# Patient Record
Sex: Male | Born: 1955
Health system: Southern US, Community
[De-identification: ages and names within clinical notes are randomized; demographics above are authoritative.]

## PROBLEM LIST (undated history)

## (undated) DIAGNOSIS — I428 Other cardiomyopathies: Secondary | ICD-10-CM

## (undated) DIAGNOSIS — I10 Essential (primary) hypertension: Secondary | ICD-10-CM

## (undated) DIAGNOSIS — J449 Chronic obstructive pulmonary disease, unspecified: Secondary | ICD-10-CM

## (undated) DIAGNOSIS — N189 Chronic kidney disease, unspecified: Secondary | ICD-10-CM

## (undated) DIAGNOSIS — I429 Cardiomyopathy, unspecified: Secondary | ICD-10-CM

## (undated) DIAGNOSIS — E119 Type 2 diabetes mellitus without complications: Secondary | ICD-10-CM

## (undated) DIAGNOSIS — D649 Anemia, unspecified: Secondary | ICD-10-CM

## (undated) DIAGNOSIS — N400 Enlarged prostate without lower urinary tract symptoms: Secondary | ICD-10-CM

## (undated) DIAGNOSIS — I509 Heart failure, unspecified: Secondary | ICD-10-CM

## (undated) HISTORY — DX: Benign prostatic hyperplasia without lower urinary tract symptoms: N40.0

## (undated) HISTORY — DX: Heart failure, unspecified: I50.9

## (undated) HISTORY — DX: Chronic kidney disease, unspecified: N18.9

## (undated) HISTORY — DX: Essential (primary) hypertension: I10

## (undated) HISTORY — DX: Anemia, unspecified: D64.9

## (undated) HISTORY — PX: TONSILLECTOMY: SUR1361

## (undated) HISTORY — PX: APPENDECTOMY: SHX54

## (undated) HISTORY — DX: Type 2 diabetes mellitus without complications: E11.9

## (undated) HISTORY — DX: Chronic obstructive pulmonary disease, unspecified: J44.9

---

## 2018-11-27 ENCOUNTER — Other Ambulatory Visit: Payer: Self-pay

## 2018-11-27 ENCOUNTER — Encounter: Payer: Self-pay | Admitting: Internal Medicine

## 2018-11-28 ENCOUNTER — Inpatient Hospital Stay: Payer: Self-pay

## 2018-11-28 ENCOUNTER — Inpatient Hospital Stay: Payer: Self-pay | Attending: Internal Medicine | Admitting: Internal Medicine

## 2018-11-28 ENCOUNTER — Encounter: Payer: Self-pay | Admitting: Internal Medicine

## 2018-11-28 ENCOUNTER — Encounter (INDEPENDENT_AMBULATORY_CARE_PROVIDER_SITE_OTHER): Payer: Self-pay

## 2018-11-28 ENCOUNTER — Other Ambulatory Visit: Payer: Self-pay

## 2018-11-28 VITALS — BP 167/90 | HR 84 | Temp 98.7°F | Resp 18 | Wt 220.0 lb

## 2018-11-28 DIAGNOSIS — R0602 Shortness of breath: Secondary | ICD-10-CM | POA: Insufficient documentation

## 2018-11-28 DIAGNOSIS — N183 Chronic kidney disease, stage 3 (moderate): Secondary | ICD-10-CM | POA: Insufficient documentation

## 2018-11-28 DIAGNOSIS — D649 Anemia, unspecified: Secondary | ICD-10-CM

## 2018-11-28 DIAGNOSIS — I129 Hypertensive chronic kidney disease with stage 1 through stage 4 chronic kidney disease, or unspecified chronic kidney disease: Secondary | ICD-10-CM | POA: Insufficient documentation

## 2018-11-28 DIAGNOSIS — R1012 Left upper quadrant pain: Secondary | ICD-10-CM | POA: Insufficient documentation

## 2018-11-28 DIAGNOSIS — R634 Abnormal weight loss: Secondary | ICD-10-CM | POA: Insufficient documentation

## 2018-11-28 DIAGNOSIS — Z79899 Other long term (current) drug therapy: Secondary | ICD-10-CM | POA: Insufficient documentation

## 2018-11-28 DIAGNOSIS — R194 Change in bowel habit: Secondary | ICD-10-CM

## 2018-11-28 DIAGNOSIS — M7989 Other specified soft tissue disorders: Secondary | ICD-10-CM | POA: Insufficient documentation

## 2018-11-28 DIAGNOSIS — E119 Type 2 diabetes mellitus without complications: Secondary | ICD-10-CM | POA: Insufficient documentation

## 2018-11-28 DIAGNOSIS — F17210 Nicotine dependence, cigarettes, uncomplicated: Secondary | ICD-10-CM | POA: Insufficient documentation

## 2018-11-28 DIAGNOSIS — R1011 Right upper quadrant pain: Secondary | ICD-10-CM | POA: Insufficient documentation

## 2018-11-28 DIAGNOSIS — R1010 Upper abdominal pain, unspecified: Secondary | ICD-10-CM

## 2018-11-28 LAB — CBC WITH DIFFERENTIAL/PLATELET
Abs Immature Granulocytes: 0.02 10*3/uL (ref 0.00–0.07)
Basophils Absolute: 0.1 10*3/uL (ref 0.0–0.1)
Basophils Relative: 1 %
Eosinophils Absolute: 0.4 10*3/uL (ref 0.0–0.5)
Eosinophils Relative: 7 %
HCT: 28.2 % — ABNORMAL LOW (ref 39.0–52.0)
Hemoglobin: 8.9 g/dL — ABNORMAL LOW (ref 13.0–17.0)
Immature Granulocytes: 0 %
Lymphocytes Relative: 26 %
Lymphs Abs: 1.7 10*3/uL (ref 0.7–4.0)
MCH: 28 pg (ref 26.0–34.0)
MCHC: 31.6 g/dL (ref 30.0–36.0)
MCV: 88.7 fL (ref 80.0–100.0)
Monocytes Absolute: 0.4 10*3/uL (ref 0.1–1.0)
Monocytes Relative: 7 %
Neutro Abs: 3.8 10*3/uL (ref 1.7–7.7)
Neutrophils Relative %: 59 %
Platelets: 202 10*3/uL (ref 150–400)
RBC: 3.18 MIL/uL — ABNORMAL LOW (ref 4.22–5.81)
RDW: 14 % (ref 11.5–15.5)
WBC: 6.3 10*3/uL (ref 4.0–10.5)
nRBC: 0 % (ref 0.0–0.2)

## 2018-11-28 LAB — LACTATE DEHYDROGENASE: LDH: 232 U/L — ABNORMAL HIGH (ref 98–192)

## 2018-11-28 LAB — C-REACTIVE PROTEIN: CRP: <0.8 mg/dL (ref <1.0)

## 2018-11-28 LAB — FOLATE: Folate: 12.7 ng/mL (ref >5.9)

## 2018-11-28 NOTE — Progress Notes (Signed)
[facility] NOTE  Patient Care Team: [name], MD as PCP - General (Internal Medicine)  CHIEF COMPLAINTS/PURPOSE OF CONSULTATION: Anemia  HEMATOLOGY HISTORY  # SEP 2020 ANEMIA Hb-9;N-wbc/platelets MCV- normal; M- spike NEg/b12-N; retic-N [pcp]  #September 2020-PCP; 2D echo ejection fraction 25%/left ventricular hypokinesis; awaiting cardiac evaluation  # CKD-III [GFR-49]/ DM-2 [sep 2020-A1c-6.7];  EGD-/ colonoscopy/capsule/ Bone marrow Biopsy-NONE  HISTORY OF PRESENTING ILLNESS:  Shawn Hayes 63 y.o.  male has been referred to [place] for further evaluation/work-up for anemia.  Patient notes to have worsening shortness of breath for the last 2 months.  Positive for orthopnea.  Also complains of bilateral extremity swelling.  Complains of bilateral upper abdominal pain progressive getting worse.  Possible constipation alternating diarrhea-again going on for the last many months.  Patient last bowel movement was 3 days ago.  About a month ago he noticed some blood in stools.  Patient never had EGD or colonoscopy.  Patient initially lost weight he is currently gaining weight.  Is not on iron supplementation.  No prior blood transfusions.  Denies any blood in urine.  Denies difficulty swallowing.   Review of Systems  Constitutional: Positive for malaise/fatigue. Negative for chills, diaphoresis, fever and weight loss.  HENT: Negative for nosebleeds and sore throat.   Eyes: Negative for double vision.  Respiratory: Positive for cough and shortness of breath. Negative for hemoptysis, sputum production and wheezing.   Cardiovascular: Positive for orthopnea, leg swelling and PND. Negative for chest pain and palpitations.  Gastrointestinal: Positive for abdominal pain. Negative for blood in stool, constipation, diarrhea, heartburn, melena, nausea and vomiting.  Genitourinary: Positive for frequency and urgency.  Musculoskeletal: Negative for back pain and  joint pain.  Skin: Negative.  Negative for itching and rash.  Neurological: Positive for weakness. Negative for dizziness, tingling, focal weakness and headaches.  Endo/Heme/Allergies: Does not bruise/bleed easily.  Psychiatric/Behavioral: Negative for depression. The patient is not nervous/anxious and does not have insomnia.     MEDICAL HISTORY:  Past Medical History:  Diagnosis Date  . Anemia   . Controlled type 2 diabetes mellitus without complication ([place])     SURGICAL HISTORY: Past Surgical History:  Procedure Laterality Date  . APPENDECTOMY    . TONSILLECTOMY      SOCIAL HISTORY: Social History   Socioeconomic History  . Marital status: Single    Spouse name: Not on file  . Number of children: Not on file  . Years of education: Not on file  . Highest education level: Not on file  Occupational History  . Not on file  Social Needs  . Financial resource strain: Not on file  . Food insecurity    Worry: Not on file    Inability: Not on file  . Transportation needs    Medical: Not on file    Non-medical: Not on file  Tobacco Use  . Smoking status: Current Every Day Smoker    Types: Cigarettes  . Tobacco comment: Pt states he quit 1 week ago  Substance and Sexual Activity  . Alcohol use: Not Currently  . Drug use: Not Currently  . Sexual activity: Not on file  Lifestyle  . Physical activity    Days per week: Not on file    Minutes per session: Not on file  . Stress: Not on file  Relationships  . Social Herbalist on phone: Not on file    Gets together: Not on file    Attends  religious service: Not on file    Active member of club or organization: Not on file    Attends meetings of clubs or organizations: Not on file    Relationship status: Not on file  . Intimate partner violence    Fear of current or ex partner: Not on file    Emotionally abused: Not on file    Physically abused: Not on file    Forced sexual activity: Not on file  Other  Topics Concern  . Not on file  Social History Narrative   Lives in [place]; self; one son lives close by; in Haematologist currently in retail. Quit smoking- sep 28th, 2020/80ppd. No alcohol.     FAMILY HISTORY: Family History  Problem Relation Age of Onset  . Cancer Brother     ALLERGIES:  has No Known Allergies.  MEDICATIONS:  Current Outpatient Medications  Medication Sig Dispense Refill  . furosemide (LASIX) 40 MG tablet Take 1 tablet by mouth daily.    Marland Kitchen glipiZIDE-metformin (METAGLIP) 5-500 MG tablet Take 1 tablet by mouth 2 (two) times daily.    . potassium chloride (KLOR-CON) 10 MEQ tablet Take 1 tablet by mouth daily.     No current facility-administered medications for this visit.     PHYSICAL EXAMINATION:   Vitals:   11/28/18 1136  BP: (!) 167/90  Pulse: 84  Resp: 18  Temp: 98.7 F (37.1 C)  SpO2: 99%   Filed Weights   11/28/18 1136  Weight: 220 lb (99.8 kg)    Physical Exam  Constitutional: He is oriented to person, place, and time and well-developed, well-nourished, and in no distress.  In a wheelchair because of shortness of breath.  HENT:  Head: Normocephalic and atraumatic.  Mouth/Throat: Oropharynx is clear and moist. No oropharyngeal exudate.  Eyes: Pupils are equal, round, and reactive to light.  Neck: Normal range of motion. Neck supple.  Cardiovascular: Normal rate and regular rhythm.  Pulmonary/Chest: No respiratory distress. He has no wheezes.  Decreased air entry bilaterally.  No wheeze or crackles.  Abdominal: Soft. Bowel sounds are normal. He exhibits no distension and no mass. There is no abdominal tenderness. There is no rebound and no guarding.  Musculoskeletal: Normal range of motion.        General: Edema present. No tenderness.     Comments: Grade 3 bilateral lower extremity edema.  Neurological: He is alert and oriented to person, place, and time.  Skin: Skin is warm.  Folliculitis-like rash noted/scabbing-torso upper  extremities.  Psychiatric: Affect normal.    LABORATORY DATA:  I have reviewed the data as listed No results found for: WBC, HGB, HCT, MCV, PLT No results for input(s): NA, K, CL, CO2, GLUCOSE, BUN, CREATININE, CALCIUM, GFRNONAA, GFRAA, PROT, ALBUMIN, AST, ALT, ALKPHOS, BILITOT, BILIDIR, IBILI in the last 8760 hours.   No results found.  Normocytic anemia # Anemia-hemoglobin 9.6 normocytic.  Unclear etiology suspect chronic kidney disease/anemia of chronic disease rather than iron deficiency.  Recommend further work-up including kappa lambda light chain ratio/folic acid LDH haptoglobin.   #Bilateral upper quadrant abdominal pain/constipation alternating with diarrhea-question malignancy.  Recommend CT scan of the abdomen pelvis noncontrast given renal failure.  #Significant dyspnea on exertion/bilateral lower extremity swelling suggestive of CHF.-2D echo suggestive of significant decrease contractility-25% ejection fraction.  On Lasix/potassium as per PCP.  Awaiting cardiology evaluation.  As per patient chest x-ray done with PCP-results not available.  #Difficulty urination/urgency frequency-prostatism symptoms.  Will need further work-up with urology after acute resolve.  #  Will call patient with results /follow-up [phone]/cell  Thank you Dr.Anderson for allowing me to participate in the care of your pleasant patient. Please do not hesitate to contact me with questions or concerns in the interim.  DISPOSITION: # labs today/ordered.  # CT ASAP # follow up TBD- Dr.B    All questions were answered. The patient knows to call the clinic with any problems, questions or concerns.      Cammie Sickle, MD 11/28/2018 12:37 PM

## 2018-11-28 NOTE — Assessment & Plan Note (Addendum)
#   Anemia-hemoglobin 9.6 normocytic.  Unclear etiology suspect chronic kidney disease/anemia of chronic disease rather than iron deficiency.  Recommend further work-up including kappa lambda light chain ratio/folic acid LDH haptoglobin.   #Bilateral upper quadrant abdominal pain/constipation alternating with diarrhea-question malignancy.  Recommend CT scan of the abdomen pelvis noncontrast given renal failure.  #Significant dyspnea on exertion/bilateral lower extremity swelling suggestive of CHF.-2D echo suggestive of significant decrease contractility-25% ejection fraction.  On Lasix/potassium as per PCP.  Awaiting cardiology evaluation.  As per patient chest x-ray done with PCP-results not available.  #Difficulty urination/urgency frequency-prostatism symptoms.  Will need further work-up with urology after acute resolve.  #Will call patient with results [id number] [phone]/cell  Thank you Dr.Anderson for allowing me to participate in the care of your pleasant patient. Please do not hesitate to contact me with questions or concerns in the interim.  DISPOSITION: # labs today/ordered.  # CT ASAP # follow up TBD- Dr.B

## 2018-11-29 ENCOUNTER — Ambulatory Visit
Admission: RE | Admit: 2018-11-29 | Discharge: 2018-11-29 | Disposition: A | Discharge status: Home / Self Care | Payer: Self-pay | Source: Ambulatory Visit | Attending: Internal Medicine | Admitting: Internal Medicine

## 2018-11-29 ENCOUNTER — Other Ambulatory Visit: Payer: Self-pay

## 2018-11-29 DIAGNOSIS — R1010 Upper abdominal pain, unspecified: Secondary | ICD-10-CM | POA: Insufficient documentation

## 2018-11-29 DIAGNOSIS — R194 Change in bowel habit: Secondary | ICD-10-CM | POA: Insufficient documentation

## 2018-11-29 DIAGNOSIS — D649 Anemia, unspecified: Secondary | ICD-10-CM | POA: Insufficient documentation

## 2018-11-29 LAB — KAPPA/LAMBDA LIGHT CHAINS
Kappa free light chain: 108.2 mg/L — ABNORMAL HIGH (ref 3.3–19.4)
Kappa, lambda light chain ratio: 1.56 (ref 0.26–1.65)
Lambda free light chains: 69.4 mg/L — ABNORMAL HIGH (ref 5.7–26.3)

## 2018-11-29 LAB — HAPTOGLOBIN: Haptoglobin: 128 mg/dL (ref 32–363)

## 2018-12-07 ENCOUNTER — Telehealth: Payer: Self-pay | Admitting: Internal Medicine

## 2018-12-07 DIAGNOSIS — D649 Anemia, unspecified: Secondary | ICD-10-CM

## 2018-12-07 NOTE — Telephone Encounter (Signed)
Spoke to patient regarding results of the CT scan-unremarkable/no clear explanation for his anemia.  Abdomen pelvis nonspecific adenopathy noted. Patient recommended to have follow-up in approximately month;  # Please schedule -MD; lab-CBC CMP; LDH; hold tube.

## 2018-12-10 NOTE — Telephone Encounter (Signed)
Labs added for 1 month

## 2018-12-10 NOTE — Addendum Note (Signed)
Addended by: Sabino Gasser on: 12/10/2018 09:03 AM   Modules accepted: Orders

## 2019-01-07 ENCOUNTER — Inpatient Hospital Stay: Payer: Self-pay | Admitting: Internal Medicine

## 2019-01-07 ENCOUNTER — Inpatient Hospital Stay: Payer: Self-pay | Attending: Internal Medicine

## 2019-01-07 NOTE — Progress Notes (Deleted)
[facility] NOTE  Patient Care Team: [name], MD as PCP - General (Internal Medicine)  CHIEF COMPLAINTS/PURPOSE OF CONSULTATION: Anemia  HEMATOLOGY HISTORY  # SEP 2020 ANEMIA Hb-9;N-wbc/platelets MCV- normal; M- spike NEg/b12-N; retic-N [pcp]  #September 2020-PCP; 2D echo ejection fraction 25%/left ventricular hypokinesis; awaiting cardiac evaluation  # CKD-III [GFR-49]/ DM-2 [sep 2020-A1c-6.7];  EGD-/ colonoscopy/capsule/ Bone marrow Biopsy-NONE  HISTORY OF PRESENTING ILLNESS:  Shawn Hayes 63 y.o.  male has been referred to [place] for further evaluation/work-up for anemia.  Patient notes to have worsening shortness of breath for the last 2 months.  Positive for orthopnea.  Also complains of bilateral extremity swelling.  Complains of bilateral upper abdominal pain progressive getting worse.  Possible constipation alternating diarrhea-again going on for the last many months.  Patient last bowel movement was 3 days ago.  About a month ago he noticed some blood in stools.  Patient never had EGD or colonoscopy.  Patient initially lost weight he is currently gaining weight.  Is not on iron supplementation.  No prior blood transfusions.  Denies any blood in urine.  Denies difficulty swallowing.   Review of Systems  Constitutional: Positive for malaise/fatigue. Negative for chills, diaphoresis, fever and weight loss.  HENT: Negative for nosebleeds and sore throat.   Eyes: Negative for double vision.  Respiratory: Positive for cough and shortness of breath. Negative for hemoptysis, sputum production and wheezing.   Cardiovascular: Positive for orthopnea, leg swelling and PND. Negative for chest pain and palpitations.  Gastrointestinal: Positive for abdominal pain. Negative for blood in stool, constipation, diarrhea, heartburn, melena, nausea and vomiting.  Genitourinary: Positive for frequency and urgency.  Musculoskeletal: Negative for back pain and  joint pain.  Skin: Negative.  Negative for itching and rash.  Neurological: Positive for weakness. Negative for dizziness, tingling, focal weakness and headaches.  Endo/Heme/Allergies: Does not bruise/bleed easily.  Psychiatric/Behavioral: Negative for depression. The patient is not nervous/anxious and does not have insomnia.     MEDICAL HISTORY:  Past Medical History:  Diagnosis Date  . Anemia   . Controlled type 2 diabetes mellitus without complication ([place])     SURGICAL HISTORY: Past Surgical History:  Procedure Laterality Date  . APPENDECTOMY    . TONSILLECTOMY      SOCIAL HISTORY: Social History   Socioeconomic History  . Marital status: Single    Spouse name: Not on file  . Number of children: Not on file  . Years of education: Not on file  . Highest education level: Not on file  Occupational History  . Not on file  Social Needs  . Financial resource strain: Not on file  . Food insecurity    Worry: Not on file    Inability: Not on file  . Transportation needs    Medical: Not on file    Non-medical: Not on file  Tobacco Use  . Smoking status: Current Every Day Smoker    Types: Cigarettes  . Tobacco comment: Pt states he quit 1 week ago  Substance and Sexual Activity  . Alcohol use: Not Currently  . Drug use: Not Currently  . Sexual activity: Not on file  Lifestyle  . Physical activity    Days per week: Not on file    Minutes per session: Not on file  . Stress: Not on file  Relationships  . Social Herbalist on phone: Not on file    Gets together: Not on file    Attends  religious service: Not on file    Active member of club or organization: Not on file    Attends meetings of clubs or organizations: Not on file    Relationship status: Not on file  . Intimate partner violence    Fear of current or ex partner: Not on file    Emotionally abused: Not on file    Physically abused: Not on file    Forced sexual activity: Not on file  Other  Topics Concern  . Not on file  Social History Narrative   Lives in [place]; self; one son lives close by; in Haematologist currently in retail. Quit smoking- sep 28th, 2020/80ppd. No alcohol.     FAMILY HISTORY: Family History  Problem Relation Age of Onset  . Cancer Brother     ALLERGIES:  has No Known Allergies.  MEDICATIONS:  Current Outpatient Medications  Medication Sig Dispense Refill  . furosemide (LASIX) 40 MG tablet Take 1 tablet by mouth daily.    Marland Kitchen glipiZIDE-metformin (METAGLIP) 5-500 MG tablet Take 1 tablet by mouth 2 (two) times daily.    . potassium chloride (KLOR-CON) 10 MEQ tablet Take 1 tablet by mouth daily.     No current facility-administered medications for this visit.     PHYSICAL EXAMINATION:   There were no vitals filed for this visit. There were no vitals filed for this visit.  Physical Exam  Constitutional: He is oriented to person, place, and time and well-developed, well-nourished, and in no distress.  In a wheelchair because of shortness of breath.  HENT:  Head: Normocephalic and atraumatic.  Mouth/Throat: Oropharynx is clear and moist. No oropharyngeal exudate.  Eyes: Pupils are equal, round, and reactive to light.  Neck: Normal range of motion. Neck supple.  Cardiovascular: Normal rate and regular rhythm.  Pulmonary/Chest: No respiratory distress. He has no wheezes.  Decreased air entry bilaterally.  No wheeze or crackles.  Abdominal: Soft. Bowel sounds are normal. He exhibits no distension and no mass. There is no abdominal tenderness. There is no rebound and no guarding.  Musculoskeletal: Normal range of motion.        General: Edema present. No tenderness.     Comments: Grade 3 bilateral lower extremity edema.  Neurological: He is alert and oriented to person, place, and time.  Skin: Skin is warm.  Folliculitis-like rash noted/scabbing-torso upper extremities.  Psychiatric: Affect normal.    LABORATORY DATA:  I have reviewed  the data as listed Lab Results  Component Value Date   WBC 6.3 11/28/2018   HGB 8.9 (L) 11/28/2018   HCT 28.2 (L) 11/28/2018   MCV 88.7 11/28/2018   PLT 202 11/28/2018   No results for input(s): NA, K, CL, CO2, GLUCOSE, BUN, CREATININE, CALCIUM, GFRNONAA, GFRAA, PROT, ALBUMIN, AST, ALT, ALKPHOS, BILITOT, BILIDIR, IBILI in the last 8760 hours.   No results found.  No problem-specific Assessment & Plan notes found for this encounter.    All questions were answered. The patient knows to call the clinic with any problems, questions or concerns.      Cammie Sickle, MD 01/07/2019 7:54 AM

## 2019-01-07 NOTE — Assessment & Plan Note (Deleted)
#   Anemia-hemoglobin 9.6 normocytic.  Unclear etiology suspect chronic kidney disease/anemia of chronic disease rather than iron deficiency.  Recommend further work-up including kappa lambda light chain ratio/folic acid LDH haptoglobin.   #Bilateral upper quadrant abdominal pain/constipation alternating with diarrhea-question malignancy.  Recommend CT scan of the abdomen pelvis noncontrast given renal failure.  #Significant dyspnea on exertion/bilateral lower extremity swelling suggestive of CHF.-2D echo suggestive of significant decrease contractility-25% ejection fraction.  On Lasix/potassium as per PCP.  Awaiting cardiology evaluation.  As per patient chest x-ray done with PCP-results not available.  #Difficulty urination/urgency frequency-prostatism symptoms.  Will need further work-up with urology after acute resolve.  #Will call patient with results [id number] [phone]/cell  Thank you Dr.Anderson for allowing me to participate in the care of your pleasant patient. Please do not hesitate to contact me with questions or concerns in the interim.  DISPOSITION: # labs today/ordered.  # CT ASAP # follow up TBD- Dr.B

## 2019-04-17 ENCOUNTER — Emergency Department: Payer: Self-pay

## 2019-04-17 ENCOUNTER — Other Ambulatory Visit: Payer: Self-pay

## 2019-04-17 ENCOUNTER — Inpatient Hospital Stay
Admission: EM | Admit: 2019-04-17 | Discharge: 2019-04-23 | DRG: 291 | Disposition: A | Discharge status: Home / Self Care | Payer: Self-pay | Attending: Internal Medicine | Admitting: Internal Medicine

## 2019-04-17 DIAGNOSIS — N179 Acute kidney failure, unspecified: Secondary | ICD-10-CM

## 2019-04-17 DIAGNOSIS — N401 Enlarged prostate with lower urinary tract symptoms: Secondary | ICD-10-CM

## 2019-04-17 DIAGNOSIS — Z79899 Other long term (current) drug therapy: Secondary | ICD-10-CM

## 2019-04-17 DIAGNOSIS — D649 Anemia, unspecified: Secondary | ICD-10-CM | POA: Diagnosis present

## 2019-04-17 DIAGNOSIS — F17210 Nicotine dependence, cigarettes, uncomplicated: Secondary | ICD-10-CM | POA: Diagnosis present

## 2019-04-17 DIAGNOSIS — Z7984 Long term (current) use of oral hypoglycemic drugs: Secondary | ICD-10-CM

## 2019-04-17 DIAGNOSIS — N183 Chronic kidney disease, stage 3 unspecified: Secondary | ICD-10-CM

## 2019-04-17 DIAGNOSIS — R52 Pain, unspecified: Secondary | ICD-10-CM

## 2019-04-17 DIAGNOSIS — Z20822 Contact with and (suspected) exposure to covid-19: Secondary | ICD-10-CM | POA: Diagnosis present

## 2019-04-17 DIAGNOSIS — G2581 Restless legs syndrome: Secondary | ICD-10-CM | POA: Diagnosis present

## 2019-04-17 DIAGNOSIS — E119 Type 2 diabetes mellitus without complications: Secondary | ICD-10-CM

## 2019-04-17 DIAGNOSIS — Z9181 History of falling: Secondary | ICD-10-CM

## 2019-04-17 DIAGNOSIS — I130 Hypertensive heart and chronic kidney disease with heart failure and stage 1 through stage 4 chronic kidney disease, or unspecified chronic kidney disease: Principal | ICD-10-CM | POA: Diagnosis present

## 2019-04-17 DIAGNOSIS — N4889 Other specified disorders of penis: Secondary | ICD-10-CM

## 2019-04-17 DIAGNOSIS — I429 Cardiomyopathy, unspecified: Secondary | ICD-10-CM

## 2019-04-17 DIAGNOSIS — R3911 Hesitancy of micturition: Secondary | ICD-10-CM | POA: Diagnosis present

## 2019-04-17 DIAGNOSIS — D631 Anemia in chronic kidney disease: Secondary | ICD-10-CM | POA: Diagnosis present

## 2019-04-17 DIAGNOSIS — Z23 Encounter for immunization: Secondary | ICD-10-CM

## 2019-04-17 DIAGNOSIS — I248 Other forms of acute ischemic heart disease: Secondary | ICD-10-CM | POA: Diagnosis present

## 2019-04-17 DIAGNOSIS — Z6831 Body mass index (BMI) 31.0-31.9, adult: Secondary | ICD-10-CM

## 2019-04-17 DIAGNOSIS — N189 Chronic kidney disease, unspecified: Secondary | ICD-10-CM

## 2019-04-17 DIAGNOSIS — E875 Hyperkalemia: Secondary | ICD-10-CM

## 2019-04-17 DIAGNOSIS — R778 Other specified abnormalities of plasma proteins: Secondary | ICD-10-CM

## 2019-04-17 DIAGNOSIS — N1832 Chronic kidney disease, stage 3b: Secondary | ICD-10-CM | POA: Diagnosis present

## 2019-04-17 DIAGNOSIS — E1122 Type 2 diabetes mellitus with diabetic chronic kidney disease: Secondary | ICD-10-CM

## 2019-04-17 DIAGNOSIS — I10 Essential (primary) hypertension: Secondary | ICD-10-CM

## 2019-04-17 DIAGNOSIS — R609 Edema, unspecified: Secondary | ICD-10-CM

## 2019-04-17 DIAGNOSIS — I5023 Acute on chronic systolic (congestive) heart failure: Secondary | ICD-10-CM | POA: Diagnosis present

## 2019-04-17 DIAGNOSIS — N5089 Other specified disorders of the male genital organs: Secondary | ICD-10-CM

## 2019-04-17 DIAGNOSIS — J441 Chronic obstructive pulmonary disease with (acute) exacerbation: Secondary | ICD-10-CM

## 2019-04-17 DIAGNOSIS — E669 Obesity, unspecified: Secondary | ICD-10-CM | POA: Diagnosis present

## 2019-04-17 DIAGNOSIS — I428 Other cardiomyopathies: Secondary | ICD-10-CM

## 2019-04-17 DIAGNOSIS — J810 Acute pulmonary edema: Secondary | ICD-10-CM

## 2019-04-17 DIAGNOSIS — I509 Heart failure, unspecified: Secondary | ICD-10-CM

## 2019-04-17 HISTORY — DX: Other cardiomyopathies: I42.8

## 2019-04-17 HISTORY — DX: Cardiomyopathy, unspecified: I42.9

## 2019-04-17 LAB — COMPREHENSIVE METABOLIC PANEL
ALT: 12 U/L (ref 0–44)
AST: 17 U/L (ref 15–41)
Albumin: 2.7 g/dL — ABNORMAL LOW (ref 3.5–5.0)
Alkaline Phosphatase: 106 U/L (ref 38–126)
Anion gap: 10 (ref 5–15)
BUN: 26 mg/dL — ABNORMAL HIGH (ref 8–23)
CO2: 19 mmol/L — ABNORMAL LOW (ref 22–32)
Calcium: 8 mg/dL — ABNORMAL LOW (ref 8.9–10.3)
Chloride: 106 mmol/L (ref 98–111)
Creatinine, Ser: 1.81 mg/dL — ABNORMAL HIGH (ref 0.61–1.24)
GFR calc Af Amer: 45 mL/min — ABNORMAL LOW (ref >60)
GFR calc non Af Amer: 39 mL/min — ABNORMAL LOW (ref >60)
Glucose, Bld: 311 mg/dL — ABNORMAL HIGH (ref 70–99)
Potassium: 4.1 mmol/L (ref 3.5–5.1)
Sodium: 135 mmol/L (ref 135–145)
Total Bilirubin: 0.5 mg/dL (ref 0.3–1.2)
Total Protein: 6.7 g/dL (ref 6.5–8.1)

## 2019-04-17 LAB — TROPONIN I (HIGH SENSITIVITY): Troponin I (High Sensitivity): 27 ng/L — ABNORMAL HIGH (ref <18)

## 2019-04-17 LAB — CBC
HCT: 30.7 % — ABNORMAL LOW (ref 39.0–52.0)
Hemoglobin: 9.5 g/dL — ABNORMAL LOW (ref 13.0–17.0)
MCH: 26.8 pg (ref 26.0–34.0)
MCHC: 30.9 g/dL (ref 30.0–36.0)
MCV: 86.7 fL (ref 80.0–100.0)
Platelets: 185 10*3/uL (ref 150–400)
RBC: 3.54 MIL/uL — ABNORMAL LOW (ref 4.22–5.81)
RDW: 15.9 % — ABNORMAL HIGH (ref 11.5–15.5)
WBC: 5 10*3/uL (ref 4.0–10.5)
nRBC: 0 % (ref 0.0–0.2)

## 2019-04-17 LAB — BRAIN NATRIURETIC PEPTIDE: B Natriuretic Peptide: 1618 pg/mL — ABNORMAL HIGH (ref 0.0–100.0)

## 2019-04-17 MED ORDER — FUROSEMIDE 10 MG/ML IJ SOLN
40.0000 mg | Freq: Once | INTRAMUSCULAR | Status: AC
  Administered 2019-04-18: 01:00:00 40 mg via INTRAVENOUS
  Filled 2019-04-17: qty 4

## 2019-04-17 NOTE — ED Triage Notes (Addendum)
PT in with co bilat leg swelling for a month, states was told by PMD it is due to fluid retention. States x 2 days has had swelling to scrotum, pt also co shob. Pt is on lasix for the same.

## 2019-04-18 ENCOUNTER — Inpatient Hospital Stay
Admit: 2019-04-18 | Discharge: 2019-04-18 | Disposition: A | Discharge status: Home / Self Care | Payer: Self-pay | Attending: Internal Medicine | Admitting: Internal Medicine

## 2019-04-18 ENCOUNTER — Encounter: Payer: Self-pay | Admitting: Emergency Medicine

## 2019-04-18 DIAGNOSIS — I5023 Acute on chronic systolic (congestive) heart failure: Secondary | ICD-10-CM

## 2019-04-18 DIAGNOSIS — N1832 Chronic kidney disease, stage 3b: Secondary | ICD-10-CM

## 2019-04-18 DIAGNOSIS — R531 Weakness: Secondary | ICD-10-CM

## 2019-04-18 DIAGNOSIS — E1122 Type 2 diabetes mellitus with diabetic chronic kidney disease: Secondary | ICD-10-CM

## 2019-04-18 DIAGNOSIS — G2581 Restless legs syndrome: Secondary | ICD-10-CM | POA: Insufficient documentation

## 2019-04-18 DIAGNOSIS — I429 Cardiomyopathy, unspecified: Secondary | ICD-10-CM

## 2019-04-18 DIAGNOSIS — I428 Other cardiomyopathies: Secondary | ICD-10-CM

## 2019-04-18 DIAGNOSIS — N183 Chronic kidney disease, stage 3 unspecified: Secondary | ICD-10-CM

## 2019-04-18 DIAGNOSIS — E119 Type 2 diabetes mellitus without complications: Secondary | ICD-10-CM

## 2019-04-18 LAB — BASIC METABOLIC PANEL
Anion gap: 6 (ref 5–15)
BUN: 26 mg/dL — ABNORMAL HIGH (ref 8–23)
CO2: 23 mmol/L (ref 22–32)
Calcium: 8.2 mg/dL — ABNORMAL LOW (ref 8.9–10.3)
Chloride: 109 mmol/L (ref 98–111)
Creatinine, Ser: 1.68 mg/dL — ABNORMAL HIGH (ref 0.61–1.24)
GFR calc Af Amer: 49 mL/min — ABNORMAL LOW (ref >60)
GFR calc non Af Amer: 42 mL/min — ABNORMAL LOW (ref >60)
Glucose, Bld: 154 mg/dL — ABNORMAL HIGH (ref 70–99)
Potassium: 3.8 mmol/L (ref 3.5–5.1)
Sodium: 138 mmol/L (ref 135–145)

## 2019-04-18 LAB — GLUCOSE, CAPILLARY
Glucose-Capillary: 147 mg/dL — ABNORMAL HIGH (ref 70–99)
Glucose-Capillary: 195 mg/dL — ABNORMAL HIGH (ref 70–99)
Glucose-Capillary: 149 mg/dL — ABNORMAL HIGH (ref 70–99)
Glucose-Capillary: 134 mg/dL — ABNORMAL HIGH (ref 70–99)

## 2019-04-18 LAB — RESPIRATORY PANEL BY RT PCR (FLU A&B, COVID)
Influenza A by PCR: NEGATIVE
Influenza B by PCR: NEGATIVE
SARS Coronavirus 2 by RT PCR: NEGATIVE

## 2019-04-18 LAB — HEMOGLOBIN A1C
Hgb A1c MFr Bld: 8 % — ABNORMAL HIGH (ref 4.8–5.6)
Mean Plasma Glucose: 182.9 mg/dL

## 2019-04-18 LAB — TROPONIN I (HIGH SENSITIVITY): Troponin I (High Sensitivity): 29 ng/L — ABNORMAL HIGH (ref <18)

## 2019-04-18 LAB — MAGNESIUM: Magnesium: 1.6 mg/dL — ABNORMAL LOW (ref 1.7–2.4)

## 2019-04-18 MED ORDER — CARVEDILOL 6.25 MG PO TABS
6.2500 mg | ORAL_TABLET | Freq: Two times a day (BID) | ORAL | Status: DC
  Administered 2019-04-18 – 2019-04-20 (×5): 6.25 mg via ORAL
  Filled 2019-04-18 (×5): qty 1

## 2019-04-18 MED ORDER — POTASSIUM CHLORIDE 20 MEQ/15ML (10%) PO SOLN
10.0000 meq | Freq: Every day | ORAL | Status: DC
  Administered 2019-04-18 – 2019-04-21 (×4): 10 meq via ORAL
  Filled 2019-04-18 (×5): qty 15

## 2019-04-18 MED ORDER — LISINOPRIL 5 MG PO TABS
5.0000 mg | ORAL_TABLET | Freq: Every day | ORAL | Status: DC
  Administered 2019-04-18 – 2019-04-21 (×4): 5 mg via ORAL
  Filled 2019-04-18 (×4): qty 1

## 2019-04-18 MED ORDER — FUROSEMIDE 10 MG/ML IJ SOLN
40.0000 mg | Freq: Two times a day (BID) | INTRAMUSCULAR | Status: DC
  Administered 2019-04-18 – 2019-04-19 (×3): 40 mg via INTRAVENOUS
  Filled 2019-04-18 (×3): qty 4

## 2019-04-18 MED ORDER — SODIUM CHLORIDE 0.9% FLUSH
3.0000 mL | Freq: Two times a day (BID) | INTRAVENOUS | Status: DC
  Administered 2019-04-18 – 2019-04-23 (×11): 3 mL via INTRAVENOUS

## 2019-04-18 MED ORDER — ENOXAPARIN SODIUM 40 MG/0.4ML ~~LOC~~ SOLN
40.0000 mg | SUBCUTANEOUS | Status: DC
  Administered 2019-04-18 – 2019-04-23 (×6): 40 mg via SUBCUTANEOUS
  Filled 2019-04-18 (×6): qty 0.4

## 2019-04-18 MED ORDER — ACETAMINOPHEN 325 MG PO TABS
650.0000 mg | ORAL_TABLET | ORAL | Status: DC | PRN
  Administered 2019-04-18 – 2019-04-22 (×3): 650 mg via ORAL
  Filled 2019-04-18 (×3): qty 2

## 2019-04-18 MED ORDER — INFLUENZA VAC SPLIT QUAD 0.5 ML IM SUSY
0.5000 mL | PREFILLED_SYRINGE | INTRAMUSCULAR | Status: AC
  Administered 2019-04-23: 13:00:00 0.5 mL via INTRAMUSCULAR
  Filled 2019-04-18: qty 0.5

## 2019-04-18 MED ORDER — LINAGLIPTIN 5 MG PO TABS
5.0000 mg | ORAL_TABLET | Freq: Every day | ORAL | Status: DC
  Administered 2019-04-19 – 2019-04-23 (×5): 5 mg via ORAL
  Filled 2019-04-18 (×5): qty 1

## 2019-04-18 MED ORDER — ROPINIROLE HCL 0.25 MG PO TABS
0.5000 mg | ORAL_TABLET | Freq: Every day | ORAL | Status: DC
  Administered 2019-04-18 – 2019-04-22 (×5): 0.5 mg via ORAL
  Filled 2019-04-18 (×7): qty 2

## 2019-04-18 MED ORDER — MAGNESIUM SULFATE 2 GM/50ML IV SOLN
2.0000 g | Freq: Once | INTRAVENOUS | Status: AC
  Administered 2019-04-18: 2 g via INTRAVENOUS
  Filled 2019-04-18: qty 50

## 2019-04-18 MED ORDER — SODIUM CHLORIDE 0.9 % IV SOLN: 250.0000 mL | INTRAVENOUS | Status: DC | PRN

## 2019-04-18 MED ORDER — NITROGLYCERIN 2 % TD OINT
1.0000 [in_us] | TOPICAL_OINTMENT | Freq: Once | TRANSDERMAL | Status: AC
  Administered 2019-04-18: 1 [in_us] via TOPICAL
  Filled 2019-04-18: qty 1

## 2019-04-18 MED ORDER — SODIUM CHLORIDE 0.9% FLUSH: 3.0000 mL | INTRAVENOUS | Status: DC | PRN

## 2019-04-18 MED ORDER — ONDANSETRON HCL 4 MG/2ML IJ SOLN: 4.0000 mg | Freq: Four times a day (QID) | INTRAMUSCULAR | Status: DC | PRN

## 2019-04-18 MED ORDER — SPIRONOLACTONE 25 MG PO TABS
25.0000 mg | ORAL_TABLET | Freq: Every day | ORAL | Status: DC
  Administered 2019-04-18 – 2019-04-21 (×4): 25 mg via ORAL
  Filled 2019-04-18 (×4): qty 1

## 2019-04-18 MED ORDER — INSULIN ASPART 100 UNIT/ML ~~LOC~~ SOLN
0.0000 [IU] | Freq: Three times a day (TID) | SUBCUTANEOUS | Status: DC
  Administered 2019-04-18: 11:00:00 3 [IU] via SUBCUTANEOUS
  Administered 2019-04-18 – 2019-04-19 (×3): 2 [IU] via SUBCUTANEOUS
  Administered 2019-04-19: 1 [IU] via SUBCUTANEOUS
  Administered 2019-04-20: 17:00:00 8 [IU] via SUBCUTANEOUS
  Administered 2019-04-20: 2 [IU] via SUBCUTANEOUS
  Administered 2019-04-20: 3 [IU] via SUBCUTANEOUS
  Administered 2019-04-21: 5 [IU] via SUBCUTANEOUS
  Administered 2019-04-21 (×2): 3 [IU] via SUBCUTANEOUS
  Administered 2019-04-22: 5 [IU] via SUBCUTANEOUS
  Administered 2019-04-22: 3 [IU] via SUBCUTANEOUS
  Administered 2019-04-22 – 2019-04-23 (×2): 8 [IU] via SUBCUTANEOUS
  Administered 2019-04-23: 13:00:00 5 [IU] via SUBCUTANEOUS
  Filled 2019-04-18 (×16): qty 1

## 2019-04-18 MED ORDER — LABETALOL HCL 5 MG/ML IV SOLN
10.0000 mg | INTRAVENOUS | Status: DC | PRN
  Administered 2019-04-18: 10 mg via INTRAVENOUS
  Filled 2019-04-18: qty 4

## 2019-04-18 NOTE — Evaluation (Addendum)
Occupational Therapy Evaluation Patient Details Name: Shawn Hayes MRN: [id number] DOB: 1955/07/29 Today's Date: 04/18/2019    History of Present Illness Pt is 64 y/o M with PMH idiopathic CMY (EF 25% on Echo 11/18/2018), moderate mitral valve regurgitation, T2DM, CKD3, and anemia. Pt presented to ED d/t SOB and LE swelling. Pt with acute on chronic HFrEF exacerbation.   Clinical Impression   Pt was seen for OT evaluation this date. Prior to hospital admission, pt was Indep with ADLs/IADLs including still driving and working. Pt does endorse decline in tolerance and some falls starting in Feb 2020 per his report. 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 Pt has brother and sister-in-law who live nearby and do check on pt intermittently. Currently pt demonstrates impairments as described below (See OT problem list) which functionally limit his ability to perform ADL/self-care tasks. Pt currently requires MIN A with some UB/LB ADLs that require more dynamic reaching such as threading socks in EOB sitting and requires CGA for ADL transfers/static standing with RW.  Pt with general decreased fxl activity tolerance noted and requires extended time with all aspects of assessment (self care and mobility). Pt would benefit from skilled OT to address noted impairments and functional limitations (see below for any additional details) in order to maximize safety and independence while minimizing falls risk and caregiver burden. Upon hospital discharge, recommend HHOT to maximize pt safety and return to functional independence during meaningful occupations of daily life.     Follow Up Recommendations  Home health OT;Supervision - Intermittent    Equipment Recommendations  3 in 1 bedside commode;Tub/shower seat(grab bar in shower, removable shower head)    Recommendations for Other Services       Precautions / Restrictions Precautions Precautions:  Fall Restrictions Weight Bearing Restrictions: No      Mobility Bed Mobility Overal bed mobility: Modified Independent             General bed mobility comments: extended time required for sup<>sit in ED stretcher  Transfers Overall transfer level: Needs assistance Equipment used: Rolling walker (2 wheeled) Transfers: Sit to/from Stand Sit to Stand: Min guard              Balance Overall balance assessment: Needs assistance   Sitting balance-Leahy Scale: Good       Standing balance-Leahy Scale: Fair Standing balance comment: requires CGA and at least support of one UE for static standing balance. Some instability detected, no gross LOB                           ADL either performed or assessed with clinical judgement   ADL                                         General ADL Comments: Pt requires MIN A for UB bathing in EOB sitting, MOD I for self feeding (requires extended time d/t low tolerance for any fxl task), Pt requires extended time and MIN A for LB dressing in EOB sitting to thread socks. And requires CGA with RW for ADL transfers, demos some instability in static standing, but no gross LOB.     Vision Patient Visual Report: No change from baseline       Perception     Praxis      Pertinent Vitals/Pain  Pain Assessment: 0-10 Pain Score: 5  Pain Location: LEs-swollen Pain Descriptors / Indicators: Aching;Cramping Pain Intervention(s): Limited activity within patient's tolerance;Monitored during session     Hand Dominance     Extremity/Trunk Assessment Upper Extremity Assessment Upper Extremity Assessment: Overall WFL for tasks assessed   Lower Extremity Assessment Lower Extremity Assessment: Defer to PT evaluation;Generalized weakness       Communication Communication Communication: No difficulties   Cognition Arousal/Alertness: Awake/alert Behavior During Therapy: WFL for tasks assessed/performed Overall  Cognitive Status: Within Functional Limits for tasks assessed                                     General Comments       Exercises Other Exercises Other Exercises: OT facilitates education re: role of OT in acute setting and importance of OOB activity including reducing risk for skin breakdown and reducing risk of opportunistic infection. Pt verbalized understanding.   Shoulder Instructions      Home Living Family/patient expects to be discharged to:: Private residence Living Arrangements: Alone Available Help at Discharge: Other (Comment)(states that he has brother and sister-in-law that live close and do check on him, states sister-in-law drove him to hospital. 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 Pt does endorse struggling more in recent weeks-struggling to get out of bathtub, struggling to get into/out of trucks at work. Does endorse some falls (2-3 since Feb last year when he reports decline started).        OT Problem List: Decreased strength;Decreased activity tolerance;Impaired balance (sitting and/or standing);Decreased knowledge of use of DME or AE;Pain      OT Treatment/Interventions: Self-care/ADL training;Therapeutic exercise;Energy conservation;DME and/or AE instruction;Therapeutic activities;Patient/family education;Balance training    OT Goals(Current goals can be found in the care plan section) Acute Rehab OT Goals Patient Stated Goal: to feel more confident with  mobility and feel better overall OT Goal Formulation: With patient Time For Goal Achievement: 05/02/19 Potential to Achieve Goals: Good  OT Frequency: Min 2X/week   Barriers to D/C: Decreased caregiver support          Co-evaluation              AM-PAC OT "6 Clicks" Daily Activity     Outcome Measure Help from another person eating meals?: None Help from another person taking care of personal grooming?: A Little Help from another person toileting, which includes using toliet, bedpan, or urinal?: A Little Help from another person bathing (including washing, rinsing, drying)?: A Little Help from another person to put on and taking off regular upper body clothing?: None Help from another person to put on and taking off regular lower body clothing?: A Little 6 Click Score: 20   End of Session Equipment Utilized During Treatment: Gait belt;Rolling walker Nurse Communication: Mobility status  Activity Tolerance: Patient tolerated treatment well;Patient limited by fatigue  Patient left: in bed;with call bell/phone within reach;Other (comment)(with staff presenting to complete Echo)  OT Visit Diagnosis: Unsteadiness on feet (R26.81);Muscle weakness (generalized) (M62.81)                Time: [id number] OT Time Calculation (min): 39 min Charges:  OT General Charges $OT Visit: 1 Visit OT Evaluation $OT Eval Moderate Complexity: 1 Mod OT Treatments $Self Care/Home Management : 8-22 mins $Therapeutic Activity: 8-22 mins  Gerrianne Scale, MS, OTR/L ascom [phone] 04/18/19, 12:04 PM

## 2019-04-18 NOTE — H&P (Signed)
History and Physical    Shawn Hayes [id number] DOB: September 06, 1955 DOA: 04/17/2019  PCP: [name], MD  Patient coming from: Home  I have personally briefly reviewed patient's old medical records in [place]  Chief Complaint: Shortness of breath, swelling  HPI: Shawn Hayes is a 64 y.o. male with medical history significant for idiopathic cardiomyopathy (EF 25% by echocardiogram 11/18/2018), moderate mitral regurgitation, type 2 diabetes, CKD stage III, and anemia who presents to the ED for evaluation of shortness of breath.  Patient states he has been having progressive shortness of breath and swelling in his feet and legs ongoing for several months now.  Symptoms significantly worsened over the last 2 days.  He has been having associated orthopnea, paroxysmal nocturnal dyspnea, and scrotal edema.  He feels he is having decreased urine output than what he expects over the last week.  He normally takes Lasix 40 mg daily but states he does occasionally have days where he does not take any of his medications.  He reports occasional palpitations.  He denies any chest pain.  He reports having generalized weakness and does report a fall yesterday without significant injury.  He says he has recently been feeling dehydrated at night and drinks 4-5 16 ounce bottles of water at night.  ED Course:  Initial vitals showed BP 172/93, pulse 89, RR 20, temp 97.5 Fahrenheit, SPO2 100% on room air.  Labs notable for WBC 5.0, hemoglobin 9.5, platelets 185,000, BUN 26, creatinine 1.81 (1.7 on 02/05/2019), sodium 135, potassium 4.1, bicarb 19, serum glucose 311, BNP 1618, high-sensitivity troponin I 27 >> 29.  2 view chest x-ray shows cardiomegaly with diffuse interstitial pulmonary edema and small bilateral pleural effusions.  SARS-CoV-2 PCR test is ordered and pending.  Patient was given IV Lasix 40 mg once and topical nitroglycerin for elevated blood pressure.  The hospitalist  service was consulted to admit for further evaluation and management.  Review of Systems: All systems reviewed and are negative except as documented in history of present illness above.   Past Medical History:  Diagnosis Date  . Anemia   . Controlled type 2 diabetes mellitus without complication ([place])   . Idiopathic cardiomyopathy ([place])     Past Surgical History:  Procedure Laterality Date  . APPENDECTOMY    . TONSILLECTOMY      Social History:  reports that he has been smoking cigarettes. He has never used smokeless tobacco. He reports previous alcohol use. He reports previous drug use.  No Known Allergies  Family History  Problem Relation Age of Onset  . Cancer Brother      Prior to Admission medications   Medication Sig Start Date End Date Taking? Authorizing Provider  furosemide (LASIX) 40 MG tablet Take 1 tablet by mouth daily. 11/15/18 11/15/19  [provider]  glipiZIDE-metformin (METAGLIP) 5-500 MG tablet Take 1 tablet by mouth 2 (two) times daily. 11/15/18 11/15/19  [provider]  potassium chloride (KLOR-CON) 10 MEQ tablet Take 1 tablet by mouth daily. 11/15/18 11/15/19  [provider]    Physical Exam: Vitals:   04/17/19 2114  BP: (!) 172/93  Pulse: 89  Resp: 20  Temp: (!) 97.5 F (36.4 C)  TempSrc: Oral  SpO2: 100%  Weight: 100.2 kg  Height: 6\' 1"  (1.854 m)   Constitutional: Obese man resting supine in bed, head slightly elevated, NAD, calm, somewhat anxious Eyes: PERRL, lids and conjunctivae normal ENMT: Mucous membranes are moist. Posterior pharynx clear of any  exudate or lesions. Neck: normal, supple, no masses. Respiratory: Bibasilar inspiratory crackles..  Slight increased respiratory effort. No accessory muscle use.  Cardiovascular: Regular rate and rhythm, no murmurs / rubs / gallops. +3 bilateral lower extremity edema. Abdomen: no tenderness, no masses palpated. No hepatosplenomegaly. Bowel sounds positive.    Musculoskeletal: Tender to palpation bilateral chest wall.  No clubbing / cyanosis. No joint deformity upper and lower extremities. Good ROM, no contractures. Normal muscle tone.  Skin: no rashes, lesions, ulcers. No induration Neurologic: CN 2-12 grossly intact. Sensation intact, Strength 5/5 in all 4.  Psychiatric: Alert and oriented x 3. Normal mood.     Labs on Admission: I have personally reviewed following labs and imaging studies  CBC: Recent Labs  Lab 04/17/19 2121  WBC 5.0  HGB 9.5*  HCT 30.7*  MCV 86.7  PLT 123XX123   Basic Metabolic Panel: Recent Labs  Lab 04/17/19 2121  NA 135  K 4.1  CL 106  CO2 19*  GLUCOSE 311*  BUN 26*  CREATININE 1.81*  CALCIUM 8.0*   GFR: Estimated Creatinine Clearance: 51.3 mL/min (A) (by C-G formula based on SCr of 1.81 mg/dL (H)). Liver Function Tests: Recent Labs  Lab 04/17/19 2121  AST 17  ALT 12  ALKPHOS 106  BILITOT 0.5  PROT 6.7  ALBUMIN 2.7*   No results for input(s): LIPASE, AMYLASE in the last 168 hours. No results for input(s): AMMONIA in the last 168 hours. Coagulation Profile: No results for input(s): INR, PROTIME in the last 168 hours. Cardiac Enzymes: No results for input(s): CKTOTAL, CKMB, CKMBINDEX, TROPONINI in the last 168 hours. BNP (last 3 results) No results for input(s): PROBNP in the last 8760 hours. HbA1C: No results for input(s): HGBA1C in the last 72 hours. CBG: No results for input(s): GLUCAP in the last 168 hours. Lipid Profile: No results for input(s): CHOL, HDL, LDLCALC, TRIG, CHOLHDL, LDLDIRECT in the last 72 hours. Thyroid Function Tests: No results for input(s): TSH, T4TOTAL, FREET4, T3FREE, THYROIDAB in the last 72 hours. Anemia Panel: No results for input(s): VITAMINB12, FOLATE, FERRITIN, TIBC, IRON, RETICCTPCT in the last 72 hours. Urine analysis: No results found for: COLORURINE, APPEARANCEUR, LABSPEC, PHURINE, GLUCOSEU, HGBUR, BILIRUBINUR, KETONESUR, PROTEINUR, UROBILINOGEN,  NITRITE, LEUKOCYTESUR  Radiological Exams on Admission: DG Chest 2 View  Result Date: 04/17/2019 CLINICAL DATA:  64 year old current smoker with an approximate 1 month history of BILATERAL lower extremity edema and a 2 day history of scrotal edema and shortness of breath. EXAM: CHEST - 2 VIEW COMPARISON:  None. FINDINGS: Cardiac silhouette markedly enlarged. Thoracic aorta mildly tortuous. Hilar and mediastinal contours otherwise unremarkable. Pulmonary venous hypertension and mild diffuse interstitial pulmonary edema. No confluent airspace consolidation. Small pleural effusions visible on the lateral image posteriorly. Visualized bony thorax intact. IMPRESSION: Mild CHF, with marked cardiomegaly and mild diffuse interstitial pulmonary edema. Small bilateral pleural effusions. Electronically Signed   By: Evangeline Dakin M.D.   On: 04/17/2019 21:42    EKG: Independently reviewed. Normal sinus rhythm, motion artifact present.  No prior for comparison.  Assessment/Plan Principal Problem:   Acute on chronic HFrEF (heart failure with reduced ejection fraction) (HCC) Active Problems:   Normocytic anemia   Idiopathic cardiomyopathy (HCC)   Controlled type 2 diabetes mellitus without complication (HCC)   CKD (chronic kidney disease) stage 3, GFR 30-59 ml/min  Shawn Hayes is a 64 y.o. male with medical history significant for idiopathic cardiomyopathy (EF 25% by echocardiogram 11/18/2018), moderate mitral regurgitation, type 2 diabetes, CKD stage  III, and anemia who is admitted with acute on chronic HFrEF exacerbation.  Acute on chronic HFrEF exacerbation: Patient with idiopathic cardiomyopathy, EF 25% by echocardiogram 11/18/2018.  No reversible ischemia on stress test 01/10/2019.  Follows with cardiology, Dr. Ubaldo Glassing, as an outpatient who recommended further evaluation with cardiac MRI versus cardiac cath, patient deferred per documentation. -Continue IV Lasix 40 mg twice daily -Continue Coreg  6.125 mg twice daily, lisinopril 5 mg daily, spironolactone 25 mg daily -Obtain echocardiogram -Monitor strict I/O's, daily weights  CKD stage III: Appears stable relative to recent labs.  Continue to monitor closely with diuresis.  Normocytic anemia: Chronic and stable without obvious bleeding.  Suspect anemia of chronic disease.  Has been seen by oncology as an outpatient.  Type 2 diabetes: Holding home glipizide-Metformin while in hospital.  Continue moderate SSI and adjust if needed.  Check A1c.  Generalized weakness/deconditioning: With reported fall at home without significant injury.  Will request PT/OT eval.  DVT prophylaxis: Lovenox Code Status: Full code, confirmed with patient Family Communication: Discussed with patient, he has discussed with family Disposition Plan: Pending adequate diuresis, cardiac work-up, and PT/OT evaluation. Consults called: None Admission status: Inpatient, patient likely requires greater than 2 midnight length stay for management of acute on chronic HFrEF exacerbation for continued IV diuresis and reassessment of cardiac function as he is high risk for further decompensation and respiratory failure.   Zada Finders MD [facility]  If 7PM-7AM, please contact night-coverage [email or web address]  04/18/2019, 12:29 AM

## 2019-04-18 NOTE — Progress Notes (Signed)
PT Cancellation Note  Patient Details Name: JAYCO PRAJAPATI MRN: [id number] DOB: 07-Mar-1955   Cancelled Treatment:    Reason Eval/Treat Not Completed: (Consult received and chart reviewed. Patient currently with physician for rounding/assessment.  Will re-attempt at later time/date as medically appropriate and available.)   Stavros Cail H. Owens Shark, PT, DPT, NCS 04/18/19, 12:11 PM [phone]

## 2019-04-18 NOTE — Progress Notes (Signed)
Patients home medications sealed per protocol and delivered by nurse to pharmacy. Patient aware, documentation in shadow chart.

## 2019-04-18 NOTE — Progress Notes (Signed)
*  PRELIMINARY RESULTS* Echocardiogram 2D Echocardiogram has been performed.  Shawn Hayes 04/18/2019, 10:37 AM

## 2019-04-18 NOTE — Progress Notes (Signed)
Pt admitted to 2A, oriented to room, pt expressed no needs at this time.

## 2019-04-18 NOTE — ED Provider Notes (Signed)
[facility] Emergency Department Provider Note  ____________________________________________   First MD Initiated Contact with Patient 04/17/19 2326     (approximate)  I have reviewed the triage vital signs and the nursing notes.   HISTORY  Chief Complaint Leg Swelling    HPI Shawn Hayes is a 64 y.o. male with medical history as listed below who reports a history of heart failure even though it does not specifically show up in the record although he does have a history of idiopathic cardiomyopathy and has been seen by Dr. Ubaldo Glassing.  He also states he has been to the heart failure clinic in the past.  He presents tonight for worsening shortness of breath, bilateral leg swelling, and now with severe scrotal edema over the last 2 days.  He said he is supposed to be taking Lasix 40 mg every day but some days he forgets and then other days he takes it more than 1 tablet.  His shortness of breath is been going on for more than a month but has gotten quite severe.  It is worse when he lies flat and sometimes he has to sit up straight in order to breathe.  His legs are chronically swollen but they are getting worse.  He denies fever/chills, sore throat, chest pain, cough, nausea, vomiting, and abdominal pain.  He has no dysuria.  He reports the symptoms are severe and he sees Dr. Ouida Sills for his primary care who has been working with him to control the heart failure symptoms.         Past Medical History:  Diagnosis Date  . Anemia   . Controlled type 2 diabetes mellitus without complication ([place])   . Idiopathic cardiomyopathy [facility])     Patient Active Problem List   Diagnosis Date Noted  . Normocytic anemia 11/28/2018    Past Surgical History:  Procedure Laterality Date  . APPENDECTOMY    . TONSILLECTOMY      Prior to Admission medications   Medication Sig Start Date End Date Taking? Authorizing Provider  furosemide (LASIX) 40 MG tablet Take 1 tablet  by mouth daily. 11/15/18 11/15/19  [provider]  glipiZIDE-metformin (METAGLIP) 5-500 MG tablet Take 1 tablet by mouth 2 (two) times daily. 11/15/18 11/15/19  [provider]  potassium chloride (KLOR-CON) 10 MEQ tablet Take 1 tablet by mouth daily. 11/15/18 11/15/19  [provider]    Allergies Patient has no known allergies.  Family History  Problem Relation Age of Onset  . Cancer Brother     Social History Social History   Tobacco Use  . Smoking status: Current Every Day Smoker    Types: Cigarettes  . Smokeless tobacco: Never Used  . Tobacco comment: Pt states he quit 1 week ago  Substance Use Topics  . Alcohol use: Not Currently  . Drug use: Not Currently    Review of Systems Constitutional: No fever/chills.  Generalized weakness and fatigue. Eyes: No visual changes. ENT: No sore throat. Cardiovascular: Denies chest pain. Respiratory: +shortness of breath. Gastrointestinal: No abdominal pain.  No nausea, no vomiting.  No diarrhea.  No constipation.  Genitourinary: Extensive scrotal edema.  Negative for dysuria. Musculoskeletal: Extensive peripheral edema.  Negative for neck pain.  Negative for back pain. Integumentary: Negative for rash. Neurological: Negative for headaches, focal weakness or numbness.   ____________________________________________   PHYSICAL EXAM:  VITAL SIGNS: ED Triage Vitals [04/17/19 2114]  Enc Vitals Group     BP (!) 172/93  Pulse Rate 89     Resp 20     Temp (!) 97.5 F (36.4 C)     Temp Source Oral     SpO2 100 %     Weight 100.2 kg (221 lb)     Height 1.854 m (6\' 1" )     Head Circumference      Peak Flow      Pain Score 5     Pain Loc      Pain Edu?      Excl. in [place]?     Constitutional: Alert and oriented.  Appears uncomfortable. Eyes: Conjunctivae are normal.  Head: Atraumatic. Nose: No congestion/rhinnorhea. Mouth/Throat: Patient is wearing a mask. Neck: No stridor.  No meningeal signs.     Cardiovascular: Normal rate, regular rhythm. Good peripheral circulation. Grossly normal heart sounds. Respiratory: Increased respiratory effort with some retractions and mild tachypnea.  Coarse breath sounds throughout.  Patient needs to be at least semirecumbent in order to be comfortable breathing. Gastrointestinal: Soft and nontender. No distention.  GU: No evidence of infection, no crepitus, no erythema suggestive of scrotal cellulitis, but the patient has extensive scrotal and penile edema consistent with volume overload.  No significant tenderness to palpation. Musculoskeletal: Extensive peripheral edema that is at least 2+.  No evidence of acute infection but with chronic skin changes. Neurologic:  Normal speech and language. No gross focal neurologic deficits are appreciated.  Skin:  Skin is warm, dry and intact. Psychiatric: Mood and affect are normal. Speech and behavior are normal.  ____________________________________________   LABS (all labs ordered are listed, but only abnormal results are displayed)  Labs Reviewed  CBC - Abnormal; Notable for the following components:      Result Value   RBC 3.54 (*)    Hemoglobin 9.5 (*)    HCT 30.7 (*)    RDW 15.9 (*)    All other components within normal limits  COMPREHENSIVE METABOLIC PANEL - Abnormal; Notable for the following components:   CO2 19 (*)    Glucose, Bld 311 (*)    BUN 26 (*)    Creatinine, Ser 1.81 (*)    Calcium 8.0 (*)    Albumin 2.7 (*)    GFR calc non Af Amer 39 (*)    GFR calc Af Amer 45 (*)    All other components within normal limits  BRAIN NATRIURETIC PEPTIDE - Abnormal; Notable for the following components:   B Natriuretic Peptide 1,618.0 (*)    All other components within normal limits  TROPONIN I (HIGH SENSITIVITY) - Abnormal; Notable for the following components:   Troponin I (High Sensitivity) 27 (*)    All other components within normal limits  RESPIRATORY PANEL BY RT PCR (FLU A&B, COVID)   TROPONIN I (HIGH SENSITIVITY)   ____________________________________________  EKG  ED ECG REPORT I, Hinda Kehr, the attending physician, personally viewed and interpreted this ECG.  Date: 04/17/2019 EKG Time: 21: 16 Rate: 90 Rhythm: normal sinus rhythm QRS Axis: normal Intervals: normal ST/T Wave abnormalities: Non-specific ST segment / T-wave changes, but no clear evidence of acute ischemia. Narrative Interpretation: no definitive evidence of acute ischemia; does not meet STEMI criteria.   ____________________________________________  RADIOLOGY I, Hinda Kehr, personally viewed and evaluated these images (plain radiographs) as part of my medical decision making, as well as reviewing the written report by the radiologist.  ED MD interpretation: CHF with cardiomegaly and interstitial pulmonary edema and small bilateral pleural effusions.  Official radiology report(s): DG  Chest 2 View  Result Date: 04/17/2019 CLINICAL DATA:  64 year old current smoker with an approximate 1 month history of BILATERAL lower extremity edema and a 2 day history of scrotal edema and shortness of breath. EXAM: CHEST - 2 VIEW COMPARISON:  None. FINDINGS: Cardiac silhouette markedly enlarged. Thoracic aorta mildly tortuous. Hilar and mediastinal contours otherwise unremarkable. Pulmonary venous hypertension and mild diffuse interstitial pulmonary edema. No confluent airspace consolidation. Small pleural effusions visible on the lateral image posteriorly. Visualized bony thorax intact. IMPRESSION: Mild CHF, with marked cardiomegaly and mild diffuse interstitial pulmonary edema. Small bilateral pleural effusions. Electronically Signed   By: Evangeline Dakin M.D.   On: 04/17/2019 21:42    ____________________________________________   PROCEDURES   Procedure(s) performed (including Critical Care):  Procedures   ____________________________________________   INITIAL IMPRESSION / MDM / [place] / ED COURSE  As part of my medical decision making, I reviewed the following data within the [place] notes reviewed and incorporated, Labs reviewed , EKG interpreted , Old chart reviewed, Radiograph reviewed , Discussed with admitting physician  and Notes from prior ED visits   Differential diagnosis includes, but is not limited to, heart failure exacerbation, COVID-19, COPD, ACS.  The patient's presentation is strongly suggestive of a heart failure exacerbation.  I do not see any echocardiogram results in the system that I see a visit with cardiology a few months ago (Dr. Ubaldo Glassing) which mentioned the patient's ischemic cardiomyopathy.  I believe this is likely worsened which accounts for his generalized weakness and fatigue as well as his volume overload.  He has pulmonary edema, severe peripheral edema, and now scrotal and penile edema.  He is not hypoxemic which is reassuring but he is having increased work of breathing and cannot recline more than a semirecumbent position.  He has not yet requiring BiPAP but he may progress to that point.  I am initiating treatment with furosemide 40 mg IV and nitroglycerin 1 inch paste on his chest wall.  His vital signs are stable and his initial blood pressure was 172/93 which is elevated but not consistent with hypertensive emergency.  Lab work is notable for a creatinine of 1.8 which is consistent with his creatinine from 2 months ago within the [facility] system of 1.7.  Electrolytes are within normal limits, LFTs are normal, CBC is normal, BNP is elevated at 1618.  COVID-19 panel is pending given his shortness of breath but I strongly doubt that his symptoms are infectious at this point.  I have consulted the hospitalist for admission given the severity of the patient's volume overload and need for careful in and out monitoring, echocardiogram, and the possibility he may decompensate and require BiPAP if he is discharged for  outpatient follow-up.   Clinical Course as of Apr 17 300  Thu Apr 18, 2019  0027 Discussed by phone with Dr. Posey Pronto of the hospitalist service who will admit.   [CF]  0302 SARS Coronavirus 2 by RT PCR: NEGATIVE [CF]    Clinical Course User Index [CF] Hinda Kehr, MD     ____________________________________________  FINAL CLINICAL IMPRESSION(S) / ED DIAGNOSES  Final diagnoses:  Acute on chronic congestive heart failure, unspecified heart failure type ([place])  Scrotal edema  Penile edema  Peripheral edema  Acute pulmonary edema (HCC)  Chronic kidney disease, unspecified CKD stage  Demand ischemia (HCC)  Elevated troponin level     MEDICATIONS GIVEN DURING THIS VISIT:  Medications  sodium chloride flush (NS) 0.9 %  injection 3 mL (3 mLs Intravenous Not Given 04/18/19 0200)  sodium chloride flush (NS) 0.9 % injection 3 mL (has no administration in time range)  0.9 %  sodium chloride infusion (has no administration in time range)  acetaminophen (TYLENOL) tablet 650 mg (has no administration in time range)  ondansetron (ZOFRAN) injection 4 mg (has no administration in time range)  enoxaparin (LOVENOX) injection 40 mg (has no administration in time range)  furosemide (LASIX) injection 40 mg (has no administration in time range)  potassium chloride 20 MEQ/15ML (10%) solution 10 mEq (has no administration in time range)  insulin aspart (novoLOG) injection 0-15 Units (has no administration in time range)  carvedilol (COREG) tablet 6.25 mg (has no administration in time range)  lisinopril (ZESTRIL) tablet 5 mg (has no administration in time range)  spironolactone (ALDACTONE) tablet 25 mg (has no administration in time range)  labetalol (NORMODYNE) injection 10 mg (10 mg Intravenous Given 04/18/19 0225)  furosemide (LASIX) injection 40 mg (40 mg Intravenous Given 04/18/19 0044)  nitroGLYCERIN (NITROGLYN) 2 % ointment 1 inch (1 inch Topical Given 04/18/19 0044)     ED Discharge  Orders    None      *Please note:  DURELLE HOLMS was evaluated in Emergency Department on 04/18/2019 for the symptoms described in the history of present illness. He was evaluated in the context of the global COVID-19 pandemic, which necessitated consideration that the patient might be at risk for infection with the SARS-CoV-2 virus that causes COVID-19. Institutional protocols and algorithms that pertain to the evaluation of patients at risk for COVID-19 are in a state of rapid change based on information released by regulatory bodies including the CDC and federal and state organizations. These policies and algorithms were followed during the patient's care in the ED.  Some ED evaluations and interventions may be delayed as a result of limited staffing during the pandemic.*  Note:  This document was prepared using Dragon voice recognition software and may include unintentional dictation errors.   Hinda Kehr, MD 04/18/19 [phone]

## 2019-04-18 NOTE — TOC Initial Note (Signed)
Transition of Care [facility]) - Initial/Assessment Note    Patient Details  Name: Shawn Hayes MRN: [id number] Date of Birth: Sep 11, 1955  Transition of Care [facility]) CM/SW Contact:    Shawn Hayes, [place] Phone Number: 04/18/2019, 5:31 PM  Clinical Narrative:                 Patient is currently living alone in his apartment with his dog.  Patient stated that has been having issues with his balance for the last two months, but possibly longer.  Patient states "my legs get weak and then I fall."  Patient states there is not prior warning when this occurs. Patient was having intermittent abdominal pain and was unable to concentrate fully on our conversation.  Patient did state he would rather go home and receive home health, rather than go to a facility.  Patient stated he has a dog in the home and wants to go back to the dog.  This SW spoke with the patient's brother Shawn Hayes [phone], for collateral information.  Shawn Hayes stated the patient has been having balance issues for at least 6 months.  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  Shawn Hayes stated the apartment is very small and the patient does not maintain it clean.  Shawn Hayes explained the patient cannot stay with him but he is willing to assist the patient in getting an apartment closer to town.  Shawn Hayes stated he will do anything he can to help the patient, "my brother is my best friend and I'll do anything I can to help him get better.  I want him to be around as long as I am."  Expected Discharge Plan: [place]     Patient Goals and CMS Choice Patient states their goals for this hospitalization and ongoing recovery are:: To go return home and get home health.  Patient stated he does not want to go to a facility.      Expected Discharge Plan and Services Expected Discharge Plan: [place]  Choice: [place] arrangements for the past 2 months: Apartment(Patient's apartment is small.  Patient's brother stated the apartment is "full of stuff and not very clean.")                                      Prior Living Arrangements/Services Living arrangements for the past 2 months: Apartment(Patient's apartment is small.  Patient's brother stated the apartment is "full of stuff and not very clean.") Lives with:: Self(Patient has a dog.) Patient language and need for interpreter reviewed:: Yes Do you feel safe going back to the place where you live?: Yes            Criminal Activity/Legal Involvement Pertinent to Current Situation/Hospitalization: No - Comment as needed  Activities of Daily Living      Permission Sought/Granted                  Emotional Assessment Appearance:: Appears stated age, Disheveled Attitude/Demeanor/Rapport: Unable to Assess(Patietn was experiencing abdominal pain when this SW spoke with him.) Affect (typically observed): Unable to Assess Orientation: : Oriented to Self, Oriented to Place, Oriented to Situation Alcohol / Substance Use: Not Applicable    Admission diagnosis:  Acute on chronic  HFrEF (heart failure with reduced ejection fraction) ([place]) [I50.23] Patient Active Problem List   Diagnosis Date Noted  . Acute on chronic HFrEF (heart failure with reduced ejection fraction) ([place]) 04/18/2019  . Idiopathic cardiomyopathy ([place])   . Controlled type 2 diabetes mellitus without complication ([place])   . CKD stage 3 due to type 2 diabetes mellitus ([place])   . Weakness   . RLS (restless legs syndrome)   . Normocytic anemia 11/28/2018   PCP:  Shawn Ruths, MD Pharmacy:   [facility] [address]), [place] [address] [place]) [place] [address] Phone: [phone] Fax: [phone]     Social Determinants of Health (SDOH) Interventions    Readmission Risk  Interventions No flowsheet data found.

## 2019-04-18 NOTE — Progress Notes (Signed)
Report called to [place] on 2A. Patient to be transported to room.

## 2019-04-18 NOTE — Progress Notes (Signed)
Patient ID: Shawn Hayes, male   DOB: January 11, 1956, 64 y.o.   MRN: [id number] [facility] PROGRESS NOTE  IBRAHIMA ZARZA [id number] DOB: 1955/03/15 DOA: 04/17/2019 PCP: [name], MD  HPI/Subjective: Patient feels okay.  Coming in with leg swelling going on for 1 month and some shortness of breath.  Patient also feels weak.  Complains of leg shaking a lot.  Objective: Vitals:   04/18/19 0830 04/18/19 0900  BP: 137/84 (!) 145/99  Pulse:  71  Resp: 18 15  Temp:    SpO2:  98%    Intake/Output Summary (Last 24 hours) at 04/18/2019 1548 Last data filed at 04/18/2019 [id number] Gross per 24 hour  Intake 200 ml  Output 1650 ml  Net -1450 ml   Filed Weights   04/17/19 2114  Weight: 100.2 kg    ROS: Review of Systems  Constitutional: Negative for chills and fever.  Eyes: Negative for blurred vision.  Respiratory: Positive for shortness of breath. Negative for cough.   Cardiovascular: Negative for chest pain.  Gastrointestinal: Negative for abdominal pain, constipation, diarrhea, nausea and vomiting.  Genitourinary: Negative for dysuria.  Musculoskeletal: Positive for joint pain.  Neurological: Negative for dizziness and headaches.   Exam: Physical Exam  Constitutional: He is oriented to person, place, and time.  HENT:  Nose: No mucosal edema.  Mouth/Throat: No oropharyngeal exudate or posterior oropharyngeal edema.  Eyes: Conjunctivae and lids are normal.  Neck: Carotid bruit is not present.  Cardiovascular: S1 normal and S2 normal. Exam reveals no gallop.  No murmur heard. Respiratory: No respiratory distress. He has decreased breath sounds in the right lower field and the left lower field. He has no wheezes. He has no rhonchi. He has no rales.  GI: Soft. Bowel sounds are normal. There is no abdominal tenderness.  Musculoskeletal:     Right ankle: Swelling present.     Left ankle: Swelling present.  Lymphadenopathy:    He has no cervical adenopathy.   Neurological: He is alert and oriented to person, place, and time. No cranial nerve deficit.  Skin: Skin is warm. Nails show no clubbing.  Chronic lower extremity skin discoloration  Psychiatric: He has a normal mood and affect.      Data Reviewed: Basic Metabolic Panel: Recent Labs  Lab 04/17/19 2121 04/18/19 0441  NA 135 138  K 4.1 3.8  CL 106 109  CO2 19* 23  GLUCOSE 311* 154*  BUN 26* 26*  CREATININE 1.81* 1.68*  CALCIUM 8.0* 8.2*  MG  --  1.6*   Liver Function Tests: Recent Labs  Lab 04/17/19 2121  AST 17  ALT 12  ALKPHOS 106  BILITOT 0.5  PROT 6.7  ALBUMIN 2.7*   CBC: Recent Labs  Lab 04/17/19 2121  WBC 5.0  HGB 9.5*  HCT 30.7*  MCV 86.7  PLT 185   BNP (last 3 results) Recent Labs    04/17/19 2121  BNP 1,618.0*    CBG: Recent Labs  Lab 04/18/19 0724 04/18/19 1115  GLUCAP 147* 195*    Recent Results (from the past 240 hour(s))  Respiratory Panel by RT PCR (Flu A&B, Covid) - Nasopharyngeal Swab     Status: None   Collection Time: 04/18/19 12:49 AM   Specimen: Nasopharyngeal Swab  Result Value Ref Range Status   SARS Coronavirus 2 by RT PCR NEGATIVE NEGATIVE Final    Comment: (NOTE) SARS-CoV-2 target nucleic acids are NOT DETECTED. The SARS-CoV-2 RNA is generally detectable in upper respiratoy specimens  during the acute phase of infection. The lowest concentration of SARS-CoV-2 viral copies this assay can detect is 131 copies/mL. A negative result does not preclude SARS-Cov-2 infection and should not be used as the sole basis for treatment or other patient management decisions. A negative result may occur with  improper specimen collection/handling, submission of specimen other than nasopharyngeal swab, presence of viral mutation(s) within the areas targeted by this assay, and inadequate number of viral copies (<131 copies/mL). A negative result must be combined with clinical observations, patient history, and epidemiological  information. The expected result is Negative. Fact Sheet for Patients:  PinkCheek.be Fact Sheet for Healthcare Providers:  GravelBags.it This test is not yet ap proved or cleared by the [place] FDA and  has been authorized for detection and/or diagnosis of SARS-CoV-2 by FDA under an Emergency Use Authorization (EUA). This EUA will remain  in effect (meaning this test can be used) for the duration of the COVID-19 declaration under Section 564(b)(1) of the Act, 21 U.S.C. section 360bbb-3(b)(1), unless the authorization is terminated or revoked sooner.    Influenza A by PCR NEGATIVE NEGATIVE Final   Influenza B by PCR NEGATIVE NEGATIVE Final    Comment: (NOTE) The Xpert Xpress SARS-CoV-2/FLU/RSV assay is intended as an aid in  the diagnosis of influenza from Nasopharyngeal swab specimens and  should not be used as a sole basis for treatment. Nasal washings and  aspirates are unacceptable for Xpert Xpress SARS-CoV-2/FLU/RSV  testing. Fact Sheet for Patients: PinkCheek.be Fact Sheet for Healthcare Providers: GravelBags.it This test is not yet approved or cleared by the [place] FDA and  has been authorized for detection and/or diagnosis of SARS-CoV-2 by  FDA under an Emergency Use Authorization (EUA). This EUA will remain  in effect (meaning this test can be used) for the duration of the  Covid-19 declaration under Section 564(b)(1) of the Act, 21  U.S.C. section 360bbb-3(b)(1), unless the authorization is  terminated or revoked. Performed at [facility], [address] [place], [place] [address]      Studies: DG Chest 2 View  Result Date: 04/17/2019 CLINICAL DATA:  64 year old current smoker with an approximate 1 month history of BILATERAL lower extremity edema and a 2 day history of scrotal edema and shortness of breath. EXAM: CHEST - 2 VIEW  COMPARISON:  None. FINDINGS: Cardiac silhouette markedly enlarged. Thoracic aorta mildly tortuous. Hilar and mediastinal contours otherwise unremarkable. Pulmonary venous hypertension and mild diffuse interstitial pulmonary edema. No confluent airspace consolidation. Small pleural effusions visible on the lateral image posteriorly. Visualized bony thorax intact. IMPRESSION: Mild CHF, with marked cardiomegaly and mild diffuse interstitial pulmonary edema. Small bilateral pleural effusions. Electronically Signed   By: Evangeline Dakin M.D.   On: 04/17/2019 21:42    Scheduled Meds: . carvedilol  6.25 mg Oral BID WC  . enoxaparin (LOVENOX) injection  40 mg Subcutaneous Q24H  . furosemide  40 mg Intravenous Q12H  . insulin aspart  0-15 Units Subcutaneous TID WC  . lisinopril  5 mg Oral Daily  . potassium chloride  10 mEq Oral Daily  . sodium chloride flush  3 mL Intravenous Q12H  . spironolactone  25 mg Oral Daily   Continuous Infusions: . sodium chloride      Assessment/Plan:  1. Acute on chronic systolic congestive heart failure.  Current echocardiogram pending.  Patient on Lasix 40 mg IV twice daily, Coreg, lisinopril and spironolactone.  Continue to monitor daily weights. 2. Chronic kidney disease stage IIIb.  Monitor with diuresis. 3. Type 2 diabetes mellitus.  Hemoglobin A1c elevated at 8.0 on sliding scale insulin holding glipizide Metformin.  Trial of Tradjenta 4. Generalized weakness physical therapy evaluation 5. Restless leg syndrome.  Trial of Requip at night  Code Status:     Code Status Orders  (From admission, onward)         Start     Ordered   04/18/19 0059  Full code  Continuous     04/18/19 0102        Code Status History    This patient has a current code status but no historical code status.   Advance Care Planning Activity     Family Communication: Patient deferred me calling family today Disposition Plan: Patient still needs a couple days of  diuresis  Time spent: 35 minutes  [place]

## 2019-04-18 NOTE — Evaluation (Signed)
Physical Therapy Evaluation Patient Details Name: Shawn Hayes MRN: [id number] DOB: 05-10-1955 Today's Date: 04/18/2019   History of Present Illness  Pt is 64 y/o M with PMH idiopathic CMY (EF 25% on Echo 11/18/2018), moderate mitral valve regurgitation, T2DM, CKD3, and anemia. Pt presented to ED d/t SOB and LE swelling. Pt with acute on chronic HFrEF exacerbation.  Clinical Impression  Upon evaluation, patient alert and oriented; follows commands and demonstrates good effort with all mobility tasks.  Eager for [place] as able.  Bilat UE/LE strength and ROM grossly symmetrical and WFL; significant pitting edema knees distally.  Able to complete bed mobility with mod indep; sit/stand, basic transfers and gait (150') with RW, cga/close sup.  Demonstrates forward flexed posture with mod WBing bilat UEs; decreased step height/length bilat, R > L with intermittent R knee buckling.  Mod SOB with exertion, requiring single standing rest period  for breath support/recovery.  Sats >92% on RA throughout. Would benefit from skilled PT to address above deficits and promote optimal return to PLOF.; Recommend transition to HHPT upon discharge from acute hospitalization.      Follow Up Recommendations Home health PT    Equipment Recommendations  Rolling walker with 5" wheels    Recommendations for Other Services       Precautions / Restrictions Precautions Precautions: Fall Restrictions Weight Bearing Restrictions: No      Mobility  Bed Mobility Overal bed mobility: Modified Independent                Transfers Overall transfer level: Needs assistance Equipment used: Rolling walker (2 wheeled) Transfers: Sit to/from Stand Sit to Stand: Min guard         General transfer comment: cuing for hand placement; does require UE support/assist to complete movement transition  Ambulation/Gait Ambulation/Gait assistance: Min guard Gait Distance (Feet): 150 Feet Assistive device: Rolling  walker (2 wheeled)       General Gait Details: forward flexed posture with mod WBing bilat UEs; decreased step height/length bilat, R > L with intermittent R knee buckling.  Mod SOB with exertion, requiring single standing rest period  for breath support/recovery.  Sats >92% on RA throughout.  Stairs            Wheelchair Mobility    Modified Rankin (Stroke Patients Only)       Balance Overall balance assessment: Needs assistance Sitting-balance support: No upper extremity supported;Feet supported Sitting balance-Leahy Scale: Good     Standing balance support: Bilateral upper extremity supported Standing balance-Leahy Scale: Fair                               Pertinent Vitals/Pain Pain Assessment: No/denies pain    Home Living Family/patient expects to be discharged to:: Private residence Living Arrangements: Alone Available Help at Discharge: Family;Available PRN/intermittently   Home Access: Level entry     Home Layout: One level Home Equipment: None      Prior Function           Comments: Indep with ADLs, household and community mobilization; working 3-4 days/week at [employer] facility. No home O2.  Does endorse at least 2-3 falls in previous six months (most recent day prior to admission), and voices progressive decline in recent weeks due to fluid accumulation.     Hand Dominance        Extremity/Trunk Assessment   Upper Extremity Assessment Upper Extremity Assessment: Overall WFL for tasks  assessed    Lower Extremity Assessment Lower Extremity Assessment: Overall WFL for tasks assessed(grossly 4-/5 throughout; significant generalized edema knees distally)       Communication   Communication: No difficulties  Cognition Arousal/Alertness: Awake/alert Behavior During Therapy: WFL for tasks assessed/performed Overall Cognitive Status: Within Functional Limits for tasks assessed                                         General Comments      Exercises Other Exercises Other Exercises: Introduced education for activity pacing and energy conservation; patient voices understanding, but will benefit from reinforcement in subsequent sessions   Assessment/Plan    PT Assessment Patient needs continued PT services  PT Problem List Decreased strength;Decreased range of motion;Decreased activity tolerance;Decreased balance;Decreased mobility;Decreased coordination;Decreased knowledge of use of DME;Decreased safety awareness;Decreased knowledge of precautions       PT Treatment Interventions DME instruction;Gait training;Functional mobility training;Therapeutic activities;Therapeutic exercise;Balance training;Cognitive remediation;Patient/family education    PT Goals (Current goals can be found in the Care Plan section)  Acute Rehab PT Goals Patient Stated Goal: to feel more confident with mobility and feel better overall PT Goal Formulation: With patient Time For Goal Achievement: 05/02/19 Potential to Achieve Goals: Good    Frequency Min 2X/week   Barriers to discharge        Co-evaluation               AM-PAC PT "6 Clicks" Mobility  Outcome Measure Help needed turning from your back to your side while in a flat bed without using bedrails?: None Help needed moving from lying on your back to sitting on the side of a flat bed without using bedrails?: None Help needed moving to and from a bed to a chair (including a wheelchair)?: A Little Help needed standing up from a chair using your arms (e.g., wheelchair or bedside chair)?: A Little Help needed to walk in hospital room?: A Little Help needed climbing 3-5 steps with a railing? : A Little 6 Click Score: 20    End of Session Equipment Utilized During Treatment: Gait belt Activity Tolerance: Patient tolerated treatment well Patient left: in bed;with call bell/phone within reach Nurse Communication: Mobility status PT  Visit Diagnosis: Muscle weakness (generalized) (M62.81);Difficulty in walking, not elsewhere classified (R26.2)    Time: [id number] PT Time Calculation (min) (ACUTE ONLY): 18 min   Charges:   PT Evaluation $PT Eval Moderate Complexity: 1 Mod PT Treatments $Gait Training: 8-22 mins       Harl Wiechmann H. Owens Shark, PT, DPT, NCS 04/18/19, 5:08 PM [phone]

## 2019-04-18 NOTE — Plan of Care (Signed)
  Problem: Clinical Measurements: Goal: Respiratory complications will improve Outcome: Not Progressing   Problem: Activity: Goal: Risk for activity intolerance will decrease Outcome: Not Progressing   

## 2019-04-19 LAB — BASIC METABOLIC PANEL
Anion gap: 8 (ref 5–15)
BUN: 29 mg/dL — ABNORMAL HIGH (ref 8–23)
CO2: 22 mmol/L (ref 22–32)
Calcium: 8.2 mg/dL — ABNORMAL LOW (ref 8.9–10.3)
Chloride: 108 mmol/L (ref 98–111)
Creatinine, Ser: 1.78 mg/dL — ABNORMAL HIGH (ref 0.61–1.24)
GFR calc Af Amer: 46 mL/min — ABNORMAL LOW (ref >60)
GFR calc non Af Amer: 39 mL/min — ABNORMAL LOW (ref >60)
Glucose, Bld: 120 mg/dL — ABNORMAL HIGH (ref 70–99)
Potassium: 4.3 mmol/L (ref 3.5–5.1)
Sodium: 138 mmol/L (ref 135–145)

## 2019-04-19 LAB — GLUCOSE, CAPILLARY
Glucose-Capillary: 108 mg/dL — ABNORMAL HIGH (ref 70–99)
Glucose-Capillary: 143 mg/dL — ABNORMAL HIGH (ref 70–99)
Glucose-Capillary: 128 mg/dL — ABNORMAL HIGH (ref 70–99)
Glucose-Capillary: 190 mg/dL — ABNORMAL HIGH (ref 70–99)

## 2019-04-19 LAB — HIV ANTIBODY (ROUTINE TESTING W REFLEX): HIV Screen 4th Generation wRfx: NONREACTIVE

## 2019-04-19 LAB — ECHOCARDIOGRAM COMPLETE
Height: 73 in
Weight: 3536 oz

## 2019-04-19 MED ORDER — IPRATROPIUM-ALBUTEROL 0.5-2.5 (3) MG/3ML IN SOLN
3.0000 mL | Freq: Four times a day (QID) | RESPIRATORY_TRACT | Status: DC
  Administered 2019-04-19 – 2019-04-23 (×14): 3 mL via RESPIRATORY_TRACT
  Filled 2019-04-19 (×13): qty 3

## 2019-04-19 MED ORDER — GLIPIZIDE 5 MG PO TABS: 5.0000 mg | ORAL_TABLET | Freq: Two times a day (BID) | ORAL | 11 refills | Status: DC

## 2019-04-19 MED ORDER — SPIRONOLACTONE 25 MG PO TABS: 25.0000 mg | ORAL_TABLET | Freq: Every day | ORAL | 0 refills | Status: DC

## 2019-04-19 MED ORDER — POTASSIUM CHLORIDE ER 10 MEQ PO TBCR: 10.0000 meq | EXTENDED_RELEASE_TABLET | Freq: Every day | ORAL | 0 refills | Status: DC

## 2019-04-19 MED ORDER — CARVEDILOL 6.25 MG PO TABS: 6.2500 mg | ORAL_TABLET | Freq: Two times a day (BID) | ORAL | 0 refills | Status: DC

## 2019-04-19 MED ORDER — ROPINIROLE HCL 0.5 MG PO TABS: 0.5000 mg | ORAL_TABLET | Freq: Every day | ORAL | 0 refills | Status: DC

## 2019-04-19 MED ORDER — FUROSEMIDE 40 MG PO TABS: 40.0000 mg | ORAL_TABLET | Freq: Two times a day (BID) | ORAL | 0 refills | Status: DC

## 2019-04-19 MED ORDER — LISINOPRIL 5 MG PO TABS: 5.0000 mg | ORAL_TABLET | Freq: Every day | ORAL | 0 refills | Status: DC

## 2019-04-19 MED ORDER — FUROSEMIDE 10 MG/ML IJ SOLN
60.0000 mg | Freq: Two times a day (BID) | INTRAMUSCULAR | Status: DC
  Administered 2019-04-19 – 2019-04-20 (×3): 60 mg via INTRAVENOUS
  Filled 2019-04-19 (×3): qty 6

## 2019-04-19 NOTE — Plan of Care (Signed)
Nutrition Education Note  RD consulted for nutrition education regarding CHF.  Met with pt in room today. Pt reports good appetite and oral intake pta. Pt reports eating out at a lot of restaurants and fast food places. Pt reports that he lives alone and does not cook. RD provided patient with education regarding how to make healthier choices when eating out at restaurants.   RD provided "Low Sodium Nutrition Therapy" handout from the Academy of Nutrition and Dietetics. Reviewed patient's dietary recall. Provided examples on ways to decrease sodium intake in diet. Discouraged intake of processed foods and use of salt shaker. Encouraged fresh fruits and vegetables as well as whole grain sources of carbohydrates to maximize fiber intake.   RD discussed why it is important for patient to adhere to diet recommendations, and emphasized the role of fluids, foods to avoid, and importance of weighing self daily. Teach back method used.  Expect poor compliance.  Body mass index is 31.19 kg/m. Pt meets criteria for obesity based on current BMI.  Current diet order is HH/CHO modified, patient is consuming approximately 100% of meals at this time. Labs and medications reviewed. No further nutrition interventions warranted at this time. RD contact information provided. If additional nutrition issues arise, please re-consult RD.   Koleen Distance MS, RD, LDN Contact information available in Amion

## 2019-04-19 NOTE — TOC Progression Note (Addendum)
Transition of Care [facility]) - Progression Note    Patient Details  Name: Shawn Hayes MRN: [id number] Date of Birth: November 02, 1955  Transition of Care [facility]) CM/SW [place], RN Phone Number: 04/19/2019, 9:42 AM  Clinical Narrative:      Met with patient, discussed HH PT and OT, have a call into [facility] and still uncertain if they will accept patient.  Patient will be going to his brother's home at discharge. He reports his brother will be able to help care for him until he gets better.    Spoke with Percell Miller, brother, he confirmed patient will be coming home with him at discharge.  Address:  [address], [place], [place]  [address].    TOC to provide donated rolling walker to patient. Will continue to follow for needs. Medications to be sent to Medication Management at discharge.    Expected Discharge Plan: [place]    Expected Discharge Plan and Services Expected Discharge Plan: [place] Choice: [place] arrangements for the past 2 months: Apartment(Patient's apartment is small.  Patient's brother stated the apartment is "full of stuff and not very clean.")                                       Social Determinants of Health (SDOH) Interventions    Readmission Risk Interventions No flowsheet data found.

## 2019-04-19 NOTE — Progress Notes (Signed)
Occupational Therapy Treatment Patient Details Name: Shawn Hayes MRN: [id number] DOB: 1955/07/29 Today's Date: 04/19/2019    History of present illness Pt is 64 y/o M with PMH idiopathic CMY (EF 25% on Echo 11/18/2018), moderate mitral valve regurgitation, T2DM, CKD3, and anemia. Pt presented to ED d/t SOB and LE swelling. Pt with acute on chronic HFrEF exacerbation.   OT comments  Shawn Hayes initially received sitting EOB, resting his head and RUE on elevated HOB and guard rail secondary to nausea.  Pt complained of nausea and frequent trips to bathroom this morning.  Nausea subsided during session.  Pt completed sit to stand transfers with CGA and no AD.  Pt did require verbal cue for safety and pacing when sitting down.  OTR educated pt on UE medium resistance theraband exercises for increased UE strength and endurance.  Pt completed x10 reps of each exercise (see exercise section for details) and demonstrated understanding.  Pt left with handout of exercises to complete at home.   Home health continues to be most appropriate discharge recommendation.    Follow Up Recommendations  Home health OT;Supervision - Intermittent    Equipment Recommendations  3 in 1 bedside commode;Tub/shower seat    Recommendations for Other Services      Precautions / Restrictions Precautions Precautions: Fall Restrictions Weight Bearing Restrictions: No       Mobility Bed Mobility                  Transfers Overall transfer level: Needs assistance  Transfers: Sit to/from Stand Sit to Stand: Min guard         General transfer comment: verbal cues needed for safety when sitting    Balance Overall balance assessment: Needs assistance Sitting-balance support: No upper extremity supported Sitting balance-Leahy Scale: Good Sitting balance - Comments: pt received sitting EOB, resting head and RUE on HOB and guardrail.  Pt complaining of nausea. Sitting balance EOB is good when  prompted to balance without UE support   Standing balance support: Bilateral upper extremity supported Standing balance-Leahy Scale: Good Standing balance comment: requires CGA and at least support of one UE for static standing balance. No gross LOB detected                           ADL either performed or assessed with clinical judgement   ADL                                         General ADL Comments: Pt required CGA for sit to stand transfers and functional mobility with RW, required verbal cue for safety/sequencing in transfer     Vision Patient Visual Report: No change from baseline     Perception     Praxis      Cognition Arousal/Alertness: Awake/alert Behavior During Therapy: WFL for tasks assessed/performed Overall Cognitive Status: Within Functional Limits for tasks assessed                                          Exercises General Exercises - Upper Extremity Shoulder Flexion: Both;10 reps Shoulder Horizontal ABduction: Both;10 reps Elbow Flexion: Both;10 reps Elbow Extension: Both;10 reps Other Exercises Other Exercises: educated pt on UE theraband exercises while seated for increased BUE  strength and endurance: pt completed x10 reps bilaterally of shoulder flexion, elbow flexion/extension, horizontal abduction/band pullouts, and "diagonal pullouts"   Shoulder Instructions       General Comments      Pertinent Vitals/ Pain       Pain Location: BLE swollen, complained of nausea at beginning of session  [place] expects to be discharged to:: Private residence Living Arrangements: Alone Available Help at Discharge: Family;Available PRN/intermittently                                    Prior Functioning/Environment              Frequency  Min 2X/week        Progress Toward Goals  OT Goals(current goals can now be found in the care plan section)  Progress towards  OT goals: Progressing toward goals  Acute Rehab OT Goals Patient Stated Goal: to feel more confident with mobility and feel better overall OT Goal Formulation: With patient Time For Goal Achievement: 05/02/19 Potential to Achieve Goals: Good  Plan Discharge plan remains appropriate    Co-evaluation                 AM-PAC OT "6 Clicks" Daily Activity     Outcome Measure   Help from another person eating meals?: None Help from another person taking care of personal grooming?: A Little Help from another person toileting, which includes using toliet, bedpan, or urinal?: A Little Help from another person bathing (including washing, rinsing, drying)?: A Little Help from another person to put on and taking off regular upper body clothing?: None Help from another person to put on and taking off regular lower body clothing?: A Little 6 Click Score: 20    End of Session Equipment Utilized During Treatment: Gait belt;Rolling walker  OT Visit Diagnosis: Unsteadiness on feet (R26.81);Muscle weakness (generalized) (M62.81)   Activity Tolerance Patient tolerated treatment well   Patient Left with call bell/phone within reach;in chair   Nurse Communication          Time: [id number] OT Time Calculation (min): 33 min  Charges: OT General Charges $OT Visit: 1 Visit OT Treatments $Therapeutic Activity: 8-22 mins $Therapeutic Exercise: 8-22 mins  Myrtie Hawk, OTR/L 04/19/19, 11:24 AM

## 2019-04-19 NOTE — Plan of Care (Signed)
Oxygen saturations normal on room air but dyspnea on exertion noted.  Edema to legs remain.

## 2019-04-19 NOTE — Progress Notes (Signed)
Patient ID: Shawn Hayes, male   DOB: 20-Dec-1955, 64 y.o.   MRN: [id number] [facility] PROGRESS NOTE  KELLI MOSLEY [id number] DOB: Jun 06, 1955 DOA: 04/17/2019 PCP: [name], MD  HPI/Subjective: Patient feels okay.  States he does not think he is urinating as much as he previously did.  And trouble with his breathing this morning and last night.  Patient had 3 episodes of diarrhea today.  Restless legs are better after starting Requip last night  Objective: Vitals:   04/19/19 0819 04/19/19 1146  BP: (!) 161/110 129/90  Pulse: 80 65  Resp: 16 18  Temp: 98 F (36.7 C)   SpO2: 100% 100%    Intake/Output Summary (Last 24 hours) at 04/19/2019 1443 Last data filed at 04/19/2019 1420 Gross per 24 hour  Intake 600 ml  Output 2175 ml  Net -1575 ml   Filed Weights   04/17/19 2114 04/19/19 0533  Weight: 100.2 kg 107.2 kg    ROS: Review of Systems  Constitutional: Negative for chills and fever.  Eyes: Negative for blurred vision.  Respiratory: Positive for shortness of breath. Negative for cough.   Cardiovascular: Negative for chest pain.  Gastrointestinal: Positive for diarrhea. Negative for abdominal pain, constipation, nausea and vomiting.  Genitourinary: Negative for dysuria.  Musculoskeletal: Positive for joint pain.  Neurological: Negative for dizziness and headaches.   Exam: Physical Exam  Constitutional: He is oriented to person, place, and time.  HENT:  Nose: No mucosal edema.  Mouth/Throat: No oropharyngeal exudate or posterior oropharyngeal edema.  Eyes: Conjunctivae and lids are normal.  Neck: Carotid bruit is not present.  Cardiovascular: S1 normal and S2 normal. Exam reveals no gallop.  No murmur heard. Respiratory: No respiratory distress. He has decreased breath sounds in the right lower field and the left lower field. He has no wheezes. He has no rhonchi. He has no rales.  GI: Soft. Bowel sounds are normal. There is no abdominal  tenderness.  Musculoskeletal:     Right ankle: Swelling present.     Left ankle: Swelling present.  Lymphadenopathy:    He has no cervical adenopathy.  Neurological: He is alert and oriented to person, place, and time. No cranial nerve deficit.  Skin: Skin is warm. Nails show no clubbing.  Chronic lower extremity skin discoloration  Psychiatric: He has a normal mood and affect.      Data Reviewed: Basic Metabolic Panel: Recent Labs  Lab 04/17/19 2121 04/18/19 0441 04/19/19 0516  NA 135 138 138  K 4.1 3.8 4.3  CL 106 109 108  CO2 19* 23 22  GLUCOSE 311* 154* 120*  BUN 26* 26* 29*  CREATININE 1.81* 1.68* 1.78*  CALCIUM 8.0* 8.2* 8.2*  MG  --  1.6*  --    Liver Function Tests: Recent Labs  Lab 04/17/19 2121  AST 17  ALT 12  ALKPHOS 106  BILITOT 0.5  PROT 6.7  ALBUMIN 2.7*   CBC: Recent Labs  Lab 04/17/19 2121  WBC 5.0  HGB 9.5*  HCT 30.7*  MCV 86.7  PLT 185   BNP (last 3 results) Recent Labs    04/17/19 2121  BNP 1,618.0*    CBG: Recent Labs  Lab 04/18/19 1115 04/18/19 1643 04/18/19 2152 04/19/19 0816 04/19/19 1150  GLUCAP 195* 149* 134* 108* 143*    Recent Results (from the past 240 hour(s))  Respiratory Panel by RT PCR (Flu A&B, Covid) - Nasopharyngeal Swab     Status: None   Collection Time:  04/18/19 12:49 AM   Specimen: Nasopharyngeal Swab  Result Value Ref Range Status   SARS Coronavirus 2 by RT PCR NEGATIVE NEGATIVE Final    Comment: (NOTE) SARS-CoV-2 target nucleic acids are NOT DETECTED. The SARS-CoV-2 RNA is generally detectable in upper respiratoy specimens during the acute phase of infection. The lowest concentration of SARS-CoV-2 viral copies this assay can detect is 131 copies/mL. A negative result does not preclude SARS-Cov-2 infection and should not be used as the sole basis for treatment or other patient management decisions. A negative result may occur with  improper specimen collection/handling, submission of specimen  other than nasopharyngeal swab, presence of viral mutation(s) within the areas targeted by this assay, and inadequate number of viral copies (<131 copies/mL). A negative result must be combined with clinical observations, patient history, and epidemiological information. The expected result is Negative. Fact Sheet for Patients:  PinkCheek.be Fact Sheet for Healthcare Providers:  GravelBags.it This test is not yet ap proved or cleared by the [place] FDA and  has been authorized for detection and/or diagnosis of SARS-CoV-2 by FDA under an Emergency Use Authorization (EUA). This EUA will remain  in effect (meaning this test can be used) for the duration of the COVID-19 declaration under Section 564(b)(1) of the Act, 21 U.S.C. section 360bbb-3(b)(1), unless the authorization is terminated or revoked sooner.    Influenza A by PCR NEGATIVE NEGATIVE Final   Influenza B by PCR NEGATIVE NEGATIVE Final    Comment: (NOTE) The Xpert Xpress SARS-CoV-2/FLU/RSV assay is intended as an aid in  the diagnosis of influenza from Nasopharyngeal swab specimens and  should not be used as a sole basis for treatment. Nasal washings and  aspirates are unacceptable for Xpert Xpress SARS-CoV-2/FLU/RSV  testing. Fact Sheet for Patients: PinkCheek.be Fact Sheet for Healthcare Providers: GravelBags.it This test is not yet approved or cleared by the [place] FDA and  has been authorized for detection and/or diagnosis of SARS-CoV-2 by  FDA under an Emergency Use Authorization (EUA). This EUA will remain  in effect (meaning this test can be used) for the duration of the  Covid-19 declaration under Section 564(b)(1) of the Act, 21  U.S.C. section 360bbb-3(b)(1), unless the authorization is  terminated or revoked. Performed at [facility], [address] [place],   [id number]      Studies: DG Chest 2 View  Result Date: 04/17/2019 CLINICAL DATA:  64 year old current smoker with an approximate 1 month history of BILATERAL lower extremity edema and a 2 day history of scrotal edema and shortness of breath. EXAM: CHEST - 2 VIEW COMPARISON:  None. FINDINGS: Cardiac silhouette markedly enlarged. Thoracic aorta mildly tortuous. Hilar and mediastinal contours otherwise unremarkable. Pulmonary venous hypertension and mild diffuse interstitial pulmonary edema. No confluent airspace consolidation. Small pleural effusions visible on the lateral image posteriorly. Visualized bony thorax intact. IMPRESSION: Mild CHF, with marked cardiomegaly and mild diffuse interstitial pulmonary edema. Small bilateral pleural effusions. Electronically Signed   By: Evangeline Dakin M.D.   On: 04/17/2019 21:42    Scheduled Meds: . carvedilol  6.25 mg Oral BID WC  . enoxaparin (LOVENOX) injection  40 mg Subcutaneous Q24H  . furosemide  40 mg Intravenous Q12H  . influenza vac split quadrivalent PF  0.5 mL Intramuscular Tomorrow-1000  . insulin aspart  0-15 Units Subcutaneous TID WC  . linagliptin  5 mg Oral Daily  . lisinopril  5 mg Oral Daily  . potassium chloride  10 mEq Oral Daily  . rOPINIRole  0.5 mg Oral QHS  . sodium chloride flush  3 mL Intravenous Q12H  . spironolactone  25 mg Oral Daily   Continuous Infusions: . sodium chloride      Assessment/Plan:  1. Acute on chronic systolic congestive heart failure.  Current echocardiogram pending.  Increase Lasix 60 mg IV twice daily.  Continue Coreg, lisinopril and spironolactone.  Continue to monitor daily weights.  I do not think the patient is 7 kg heavier today than yesterday.  I believe the first weight is incorrect from the emergency room. 2. Chronic kidney disease stage IIIb with type 2 diabetes mellitus.  Monitor with diuresis. 3. Type 2 diabetes mellitus.  Hemoglobin A1c elevated at 8.0 on sliding scale insulin holding  glipizide Metformin.  Trial of Tradjenta.  Unfortunately medication management does not have Tradjenta and will likely have to go back on glipizide.  Patient has diarrhea with Metformin. 4. Generalized weakness physical therapy evaluation 5. Restless leg syndrome.  Trial of Requip at night.  The symptoms are improved today.  Code Status:     Code Status Orders  (From admission, onward)         Start     Ordered   04/18/19 0059  Full code  Continuous     04/18/19 0102        Code Status History    This patient has a current code status but no historical code status.   Advance Care Planning Activity     Disposition Plan: Patient still needs a few days of diuresis.  Would like to see the weights trend down prior to disposition.  Time spent: 28 minutes  [place]

## 2019-04-20 ENCOUNTER — Inpatient Hospital Stay: Payer: Self-pay

## 2019-04-20 DIAGNOSIS — I10 Essential (primary) hypertension: Secondary | ICD-10-CM

## 2019-04-20 LAB — BASIC METABOLIC PANEL
Anion gap: 15 (ref 5–15)
BUN: 30 mg/dL — ABNORMAL HIGH (ref 8–23)
CO2: 22 mmol/L (ref 22–32)
Calcium: 8.4 mg/dL — ABNORMAL LOW (ref 8.9–10.3)
Chloride: 100 mmol/L (ref 98–111)
Creatinine, Ser: 1.99 mg/dL — ABNORMAL HIGH (ref 0.61–1.24)
GFR calc Af Amer: 40 mL/min — ABNORMAL LOW (ref >60)
GFR calc non Af Amer: 34 mL/min — ABNORMAL LOW (ref >60)
Glucose, Bld: 156 mg/dL — ABNORMAL HIGH (ref 70–99)
Potassium: 4.5 mmol/L (ref 3.5–5.1)
Sodium: 137 mmol/L (ref 135–145)

## 2019-04-20 LAB — GLUCOSE, CAPILLARY
Glucose-Capillary: 136 mg/dL — ABNORMAL HIGH (ref 70–99)
Glucose-Capillary: 193 mg/dL — ABNORMAL HIGH (ref 70–99)
Glucose-Capillary: 272 mg/dL — ABNORMAL HIGH (ref 70–99)
Glucose-Capillary: 224 mg/dL — ABNORMAL HIGH (ref 70–99)

## 2019-04-20 MED ORDER — METHYLPREDNISOLONE SODIUM SUCC 40 MG IJ SOLR
40.0000 mg | Freq: Every day | INTRAMUSCULAR | Status: DC
  Administered 2019-04-20 – 2019-04-21 (×2): 40 mg via INTRAVENOUS
  Filled 2019-04-20 (×2): qty 1

## 2019-04-20 MED ORDER — CARVEDILOL 12.5 MG PO TABS
12.5000 mg | ORAL_TABLET | Freq: Two times a day (BID) | ORAL | Status: DC
  Administered 2019-04-20 – 2019-04-23 (×6): 12.5 mg via ORAL
  Filled 2019-04-20 (×6): qty 1

## 2019-04-20 MED ORDER — FAMOTIDINE 20 MG PO TABS
10.0000 mg | ORAL_TABLET | Freq: Every day | ORAL | Status: DC
  Administered 2019-04-20 – 2019-04-23 (×4): 10 mg via ORAL
  Filled 2019-04-20 (×4): qty 1

## 2019-04-20 NOTE — Progress Notes (Signed)
Physical Therapy Treatment Patient Details Name: Shawn Hayes MRN: [id number] DOB: September 05, 1955 Today's Date: 04/20/2019    History of Present Illness Pt is 64 y/o M with PMH idiopathic CMY (EF 25% on Echo 11/18/2018), moderate mitral valve regurgitation, T2DM, CKD3, and anemia. Pt presented to ED d/t SOB and LE swelling. Pt with acute on chronic HFrEF exacerbation.    PT Comments    Pt was in semi fowlers position in bed upon arriving. He is A and O x 4 and cooperative and pleasant. Pt however is slightly impulsive and required vcs to slow down. He requested to go to [facility] and stands without use of RW and has one episode LOB at EOB with therapist intervention to prevent fall. Therapist discussed importance of using RW at all times until strength returns. He agrees and used RW throughout remainder of session without issues. He was able to ambulate one lap around unit (160 ft) with RW + gait belt. He tolerated well but was fatigued and required 2 standing rest breaks during. O2/HR stable throughout. Pt is progressing well with PT and will continue to be followed per POC. Pt was repositioned in bed post session with call bell in reach and bed alarm placed for safety.     Follow Up Recommendations  Home health PT     Equipment Recommendations  Other (comment)(pt has recieved personal RW in room.)    Recommendations for Other Services       Precautions / Restrictions Precautions Precautions: Fall Restrictions Weight Bearing Restrictions: No    Mobility  Bed Mobility Overal bed mobility: Modified Independent             General bed mobility comments: HOB elevated, pt exited R side of bed. no lifting assist. pt slightly impulsive and required vcs to slow down. requested to use BR once seated EOB.  Transfers Overall transfer level: Needs assistance Equipment used: Rolling walker (2 wheeled) Transfers: Sit to/from Stand Sit to Stand: Min guard         General transfer comment:  Pt slightly impulsive and required vcs to slow down. He was able to stand without lifting assistance but did required CGA for safety.  Ambulation/Gait Ambulation/Gait assistance: Min guard Gait Distance (Feet): 160 Feet Assistive device: Rolling walker (2 wheeled) Gait Pattern/deviations: Narrow base of support;Step-through pattern Gait velocity: decreased   General Gait Details: Pt had one LOB after standing from EOB headed towards BR 2/2 to impulsively trying to leave RW. Therapist adjusted height of RW to improve posture. Vcs during gait for improved BOS  and decrease cadence for safety.   Stairs             Wheelchair Mobility    Modified Rankin (Stroke Patients Only)       Balance           Standing balance support: Bilateral upper extremity supported Standing balance-Leahy Scale: Good Standing balance comment: Pt with poor balance without BUE support 2/2 to weakness                            Cognition Arousal/Alertness: Awake/alert Behavior During Therapy: WFL for tasks assessed/performed Overall Cognitive Status: Within Functional Limits for tasks assessed                                 General Comments: Pt is A and O x 4 and cooperative  throughout      Exercises      General Comments        Pertinent Vitals/Pain Pain Assessment: No/denies pain Pain Score: 0-No pain Pain Intervention(s): Other (comment)(pt reports no pain but states" I feel weak.")    Home Living                      Prior Function            PT Goals (current goals can now be found in the care plan section) Acute Rehab PT Goals Patient Stated Goal: " I want to get better so I can go home and stay home." Progress towards PT goals: Progressing toward goals    Frequency    Min 2X/week      PT Plan Current plan remains appropriate    Co-evaluation              AM-PAC PT "6 Clicks" Mobility   Outcome Measure  Help needed  turning from your back to your side while in a flat bed without using bedrails?: None Help needed moving from lying on your back to sitting on the side of a flat bed without using bedrails?: None Help needed moving to and from a bed to a chair (including a wheelchair)?: A Little Help needed standing up from a chair using your arms (e.g., wheelchair or bedside chair)?: A Little Help needed to walk in hospital room?: A Little Help needed climbing 3-5 steps with a railing? : A Little 6 Click Score: 20    End of Session Equipment Utilized During Treatment: Gait belt Activity Tolerance: Patient tolerated treatment well Patient left: in bed;with call bell/phone within reach;with bed alarm set Nurse Communication: Mobility status PT Visit Diagnosis: Muscle weakness (generalized) (M62.81);Difficulty in walking, not elsewhere classified (R26.2)     Time: [id number] PT Time Calculation (min) (ACUTE ONLY): 24 min  Charges:  $Gait Training: 8-22 mins $Therapeutic Activity: 8-22 mins                     Julaine Fusi PTA 04/20/19, 3:20 PM

## 2019-04-20 NOTE — Progress Notes (Signed)
Patient ID: Shawn Hayes, male   DOB: 1955-12-13, 64 y.o.   MRN: [id number] [facility] PROGRESS NOTE  Shawn Hayes [id number] DOB: 1955/03/08 DOA: 04/17/2019 PCP: [name], MD  HPI/Subjective: Patient having trouble breathing at night.  States his scrotal swelling has gone down.  Did feel dizzy and some abdominal discomfort.  Diarrhea settle down.  Restless leg symptoms are better.  Objective: Vitals:   04/20/19 0747 04/20/19 0845  BP: (!) 151/97 (!) 168/98  Pulse: 70 73  Resp: 19 19  Temp: 98.1 F (36.7 C)   SpO2: 98% 99%    Intake/Output Summary (Last 24 hours) at 04/20/2019 1505 Last data filed at 04/20/2019 1232 Gross per 24 hour  Intake 240 ml  Output 2550 ml  Net -2310 ml   Filed Weights   04/17/19 2114 04/19/19 0533 04/20/19 0345  Weight: 100.2 kg 107.2 kg 105.7 kg    ROS: Review of Systems  Constitutional: Negative for chills and fever.  Eyes: Negative for blurred vision.  Respiratory: Positive for shortness of breath. Negative for cough.   Cardiovascular: Negative for chest pain.  Gastrointestinal: Positive for abdominal pain. Negative for constipation, diarrhea, nausea and vomiting.  Genitourinary: Negative for dysuria.  Musculoskeletal: Positive for joint pain.  Neurological: Positive for dizziness. Negative for headaches.   Exam: Physical Exam  Constitutional: He is oriented to person, place, and time.  HENT:  Nose: No mucosal edema.  Mouth/Throat: No oropharyngeal exudate or posterior oropharyngeal edema.  Eyes: Conjunctivae and lids are normal.  Neck: Carotid bruit is not present.  Cardiovascular: S1 normal and S2 normal. Exam reveals no gallop.  No murmur heard. Respiratory: No respiratory distress. He has decreased breath sounds in the right lower field and the left lower field. He has no wheezes. He has no rhonchi. He has no rales.  GI: Soft. Bowel sounds are normal. There is no abdominal tenderness.  Musculoskeletal:     Right ankle: Swelling present.     Left ankle: Swelling present.  Lymphadenopathy:    He has no cervical adenopathy.  Neurological: He is alert and oriented to person, place, and time. No cranial nerve deficit.  Skin: Skin is warm. Nails show no clubbing.  Chronic lower extremity skin discoloration  Psychiatric: He has a normal mood and affect.      Data Reviewed: Basic Metabolic Panel: Recent Labs  Lab 04/17/19 2121 04/18/19 0441 04/19/19 0516 04/20/19 0430  NA 135 138 138 137  K 4.1 3.8 4.3 4.5  CL 106 109 108 100  CO2 19* 23 22 22   GLUCOSE 311* 154* 120* 156*  BUN 26* 26* 29* 30*  CREATININE 1.81* 1.68* 1.78* 1.99*  CALCIUM 8.0* 8.2* 8.2* 8.4*  MG  --  1.6*  --   --    Liver Function Tests: Recent Labs  Lab 04/17/19 2121  AST 17  ALT 12  ALKPHOS 106  BILITOT 0.5  PROT 6.7  ALBUMIN 2.7*   CBC: Recent Labs  Lab 04/17/19 2121  WBC 5.0  HGB 9.5*  HCT 30.7*  MCV 86.7  PLT 185   BNP (last 3 results) Recent Labs    04/17/19 2121  BNP 1,618.0*    CBG: Recent Labs  Lab 04/19/19 1150 04/19/19 1629 04/19/19 2112 04/20/19 0748 04/20/19 1223  GLUCAP 143* 128* 190* 136* 193*    Recent Results (from the past 240 hour(s))  Respiratory Panel by RT PCR (Flu A&B, Covid) - Nasopharyngeal Swab     Status: None  Collection Time: 04/18/19 12:49 AM   Specimen: Nasopharyngeal Swab  Result Value Ref Range Status   SARS Coronavirus 2 by RT PCR NEGATIVE NEGATIVE Final    Comment: (NOTE) SARS-CoV-2 target nucleic acids are NOT DETECTED. The SARS-CoV-2 RNA is generally detectable in upper respiratoy specimens during the acute phase of infection. The lowest concentration of SARS-CoV-2 viral copies this assay can detect is 131 copies/mL. A negative result does not preclude SARS-Cov-2 infection and should not be used as the sole basis for treatment or other patient management decisions. A negative result may occur with  improper specimen collection/handling,  submission of specimen other than nasopharyngeal swab, presence of viral mutation(s) within the areas targeted by this assay, and inadequate number of viral copies (<131 copies/mL). A negative result must be combined with clinical observations, patient history, and epidemiological information. The expected result is Negative. Fact Sheet for Patients:  PinkCheek.be Fact Sheet for Healthcare Providers:  GravelBags.it This test is not yet ap proved or cleared by the [place] FDA and  has been authorized for detection and/or diagnosis of SARS-CoV-2 by FDA under an Emergency Use Authorization (EUA). This EUA will remain  in effect (meaning this test can be used) for the duration of the COVID-19 declaration under Section 564(b)(1) of the Act, 21 U.S.C. section 360bbb-3(b)(1), unless the authorization is terminated or revoked sooner.    Influenza A by PCR NEGATIVE NEGATIVE Final   Influenza B by PCR NEGATIVE NEGATIVE Final    Comment: (NOTE) The Xpert Xpress SARS-CoV-2/FLU/RSV assay is intended as an aid in  the diagnosis of influenza from Nasopharyngeal swab specimens and  should not be used as a sole basis for treatment. Nasal washings and  aspirates are unacceptable for Xpert Xpress SARS-CoV-2/FLU/RSV  testing. Fact Sheet for Patients: PinkCheek.be Fact Sheet for Healthcare Providers: GravelBags.it This test is not yet approved or cleared by the [place] FDA and  has been authorized for detection and/or diagnosis of SARS-CoV-2 by  FDA under an Emergency Use Authorization (EUA). This EUA will remain  in effect (meaning this test can be used) for the duration of the  Covid-19 declaration under Section 564(b)(1) of the Act, 21  U.S.C. section 360bbb-3(b)(1), unless the authorization is  terminated or revoked. Performed at [facility], [address] [place], [place] [address]      Studies: US Venous Img Lower Bilateral (DVT)  Result Date: 04/20/2019 CLINICAL DATA:  Pain and swelling x1 year EXAM: BILATERAL LOWER EXTREMITY VENOUS DOPPLER ULTRASOUND TECHNIQUE: Gray-scale sonography with compression, as well as color and duplex ultrasound, were performed to evaluate the deep venous system(s) from the level of the common femoral vein through the popliteal and proximal calf veins. COMPARISON:  None. FINDINGS: VENOUS Normal compressibility of the common femoral, superficial femoral, and popliteal veins, as well as the visualized calf veins. Visualized portions of profunda femoral vein and great saphenous vein unremarkable. No filling defects to suggest DVT on grayscale or color Doppler imaging. Doppler waveforms show normal direction of venous flow, normal respiratory phasicity and response to augmentation. OTHER Fluid collection in the posterior right popliteal fossa 2.4 x 1.1 x 2.1 cm. Limitations: none IMPRESSION: 1. Negative for DVT. 2. Small right popliteal cyst Electronically Signed   By: Lucrezia Europe M.D.   On: 04/20/2019 11:14    Scheduled Meds: . carvedilol  6.25 mg Oral BID WC  . enoxaparin (LOVENOX) injection  40 mg Subcutaneous Q24H  . famotidine  10 mg Oral Daily  .  furosemide  60 mg Intravenous BID  . influenza vac split quadrivalent PF  0.5 mL Intramuscular Tomorrow-1000  . insulin aspart  0-15 Units Subcutaneous TID WC  . ipratropium-albuterol  3 mL Nebulization Q6H  . linagliptin  5 mg Oral Daily  . lisinopril  5 mg Oral Daily  . methylPREDNISolone (SOLU-MEDROL) injection  40 mg Intravenous Daily  . potassium chloride  10 mEq Oral Daily  . rOPINIRole  0.5 mg Oral QHS  . sodium chloride flush  3 mL Intravenous Q12H  . spironolactone  25 mg Oral Daily   Continuous Infusions: . sodium chloride      Assessment/Plan:  1. Acute on chronic systolic congestive heart failure with anasarca current echocardiogram shows an EF of 30  to 35% which is better than the previous echocardiogram..  Continue Lasix 60 mg IV twice daily.  Increased dose of Coreg to 12.5 mg twice daily.  Continue lisinopril and spironolactone.  Daily weights trending better.  Leg swelling is a little bit less we will see if we can get TED hose on him today.  Ultrasound lower extremity negative for DVT 2. Chronic kidney disease stage IIIb with type 2 diabetes mellitus.  Monitor with diuresis. 3. Type 2 diabetes mellitus.  Hemoglobin A1c elevated at 8.0 on sliding scale insulin holding glipizide Metformin.  Trial of Tradjenta.  Unfortunately medication management does not have Tradjenta and will likely have to go back on glipizide.  Patient has diarrhea with Metformin. 4. Generalized weakness physical therapy evaluation 5. Restless leg syndrome.  Continue Requip at night.  6. Essential hypertension blood pressure elevated today.  Titrate up Coreg 12.5 mg twice daily  Code Status:     Code Status Orders  (From admission, onward)         Start     Ordered   04/18/19 0059  Full code  Continuous     04/18/19 0102        Code Status History    This patient has a current code status but no historical code status.   Advance Care Planning Activity     Disposition Plan: Patient still very swollen likely needs a few more days of diuresis.  Continue to monitor kidney function closely.  Time spent: 30 minutes.  Called brother Ed on the phone.  Spoke with him at length.  He did give me the number of his wife at [phone] for phone call for tomorrow.  [place]  [facility] [employer]

## 2019-04-21 DIAGNOSIS — N179 Acute kidney failure, unspecified: Secondary | ICD-10-CM

## 2019-04-21 DIAGNOSIS — J441 Chronic obstructive pulmonary disease with (acute) exacerbation: Secondary | ICD-10-CM

## 2019-04-21 DIAGNOSIS — N189 Chronic kidney disease, unspecified: Secondary | ICD-10-CM

## 2019-04-21 DIAGNOSIS — N401 Enlarged prostate with lower urinary tract symptoms: Secondary | ICD-10-CM

## 2019-04-21 DIAGNOSIS — R3911 Hesitancy of micturition: Secondary | ICD-10-CM

## 2019-04-21 LAB — BASIC METABOLIC PANEL
Anion gap: 8 (ref 5–15)
BUN: 36 mg/dL — ABNORMAL HIGH (ref 8–23)
CO2: 24 mmol/L (ref 22–32)
Calcium: 8.5 mg/dL — ABNORMAL LOW (ref 8.9–10.3)
Chloride: 100 mmol/L (ref 98–111)
Creatinine, Ser: 2.12 mg/dL — ABNORMAL HIGH (ref 0.61–1.24)
GFR calc Af Amer: 37 mL/min — ABNORMAL LOW (ref >60)
GFR calc non Af Amer: 32 mL/min — ABNORMAL LOW (ref >60)
Glucose, Bld: 240 mg/dL — ABNORMAL HIGH (ref 70–99)
Potassium: 4.8 mmol/L (ref 3.5–5.1)
Sodium: 132 mmol/L — ABNORMAL LOW (ref 135–145)

## 2019-04-21 LAB — GLUCOSE, CAPILLARY
Glucose-Capillary: 169 mg/dL — ABNORMAL HIGH (ref 70–99)
Glucose-Capillary: 188 mg/dL — ABNORMAL HIGH (ref 70–99)
Glucose-Capillary: 240 mg/dL — ABNORMAL HIGH (ref 70–99)
Glucose-Capillary: 248 mg/dL — ABNORMAL HIGH (ref 70–99)

## 2019-04-21 MED ORDER — TAMSULOSIN HCL 0.4 MG PO CAPS
0.4000 mg | ORAL_CAPSULE | Freq: Every day | ORAL | Status: DC
  Administered 2019-04-21 – 2019-04-23 (×3): 0.4 mg via ORAL
  Filled 2019-04-21 (×3): qty 1

## 2019-04-21 MED ORDER — FUROSEMIDE 10 MG/ML IJ SOLN
60.0000 mg | Freq: Every day | INTRAMUSCULAR | Status: DC
  Administered 2019-04-21: 09:00:00 60 mg via INTRAVENOUS
  Filled 2019-04-21: qty 6

## 2019-04-21 NOTE — Progress Notes (Signed)
Patient ID: Shawn Hayes, male   DOB: Sep 14, 1955, 64 y.o.   MRN: [id number] [facility] PROGRESS NOTE  Shawn Hayes [id number] DOB: June 11, 1955 DOA: 04/17/2019 PCP: [name], MD  HPI/Subjective:Patient feeling better today.  Breathing better today.  States his swelling is gone down.  Objective: Vitals:   04/21/19 0756 04/21/19 1141  BP: 125/74 140/81  Pulse: 69 64  Resp: 17 16  Temp: (!) 97.5 F (36.4 C) (!) 97.3 F (36.3 C)  SpO2: 97% 95%    Intake/Output Summary (Last 24 hours) at 04/21/2019 1209 Last data filed at 04/21/2019 1143 Gross per 24 hour  Intake 50 ml  Output 3675 ml  Net -3625 ml   Filed Weights   04/19/19 0533 04/20/19 0345 04/21/19 1043  Weight: 107.2 kg 105.7 kg 103.3 kg    ROS: Review of Systems  Constitutional: Negative for chills and fever.  Eyes: Negative for blurred vision.  Respiratory: Negative for cough and shortness of breath.   Cardiovascular: Negative for chest pain.  Gastrointestinal: Negative for abdominal pain, constipation, diarrhea, nausea and vomiting.  Genitourinary: Negative for dysuria.  Musculoskeletal: Positive for joint pain.  Neurological: Negative for dizziness and headaches.   Exam: Physical Exam  Constitutional: He is oriented to person, place, and time.  HENT:  Nose: No mucosal edema.  Mouth/Throat: No oropharyngeal exudate or posterior oropharyngeal edema.  Eyes: Conjunctivae and lids are normal.  Neck: Carotid bruit is not present.  Cardiovascular: S1 normal and S2 normal. Exam reveals no gallop.  No murmur heard. Respiratory: No respiratory distress. He has decreased breath sounds in the right lower field and the left lower field. He has no wheezes. He has no rhonchi. He has no rales.  GI: Soft. Bowel sounds are normal. There is no abdominal tenderness.  Musculoskeletal:     Right ankle: Swelling present.     Left ankle: Swelling present.  Lymphadenopathy:    He has no cervical  adenopathy.  Neurological: He is alert and oriented to person, place, and time. No cranial nerve deficit.  Skin: Skin is warm. Nails show no clubbing.  Chronic lower extremity skin discoloration  Psychiatric: He has a normal mood and affect.      Data Reviewed: Basic Metabolic Panel: Recent Labs  Lab 04/17/19 2121 04/18/19 0441 04/19/19 0516 04/20/19 0430 04/21/19 0456  NA 135 138 138 137 132*  K 4.1 3.8 4.3 4.5 4.8  CL 106 109 108 100 100  CO2 19* 23 22 22 24   GLUCOSE 311* 154* 120* 156* 240*  BUN 26* 26* 29* 30* 36*  CREATININE 1.81* 1.68* 1.78* 1.99* 2.12*  CALCIUM 8.0* 8.2* 8.2* 8.4* 8.5*  MG  --  1.6*  --   --   --    Liver Function Tests: Recent Labs  Lab 04/17/19 2121  AST 17  ALT 12  ALKPHOS 106  BILITOT 0.5  PROT 6.7  ALBUMIN 2.7*   CBC: Recent Labs  Lab 04/17/19 2121  WBC 5.0  HGB 9.5*  HCT 30.7*  MCV 86.7  PLT 185   BNP (last 3 results) Recent Labs    04/17/19 2121  BNP 1,618.0*    CBG: Recent Labs  Lab 04/20/19 1223 04/20/19 1645 04/20/19 2052 04/21/19 0757 04/21/19 1142  GLUCAP 193* 272* 224* 169* 188*    Recent Results (from the past 240 hour(s))  Respiratory Panel by RT PCR (Flu A&B, Covid) - Nasopharyngeal Swab     Status: None   Collection Time: 04/18/19 12:49  AM   Specimen: Nasopharyngeal Swab  Result Value Ref Range Status   SARS Coronavirus 2 by RT PCR NEGATIVE NEGATIVE Final    Comment: (NOTE) SARS-CoV-2 target nucleic acids are NOT DETECTED. The SARS-CoV-2 RNA is generally detectable in upper respiratoy specimens during the acute phase of infection. The lowest concentration of SARS-CoV-2 viral copies this assay can detect is 131 copies/mL. A negative result does not preclude SARS-Cov-2 infection and should not be used as the sole basis for treatment or other patient management decisions. A negative result may occur with  improper specimen collection/handling, submission of specimen other than nasopharyngeal swab,  presence of viral mutation(s) within the areas targeted by this assay, and inadequate number of viral copies (<131 copies/mL). A negative result must be combined with clinical observations, patient history, and epidemiological information. The expected result is Negative. Fact Sheet for Patients:  PinkCheek.be Fact Sheet for Healthcare Providers:  GravelBags.it This test is not yet ap proved or cleared by the [place] FDA and  has been authorized for detection and/or diagnosis of SARS-CoV-2 by FDA under an Emergency Use Authorization (EUA). This EUA will remain  in effect (meaning this test can be used) for the duration of the COVID-19 declaration under Section 564(b)(1) of the Act, 21 U.S.C. section 360bbb-3(b)(1), unless the authorization is terminated or revoked sooner.    Influenza A by PCR NEGATIVE NEGATIVE Final   Influenza B by PCR NEGATIVE NEGATIVE Final    Comment: (NOTE) The Xpert Xpress SARS-CoV-2/FLU/RSV assay is intended as an aid in  the diagnosis of influenza from Nasopharyngeal swab specimens and  should not be used as a sole basis for treatment. Nasal washings and  aspirates are unacceptable for Xpert Xpress SARS-CoV-2/FLU/RSV  testing. Fact Sheet for Patients: PinkCheek.be Fact Sheet for Healthcare Providers: GravelBags.it This test is not yet approved or cleared by the [place] FDA and  has been authorized for detection and/or diagnosis of SARS-CoV-2 by  FDA under an Emergency Use Authorization (EUA). This EUA will remain  in effect (meaning this test can be used) for the duration of the  Covid-19 declaration under Section 564(b)(1) of the Act, 21  U.S.C. section 360bbb-3(b)(1), unless the authorization is  terminated or revoked. Performed at [facility], [address] [place], [place] [address]      Studies: US Venous  Img Lower Bilateral (DVT)  Result Date: 04/20/2019 CLINICAL DATA:  Pain and swelling x1 year EXAM: BILATERAL LOWER EXTREMITY VENOUS DOPPLER ULTRASOUND TECHNIQUE: Gray-scale sonography with compression, as well as color and duplex ultrasound, were performed to evaluate the deep venous system(s) from the level of the common femoral vein through the popliteal and proximal calf veins. COMPARISON:  None. FINDINGS: VENOUS Normal compressibility of the common femoral, superficial femoral, and popliteal veins, as well as the visualized calf veins. Visualized portions of profunda femoral vein and great saphenous vein unremarkable. No filling defects to suggest DVT on grayscale or color Doppler imaging. Doppler waveforms show normal direction of venous flow, normal respiratory phasicity and response to augmentation. OTHER Fluid collection in the posterior right popliteal fossa 2.4 x 1.1 x 2.1 cm. Limitations: none IMPRESSION: 1. Negative for DVT. 2. Small right popliteal cyst Electronically Signed   By: Lucrezia Europe M.D.   On: 04/20/2019 11:14    Scheduled Meds: . carvedilol  12.5 mg Oral BID WC  . enoxaparin (LOVENOX) injection  40 mg Subcutaneous Q24H  . famotidine  10 mg Oral Daily  . furosemide  60 mg  Intravenous Daily  . influenza vac split quadrivalent PF  0.5 mL Intramuscular Tomorrow-1000  . insulin aspart  0-15 Units Subcutaneous TID WC  . ipratropium-albuterol  3 mL Nebulization Q6H  . linagliptin  5 mg Oral Daily  . lisinopril  5 mg Oral Daily  . methylPREDNISolone (SOLU-MEDROL) injection  40 mg Intravenous Daily  . potassium chloride  10 mEq Oral Daily  . rOPINIRole  0.5 mg Oral QHS  . sodium chloride flush  3 mL Intravenous Q12H  . spironolactone  25 mg Oral Daily  . tamsulosin  0.4 mg Oral Daily   Continuous Infusions: . sodium chloride      Assessment/Plan:  1. Acute on chronic systolic congestive heart failure with anasarca current echocardiogram shows an EF of 30 to 35% which is  better than the previous echocardiogram.. Cut back on Lasix to daily dosing.  Continue increased dose of Coreg to 12.5 mg twice daily.  Continue lisinopril and spironolactone.  Daily weights trending better.  Leg swelling is a little bit less, and we were able to get TED hose on his legs.Marland Kitchen  Ultrasound lower extremity negative for DVT 2. Acute kidney injury on chronic kidney disease stage IIIb with type 2 diabetes mellitus.  Cut back on diuretics today and recheck creatinine tomorrow. 3. COPD exacerbation.  Better with steroids started yesterday.  Continue nebulizers. 4. Type 2 diabetes mellitus.  Hemoglobin A1c elevated at 8.0 on sliding scale insulin holding glipizide Metformin.  Trial of Tradjenta.  Unfortunately medication management does not have Tradjenta and will likely have to go back on glipizide.  Patient has diarrhea with Metformin. 5. Generalized weakness physical therapy evaluation 6. Restless leg syndrome.  Continue Requip at night.  7. Essential hypertension.  Better today after adjustment of medications. 8. BPH.  Patient complaining of difficulty getting his urine out and dribbling at the end of his urine stream.  Start Flomax and monitor.  Code Status:     Code Status Orders  (From admission, onward)         Start     Ordered   04/18/19 0059  Full code  Continuous     04/18/19 0102        Code Status History    This patient has a current code status but no historical code status.   Advance Care Planning Activity     Disposition Plan: Likely discharge home tomorrow.  Will need meds for medication management, since I adjusted medications over the weekend.  Time spent: 27 minutes.  Spoke with Winfred Leeds at [place]  [facility]

## 2019-04-21 NOTE — Progress Notes (Signed)
1600 resumed care of patient, patient alert and oriented,  Patient has an answer for everything, patient apprehensive to Possibly receiving HH.

## 2019-04-22 DIAGNOSIS — E875 Hyperkalemia: Secondary | ICD-10-CM

## 2019-04-22 LAB — BASIC METABOLIC PANEL
Anion gap: 9 (ref 5–15)
Anion gap: 9 (ref 5–15)
BUN: 51 mg/dL — ABNORMAL HIGH (ref 8–23)
BUN: 49 mg/dL — ABNORMAL HIGH (ref 8–23)
CO2: 25 mmol/L (ref 22–32)
CO2: 23 mmol/L (ref 22–32)
Calcium: 8.7 mg/dL — ABNORMAL LOW (ref 8.9–10.3)
Calcium: 8.4 mg/dL — ABNORMAL LOW (ref 8.9–10.3)
Chloride: 100 mmol/L (ref 98–111)
Chloride: 101 mmol/L (ref 98–111)
Creatinine, Ser: 2.2 mg/dL — ABNORMAL HIGH (ref 0.61–1.24)
Creatinine, Ser: 2.03 mg/dL — ABNORMAL HIGH (ref 0.61–1.24)
GFR calc Af Amer: 35 mL/min — ABNORMAL LOW (ref >60)
GFR calc Af Amer: 39 mL/min — ABNORMAL LOW (ref >60)
GFR calc non Af Amer: 31 mL/min — ABNORMAL LOW (ref >60)
GFR calc non Af Amer: 34 mL/min — ABNORMAL LOW (ref >60)
Glucose, Bld: 255 mg/dL — ABNORMAL HIGH (ref 70–99)
Glucose, Bld: 258 mg/dL — ABNORMAL HIGH (ref 70–99)
Potassium: 5.3 mmol/L — ABNORMAL HIGH (ref 3.5–5.1)
Potassium: 5.1 mmol/L (ref 3.5–5.1)
Sodium: 134 mmol/L — ABNORMAL LOW (ref 135–145)
Sodium: 133 mmol/L — ABNORMAL LOW (ref 135–145)

## 2019-04-22 LAB — GLUCOSE, CAPILLARY
Glucose-Capillary: 188 mg/dL — ABNORMAL HIGH (ref 70–99)
Glucose-Capillary: 238 mg/dL — ABNORMAL HIGH (ref 70–99)
Glucose-Capillary: 262 mg/dL — ABNORMAL HIGH (ref 70–99)
Glucose-Capillary: 236 mg/dL — ABNORMAL HIGH (ref 70–99)

## 2019-04-22 MED ORDER — SODIUM CHLORIDE 0.9 % IV BOLUS
250.0000 mL | Freq: Once | INTRAVENOUS | Status: AC
  Administered 2019-04-22: 250 mL via INTRAVENOUS

## 2019-04-22 MED ORDER — SODIUM ZIRCONIUM CYCLOSILICATE 10 G PO PACK: 10.0000 g | PACK | Freq: Two times a day (BID) | ORAL | Status: DC

## 2019-04-22 MED ORDER — SODIUM ZIRCONIUM CYCLOSILICATE 10 G PO PACK
10.0000 g | PACK | Freq: Once | ORAL | Status: AC
  Administered 2019-04-22: 10 g via ORAL
  Filled 2019-04-22: qty 1

## 2019-04-22 MED ORDER — PREDNISONE 20 MG PO TABS
40.0000 mg | ORAL_TABLET | Freq: Every day | ORAL | Status: DC
  Administered 2019-04-22 – 2019-04-23 (×2): 40 mg via ORAL
  Filled 2019-04-22 (×2): qty 2

## 2019-04-22 MED ORDER — SODIUM ZIRCONIUM CYCLOSILICATE 10 G PO PACK
10.0000 g | PACK | Freq: Two times a day (BID) | ORAL | Status: DC
  Administered 2019-04-22: 10 g via ORAL
  Filled 2019-04-22 (×2): qty 1

## 2019-04-22 NOTE — Progress Notes (Signed)
Pt's AM potassium 5.3, up from 4.8 yesterday; notified NP Ouma and no new orders given at this time.   Earleen Reaper, RN

## 2019-04-22 NOTE — Progress Notes (Signed)
Inpatient Diabetes Program Recommendations  AACE/ADA: New Consensus Statement on Inpatient Glycemic Control (2015)  Target Ranges:  Prepandial:   less than 140 mg/dL      Peak postprandial:   less than 180 mg/dL (1-2 hours)      Critically ill patients:  140 - 180 mg/dL   Results for TRAVEON, EADY (MRN [id number]) as of 04/22/2019 12:35  Ref. Range 04/21/2019 07:57 04/21/2019 11:42 04/21/2019 16:42 04/21/2019 21:22  Glucose-Capillary Latest Ref Range: 70 - 99 mg/dL 169 (H)  3 units NOVOLOG  188 (H)  3 units NOVOLOG  240 (H)  5 units NOVOLOG  248 (H)   Results for ALIREZA, OLAIZ (MRN [id number]) as of 04/22/2019 12:35  Ref. Range 04/22/2019 08:17 04/22/2019 12:24  Glucose-Capillary Latest Ref Range: 70 - 99 mg/dL 188 (H)  3 units NOVOLOG  238 (H)  5 units NOVOLOG    Home DM Meds: Metaglip 5/500 mg BID   Current Orders: Novolog Moderate Correction Scale/ SSI (0-15 units) TID AC + HS     Tradjenta 5 mg Daily    Solumedrol stopped--last dose given yest AM--now getting Prednisone 40 mg Daily.    MD- Note post-meal CBGs elevated likely due to the Prednisone.  Please consider adding low dose Novolog Meal Coverage: Novolog 3 units TID with meals  (Please add the following Hold Parameters: Hold if pt eats <50% of meal, Hold if pt NPO)     --Will follow patient during hospitalization--  Wyn Quaker RN, MSN, CDE Diabetes Coordinator Inpatient Glycemic Control Team Team Pager: [phone] (8a-5p)

## 2019-04-22 NOTE — Progress Notes (Signed)
PT Cancellation Note  Patient Details Name: Shawn Hayes MRN: [id number] DOB: 09/20/1955   Cancelled Treatment:    Reason Eval/Treat Not Completed: Other (comment).  Chart reviewed.  Pt's potassium noted to be up-trending to 5.3 this morning.  Pt PT guidelines for elevated potassium, exertional activity currently contra-indicated.  Will hold PT at this time and re-attempt PT treatment session at a later date/time as medically appropriate.  Leitha Bleak, PT 04/22/19, 8:49 AM

## 2019-04-22 NOTE — Progress Notes (Signed)
Patient ID: Shawn Hayes, male   DOB: February 12, 1956, 64 y.o.   MRN: [id number] [facility] PROGRESS NOTE  HYDE PERCLE [id number] DOB: [birth year] DOA: 04/17/2019 PCP: [name], MD  HPI/Subjective:Patient states he does have shortness of breath and chest pain when he lies back.  Feels more comfortable sitting up.  This morning's creatinine and potassium were up.  Objective: Vitals:   04/22/19 0818 04/22/19 1224  BP: (!) 150/97 (!) 141/95  Pulse: 67 64  Resp: 18 18  Temp: 98.6 F (37 C) 98.5 F (36.9 C)  SpO2: 99% 93%    Intake/Output Summary (Last 24 hours) at 04/22/2019 1614 Last data filed at 04/22/2019 1315 Gross per 24 hour  Intake 720 ml  Output 400 ml  Net 320 ml   Filed Weights   04/20/19 0345 04/21/19 1043 04/22/19 0504  Weight: 105.7 kg 103.3 kg 102.6 kg    ROS: Review of Systems  Constitutional: Negative for chills and fever.  Eyes: Negative for blurred vision.  Respiratory: Positive for shortness of breath. Negative for cough.   Cardiovascular: Positive for chest pain.  Gastrointestinal: Negative for abdominal pain, constipation, diarrhea, nausea and vomiting.  Genitourinary: Negative for dysuria.  Musculoskeletal: Positive for joint pain.  Neurological: Negative for dizziness and headaches.   Exam: Physical Exam  Constitutional: He is oriented to person, place, and time.  HENT:  Nose: No mucosal edema.  Mouth/Throat: No oropharyngeal exudate or posterior oropharyngeal edema.  Eyes: Conjunctivae and lids are normal.  Neck: Carotid bruit is not present.  Cardiovascular: S1 normal and S2 normal. Exam reveals no gallop.  No murmur heard. Respiratory: No respiratory distress. He has decreased breath sounds in the right lower field and the left lower field. He has no wheezes. He has no rhonchi. He has no rales.  GI: Soft. Bowel sounds are normal. There is no abdominal tenderness.  Musculoskeletal:     Right ankle: Swelling present.      Left ankle: Swelling present.  Lymphadenopathy:    He has no cervical adenopathy.  Neurological: He is alert and oriented to person, place, and time. No cranial nerve deficit.  Skin: Skin is warm. Nails show no clubbing.  Chronic lower extremity skin discoloration  Psychiatric: He has a normal mood and affect.      Data Reviewed: Basic Metabolic Panel: Recent Labs  Lab 04/18/19 0441 04/18/19 0441 04/19/19 0516 04/20/19 0430 04/21/19 0456 04/22/19 0447 04/22/19 1429  NA 138   < > 138 137 132* 134* 133*  K 3.8   < > 4.3 4.5 4.8 5.3* 5.1  CL 109   < > 108 100 100 100 101  CO2 23   < > 22 22 24 25 23   GLUCOSE 154*   < > 120* 156* 240* 255* 258*  BUN 26*   < > 29* 30* 36* 51* 49*  CREATININE 1.68*   < > 1.78* 1.99* 2.12* 2.20* 2.03*  CALCIUM 8.2*   < > 8.2* 8.4* 8.5* 8.7* 8.4*  MG 1.6*  --   --   --   --   --   --    < > = values in this interval not displayed.   Liver Function Tests: Recent Labs  Lab 04/17/19 2121  AST 17  ALT 12  ALKPHOS 106  BILITOT 0.5  PROT 6.7  ALBUMIN 2.7*   CBC: Recent Labs  Lab 04/17/19 2121  WBC 5.0  HGB 9.5*  HCT 30.7*  MCV 86.7  PLT  185   BNP (last 3 results) Recent Labs    04/17/19 2121  BNP 1,618.0*    CBG: Recent Labs  Lab 04/21/19 1142 04/21/19 1642 04/21/19 2122 04/22/19 0817 04/22/19 1224  GLUCAP 188* 240* 248* 188* 238*    Recent Results (from the past 240 hour(s))  Respiratory Panel by RT PCR (Flu A&B, Covid) - Nasopharyngeal Swab     Status: None   Collection Time: 04/18/19 12:49 AM   Specimen: Nasopharyngeal Swab  Result Value Ref Range Status   SARS Coronavirus 2 by RT PCR NEGATIVE NEGATIVE Final    Comment: (NOTE) SARS-CoV-2 target nucleic acids are NOT DETECTED. The SARS-CoV-2 RNA is generally detectable in upper respiratoy specimens during the acute phase of infection. The lowest concentration of SARS-CoV-2 viral copies this assay can detect is 131 copies/mL. A negative result does not preclude  SARS-Cov-2 infection and should not be used as the sole basis for treatment or other patient management decisions. A negative result may occur with  improper specimen collection/handling, submission of specimen other than nasopharyngeal swab, presence of viral mutation(s) within the areas targeted by this assay, and inadequate number of viral copies (<131 copies/mL). A negative result must be combined with clinical observations, patient history, and epidemiological information. The expected result is Negative. Fact Sheet for Patients:  PinkCheek.be Fact Sheet for Healthcare Providers:  GravelBags.it This test is not yet ap proved or cleared by the [place] FDA and  has been authorized for detection and/or diagnosis of SARS-CoV-2 by FDA under an Emergency Use Authorization (EUA). This EUA will remain  in effect (meaning this test can be used) for the duration of the COVID-19 declaration under Section 564(b)(1) of the Act, 21 U.S.C. section 360bbb-3(b)(1), unless the authorization is terminated or revoked sooner.    Influenza A by PCR NEGATIVE NEGATIVE Final   Influenza B by PCR NEGATIVE NEGATIVE Final    Comment: (NOTE) The Xpert Xpress SARS-CoV-2/FLU/RSV assay is intended as an aid in  the diagnosis of influenza from Nasopharyngeal swab specimens and  should not be used as a sole basis for treatment. Nasal washings and  aspirates are unacceptable for Xpert Xpress SARS-CoV-2/FLU/RSV  testing. Fact Sheet for Patients: PinkCheek.be Fact Sheet for Healthcare Providers: GravelBags.it This test is not yet approved or cleared by the [place] FDA and  has been authorized for detection and/or diagnosis of SARS-CoV-2 by  FDA under an Emergency Use Authorization (EUA). This EUA will remain  in effect (meaning this test can be used) for the duration of the  Covid-19  declaration under Section 564(b)(1) of the Act, 21  U.S.C. section 360bbb-3(b)(1), unless the authorization is  terminated or revoked. Performed at [facility], [address] [place], [place] [address]      Studies: No results found.  Scheduled Meds: . carvedilol  12.5 mg Oral BID WC  . enoxaparin (LOVENOX) injection  40 mg Subcutaneous Q24H  . famotidine  10 mg Oral Daily  . influenza vac split quadrivalent PF  0.5 mL Intramuscular Tomorrow-1000  . insulin aspart  0-15 Units Subcutaneous TID WC  . ipratropium-albuterol  3 mL Nebulization Q6H  . linagliptin  5 mg Oral Daily  . predniSONE  40 mg Oral Q breakfast  . rOPINIRole  0.5 mg Oral QHS  . sodium chloride flush  3 mL Intravenous Q12H  . sodium zirconium cyclosilicate  10 g Oral BID  . tamsulosin  0.4 mg Oral Daily   Continuous Infusions: . sodium chloride  Assessment/Plan:  1. Acute kidney injury on chronic kidney disease stage IIIb with hyperkalemia.  I held spironolactone, lisinopril, potassium, Lasix.  I dose Lokelma.  I gave a fluid bolus this morning.  This afternoon creatinine better but potassium still elevated.  Redosed Lokelma and recheck BMP in the morning.   2. Acute on chronic systolic congestive heart failure with anasarca current echocardiogram shows an EF of 30 to 35% which is better than the previous echocardiogram.  Holding Lasix, spironolactone and lisinopril this morning with acute kidney injury.  Weight and swelling has improved.  Chest pain likely costochondritis.  Should improve with steroid. 3. COPD exacerbation.  Change Solu-Medrol over to prednisone. 4. Type 2 diabetes mellitus.  Hemoglobin A1c elevated at 8.0 on sliding scale insulin holding glipizide Metformin.  Trial of Tradjenta.  Unfortunately medication management does not have Tradjenta and will likely have to go back on glipizide.  Patient has diarrhea with Metformin. 5. Generalized weakness physical therapy  evaluation 6. Restless leg syndrome.  Continue Requip at night.  7. Essential hypertension.  Better today after adjustment of medications. 8. BPH.  Patient complaining of difficulty getting his urine out and dribbling at the end of his urine stream.  Start Flomax and monitor.  Code Status:     Code Status Orders  (From admission, onward)         Start     Ordered   04/18/19 0059  Full code  Continuous     04/18/19 0102        Code Status History    This patient has a current code status but no historical code status.   Advance Care Planning Activity     Disposition Plan: With elevated potassium and creatinine I will recheck tomorrow morning likely over diuresis with Lasix.  We will give a Lasix holiday and hold spironolactone and lisinopril today and recheck things tomorrow.  Dosing Lokelma for hyperkalemia  Time spent: 26 minutes.  Spoke with Winfred Leeds at [facility]  [facility]

## 2019-04-23 LAB — BASIC METABOLIC PANEL
Anion gap: 7 (ref 5–15)
BUN: 50 mg/dL — ABNORMAL HIGH (ref 8–23)
CO2: 27 mmol/L (ref 22–32)
Calcium: 8.7 mg/dL — ABNORMAL LOW (ref 8.9–10.3)
Chloride: 100 mmol/L (ref 98–111)
Creatinine, Ser: 1.88 mg/dL — ABNORMAL HIGH (ref 0.61–1.24)
GFR calc Af Amer: 43 mL/min — ABNORMAL LOW (ref >60)
GFR calc non Af Amer: 37 mL/min — ABNORMAL LOW (ref >60)
Glucose, Bld: 309 mg/dL — ABNORMAL HIGH (ref 70–99)
Potassium: 4.4 mmol/L (ref 3.5–5.1)
Sodium: 134 mmol/L — ABNORMAL LOW (ref 135–145)

## 2019-04-23 LAB — GLUCOSE, CAPILLARY
Glucose-Capillary: 278 mg/dL — ABNORMAL HIGH (ref 70–99)
Glucose-Capillary: 250 mg/dL — ABNORMAL HIGH (ref 70–99)

## 2019-04-23 MED ORDER — ALBUTEROL SULFATE HFA 108 (90 BASE) MCG/ACT IN AERS: 2.0000 | INHALATION_SPRAY | Freq: Four times a day (QID) | RESPIRATORY_TRACT | 0 refills | Status: DC | PRN

## 2019-04-23 MED ORDER — CARVEDILOL 12.5 MG PO TABS: 12.5000 mg | ORAL_TABLET | Freq: Two times a day (BID) | ORAL | 0 refills | Status: DC

## 2019-04-23 MED ORDER — TIOTROPIUM BROMIDE MONOHYDRATE 18 MCG IN CAPS
18.0000 ug | ORAL_CAPSULE | Freq: Every morning | RESPIRATORY_TRACT | Status: DC
  Administered 2019-04-23: 10:00:00 18 ug via RESPIRATORY_TRACT
  Filled 2019-04-23: qty 5

## 2019-04-23 MED ORDER — TIOTROPIUM BROMIDE MONOHYDRATE 18 MCG IN CAPS: 18.0000 ug | ORAL_CAPSULE | Freq: Every day | RESPIRATORY_TRACT | 12 refills | Status: DC

## 2019-04-23 MED ORDER — BUDESONIDE-FORMOTEROL FUMARATE 160-4.5 MCG/ACT IN AERO: 2.0000 | INHALATION_SPRAY | Freq: Two times a day (BID) | RESPIRATORY_TRACT | 0 refills | Status: DC

## 2019-04-23 MED ORDER — FUROSEMIDE 40 MG PO TABS: ORAL_TABLET | ORAL | 0 refills | Status: DC

## 2019-04-23 MED ORDER — FUROSEMIDE 20 MG PO TABS
60.0000 mg | ORAL_TABLET | Freq: Every day | ORAL | Status: DC
  Filled 2019-04-23: qty 3

## 2019-04-23 MED ORDER — TAMSULOSIN HCL 0.4 MG PO CAPS: 0.4000 mg | ORAL_CAPSULE | Freq: Every day | ORAL | 0 refills | Status: DC

## 2019-04-23 MED ORDER — LISINOPRIL 5 MG PO TABS
5.0000 mg | ORAL_TABLET | Freq: Every day | ORAL | Status: DC
  Administered 2019-04-23: 5 mg via ORAL
  Filled 2019-04-23: qty 1

## 2019-04-23 MED ORDER — FUROSEMIDE 40 MG PO TABS
80.0000 mg | ORAL_TABLET | Freq: Every day | ORAL | Status: DC
  Administered 2019-04-23: 10:00:00 80 mg via ORAL
  Filled 2019-04-23: qty 2

## 2019-04-23 MED ORDER — PREDNISONE 20 MG PO TABS: ORAL_TABLET | ORAL | 0 refills | Status: DC

## 2019-04-23 NOTE — Plan of Care (Signed)
No acute events throughout the night. Patient has remained alert and oriented. His vitals have been stable. He's maintaining his oxygenation on room air. Appreciate RT assistance with nebulizer treatments. He has been adherent to medication therapies.  Intake and output monitored as ordered. He remains free of falls and injuries. Appropriately using walker for ambulation. Awaiting updates to plan of care.    Problem: Education: Goal: Knowledge of General Education information will improve Description: Including pain rating scale, medication(s)/side effects and non-pharmacologic comfort measures Outcome: Progressing   Problem: Health Behavior/Discharge Planning: Goal: Ability to manage health-related needs will improve Outcome: Progressing   Problem: Clinical Measurements: Goal: Ability to maintain clinical measurements within normal limits will improve Outcome: Progressing Goal: Will remain free from infection Outcome: Progressing Goal: Diagnostic test results will improve Outcome: Progressing Goal: Respiratory complications will improve Outcome: Progressing Goal: Cardiovascular complication will be avoided Outcome: Progressing   Problem: Activity: Goal: Risk for activity intolerance will decrease Outcome: Progressing   Problem: Nutrition: Goal: Adequate nutrition will be maintained Outcome: Progressing   Problem: Coping: Goal: Level of anxiety will decrease Outcome: Progressing   Problem: Elimination: Goal: Will not experience complications related to bowel motility Outcome: Progressing Goal: Will not experience complications related to urinary retention Outcome: Progressing   Problem: Pain Managment: Goal: General experience of comfort will improve Outcome: Progressing   Problem: Safety: Goal: Ability to remain free from injury will improve Outcome: Progressing   Problem: Skin Integrity: Goal: Risk for impaired skin integrity will decrease Outcome: Progressing    Problem: Education: Goal: Ability to demonstrate management of disease process will improve Outcome: Progressing Goal: Ability to verbalize understanding of medication therapies will improve Outcome: Progressing Goal: Individualized Educational Video(s) Outcome: Progressing   Problem: Activity: Goal: Capacity to carry out activities will improve Outcome: Progressing   Problem: Cardiac: Goal: Ability to achieve and maintain adequate cardiopulmonary perfusion will improve Outcome: Progressing

## 2019-04-23 NOTE — Progress Notes (Signed)
Written and verbal discharge instructions discussed with pt.  Discussed medication, follow-up appointments, CHF education and when to call MD or return to ER. Pt verbalized understanding. IV and tele DC'd. Home meds in pharmacy given back to pt. Belongings with pt. Awaiting sister in law to come pick him up.

## 2019-04-23 NOTE — Discharge Instructions (Signed)
Stop potasium supplements Hold spironolactone (aldactone) unitl follow up labs as outpatient  Heart Failure and Exercise Heart failure is a condition in which the heart does not fill or pump enough blood and oxygen to support your body and its functions. Heart failure is a long-term (chronic) condition. Living with heart failure can be challenging. However, following your health care provider's instructions about a healthy lifestyle may help improve your symptoms. This includes choosing the right exercise plan. Doing daily physical activity is important after a diagnosis of heart failure. You may have some activity restrictions, so talk to your health care provider before doing any exercises. What are the benefits of exercise? Exercise may:  Make your heart muscles stronger.  Lower your blood pressure.  Lower your cholesterol.  Help you lose weight.  Help your bones stay strong.  Improve your blood circulation.  Help your body use oxygen better. This relieves symptoms such as fatigue and shortness of breath.  Help your mental health by lowering the risk of depression and other problems.  Improve your quality of life.  Decrease your chance of hospital admission for heart failure. What is an exercise plan? An exercise plan is a set of specific exercises and training activities. You will work with your health care provider to create the exercise plan that works for you. The plan may include:  Different types of exercises and how to do them.  Cardiac rehabilitation exercises. These are supervised programs that are designed to strengthen your heart. What are strengthening exercises? Strengthening exercises are a type of physical activity that involves using resistance to improve your muscle strength. Strengthening exercises usually have repetitive motions. These types of exercises can include:  Lifting weights.  Using weight machines.  Using resistance tubes and bands.  Using  kettlebells.  Using your body weight, such as doing push-ups or squats. What are balance exercises? Balance exercises are another type of physical activity. They strengthen the muscles of the back, stomach, and pelvis (core muscles) and improve your balance. They can also lower your risk of falling. These types of exercises can include:  Standing on one leg.  Walking backward, sideways, and in a straight line.  Standing up after sitting, without using your hands.  Shifting your weight from one leg to the other.  Lifting one leg in front of you.  Doing tai chi. This is a type of exercise that uses slow movements and deep breathing. How can I increase my flexibility? Having better flexibility can keep you from falling. It can also lengthen your muscles, improve your range of motion, and help your joints. You can increase your flexibility by:  Doing tai chi.  Doing yoga.  Stretching. How much aerobic exercise should I get?  Aerobic exercises strengthen your breathing and circulation system and increase your body's use of oxygen. Examples of aerobic exercise include biking, walking, running, and swimming. Talk to your health care provider to find out how much aerobic exercise is safe for you.  To do these exercises:  Start exercising slowly, limiting the amount of time at first. You may need to start with 5 minutes of aerobic exercise every day.  Slowly add more minutes until you can safely do at least 30 minutes of exercise at least 4 days a week. Summary  Daily physical activity is important after a diagnosis of heart failure.  Exercise can make your heart muscles stronger. It also offers other benefits that will improve your health.  Talk to your health care  provider before doing any exercises. This information is not intended to replace advice given to you by your health care provider. Make sure you discuss any questions you have with your health care provider. Document  Revised: 07/01/2016 Document Reviewed: 06/28/2016 Elsevier Patient Education  [place].   Heart Failure Eating Plan Heart failure, also called congestive heart failure, occurs when your heart does not pump blood well enough to meet your body's needs for oxygen-rich blood. Heart failure is a long-term (chronic) condition. Living with heart failure can be challenging. However, following your health care provider's instructions about a healthy lifestyle and working with a diet and nutrition specialist (dietitian) to choose the right foods may help to improve your symptoms. What are tips for following this plan? Reading food labels  Check food labels for the amount of sodium per serving. Choose foods that have less than 140 mg (milligrams) of sodium in each serving.  Check food labels for the number of calories per serving. This is important if you need to limit your daily calorie intake to lose weight.  Check food labels for the serving size. If you eat more than one serving, you will be eating more sodium and calories than what is listed on the label.  Look for foods that are labeled as "sodium-free," "very low sodium," or "low sodium." ? Foods labeled as "reduced sodium" or "lightly salted" may still have more sodium than what is recommended for you. Cooking  Avoid adding salt when cooking. Ask your health care provider or dietitian before using salt substitutes.  Season food with salt-free seasonings, spices, or herbs. Check the label of seasoning mixes to make sure they do not contain salt.  Cook with heart-healthy oils, such as olive, canola, soybean, or sunflower oil.  Do not fry foods. Cook foods using low-fat methods, such as baking, boiling, grilling, and broiling.  Limit unhealthy fats when cooking by: ? Removing the skin from poultry, such as chicken. ? Removing all visible fats from meats. ? Skimming the fat off from stews, soups, and gravies before serving  them. Meal planning   Limit your intake of: ? Processed, canned, or pre-packaged foods. ? Foods that are high in trans fat, such as fried foods. ? Sweets, desserts, sugary drinks, and other foods with added sugar. ? Full-fat dairy products, such as whole milk.  Eat a balanced diet that includes: ? 4-5 servings of fruit each day and 4-5 servings of vegetables each day. At each meal, try to fill half of your plate with fruits and vegetables. ? Up to 6-8 servings of whole grains each day. ? Up to 2 servings of lean meat, poultry, or fish each day. One serving of meat is equal to 3 oz. This is about the same size as a deck of cards. ? 2 servings of low-fat dairy each day. ? Heart-healthy fats. Healthy fats called omega-3 fatty acids are found in foods such as flaxseed and cold-water fish like sardines, salmon, and mackerel.  Aim to eat 25-35 g (grams) of fiber a day. Foods that are high in fiber include apples, broccoli, carrots, beans, peas, and whole grains.  Do not add salt or condiments that contain salt (such as soy sauce) to foods before eating.  When eating at a restaurant, ask that your food be prepared with less salt or no salt, if possible.  Try to eat 2 or more vegetarian meals each week.  Eat more home-cooked food and eat less restaurant, buffet, and fast  food. General information  Do not eat more than 2,300 mg of salt (sodium) a day. The amount of sodium that is recommended for you may be lower, depending on your condition.  Maintain a healthy body weight as directed. Ask your health care provider what a healthy weight is for you. ? Check your weight every day. ? Work with your health care provider and dietitian to make a plan that is right for you to lose weight or maintain your current weight.  Limit how much fluid you drink. Ask your health care provider or dietitian how much fluid you can have each day.  Limit or avoid alcohol as told by your health care provider or  dietitian. Recommended foods The items listed may not be a complete list. Talk with your dietitian about what dietary choices are best for you. Fruits All fresh, frozen, and canned fruits. Dried fruits, such as raisins, prunes, and cranberries. Vegetables All fresh vegetables. Vegetables that are frozen without sauce or added salt. Low-sodium or sodium-free canned vegetables. Grains Bread with less than 80 mg of sodium per slice. Whole-wheat pasta, quinoa, and brown rice. Oats and oatmeal. Barley. [place]. Grits and cream of wheat. Whole-grain and whole-wheat cold cereal. Meats and other protein foods Lean cuts of meat. Skinless chicken and [place]. Fish with high omega-3 fatty acids, such as salmon, sardines, and other cold-water fishes. Eggs. Dried beans, peas, and edamame. Unsalted nuts and nut butters. Dairy Low-fat or nonfat (skim) milk and dried milk. Rice milk, soy milk, and almond milk. Low-fat or nonfat yogurt. Small amounts of reduced-sodium block cheese. Low-sodium cottage cheese. Fats and oils Olive, canola, soybean, flaxseed, or sunflower oil. Avocado. Sweets and desserts Apple sauce. Granola bars. Sugar-free pudding and gelatin. Frozen fruit bars. Seasoning and other foods Fresh and dried herbs. Lemon or lime juice. Vinegar. Low-sodium ketchup. Salt-free marinades, salad dressings, sauces, and seasonings. The items listed above may not be a complete list of foods and beverages you can eat. Contact a dietitian for more information. Foods to avoid The items listed may not be a complete list. Talk with your dietitian about what dietary choices are best for you. Fruits Fruits that are dried with sodium-containing preservatives. Vegetables Canned vegetables. Frozen vegetables with sauce or seasonings. Creamed vegetables. [place] fries. Onion rings. Pickled vegetables and sauerkraut. Grains Bread with more than 80 mg of sodium per slice. Hot or cold cereal with more than 140 mg sodium  per serving. Salted pretzels and crackers. Pre-packaged breadcrumbs. Bagels, croissants, and biscuits. Meats and other protein foods Ribs and chicken wings. Bacon, ham, pepperoni, bologna, salami, and packaged luncheon meats. Hot dogs, bratwurst, and sausage. Canned meat. Smoked meat and fish. Salted nuts and seeds. Dairy Whole milk, half-and-half, and cream. Buttermilk. Processed cheese, cheese spreads, and cheese curds. Regular cottage cheese. Feta cheese. Shredded cheese. String cheese. Fats and oils Butter, lard, shortening, ghee, and bacon fat. Canned and packaged gravies. Seasoning and other foods Onion salt, garlic salt, table salt, and sea salt. Marinades. Regular salad dressings. Relishes, pickles, and olives. Meat flavorings and tenderizers, and bouillon cubes. Horseradish, ketchup, and mustard. Worcestershire sauce. Teriyaki sauce, soy sauce (including reduced sodium). Hot sauce and Tabasco sauce. Steak sauce, fish sauce, oyster sauce, and cocktail sauce. Taco seasonings. Barbecue sauce. Tartar sauce. The items listed above may not be a complete list of foods and beverages you should avoid. Contact a dietitian for more information. Summary  A heart failure eating plan includes changes that limit your intake of sodium  and unhealthy fat, and it may help you lose weight or maintain a healthy weight. Your health care provider may also recommend limiting how much fluid you drink.  Most people with heart failure should eat no more than 2,300 mg of salt (sodium) a day. The amount of sodium that is recommended for you may be lower, depending on your condition.  Contact your health care provider or dietitian before making any major changes to your diet. This information is not intended to replace advice given to you by your health care provider. Make sure you discuss any questions you have with your health care provider. Document Revised: 04/12/2018 Document Reviewed: 07/01/2016 Elsevier Patient  Education  [place].   Heart Failure, Diagnosis  Heart failure means that your heart is not able to pump blood in the right way. This makes it hard for your body to work well. Heart failure is usually a long-term (chronic) condition. You must take good care of yourself and follow your treatment plan from your doctor. What are the causes? This condition may be caused by:  High blood pressure.  Build up of cholesterol and fat in the arteries.  Heart attack. This injures the heart muscle.  Heart valves that do not open and close properly.  Damage of the heart muscle. This is also called cardiomyopathy.  Lung disease.  Abnormal heart rhythms. What increases the risk? The risk of heart failure goes up as a person ages. This condition is also more likely to develop in people who:  Are overweight.  Are male.  Smoke or chew tobacco.  Abuse alcohol or illegal drugs.  Have taken medicines that can damage the heart.  Have diabetes.  Have abnormal heart rhythms.  Have thyroid problems.  Have low blood counts (anemia). What are the signs or symptoms? Symptoms of this condition include:  Shortness of breath.  Coughing.  Swelling of the feet, ankles, legs, or belly.  Losing weight for no reason.  Trouble breathing.  Waking from sleep because of the need to sit up and get more air.  Rapid heartbeat.  Being very tired.  Feeling dizzy, or feeling like you may pass out (faint).  Having no desire to eat.  Feeling like you may vomit (nauseous).  Peeing (urinating) more at night.  Feeling confused. How is this treated?     This condition may be treated with:  Medicines. These can be given to treat blood pressure and to make the heart muscles stronger.  Changes in your daily life. These may include eating a healthy diet, staying at a healthy body weight, quitting tobacco and illegal drug use, or doing exercises.  Surgery. Surgery can be done to open  blocked valves, or to put devices in the heart, such as pacemakers.  A donor heart (heart transplant). You will receive a healthy heart from a donor. Follow these instructions at home:  Treat other conditions as told by your doctor. These may include high blood pressure, diabetes, thyroid disease, or abnormal heart rhythms.  Learn as much as you can about heart failure.  Get support as you need it.  Keep all follow-up visits as told by your doctor. This is important. Summary  Heart failure means that your heart is not able to pump blood in the right way.  This condition is caused by high blood pressure, heart attack, or damage of the heart muscle.  Symptoms of this condition include shortness of breath and swelling of the feet, ankles, legs, or  belly. You may also feel very tired or feel like you may vomit.  You may be treated with medicines, surgery, or changes in your daily life.  Treat other health conditions as told by your doctor. This information is not intended to replace advice given to you by your health care provider. Make sure you discuss any questions you have with your health care provider. Document Revised: 05/04/2018 Document Reviewed: 05/04/2018 Elsevier Patient Education  [place].

## 2019-04-23 NOTE — Progress Notes (Signed)
Physical Therapy Treatment Patient Details Name: Shawn Hayes MRN: [id number] DOB: Apr 23, 1955 Today's Date: 04/23/2019    History of Present Illness Pt is 64 y/o M with PMH idiopathic CMY (EF 25% on Echo 11/18/2018), moderate mitral valve regurgitation, T2DM, CKD3, and anemia. Pt presented to ED d/t SOB and LE swelling. Pt with acute on chronic HFrEF exacerbation.    PT Comments    Pt sitting in chair upon PT arrival and reports going home today.  Pt able to ambulate around nursing loop with RW CGA (narrow BOS noted but pt with steady step through gait pattern).  Discussed trialing pt without RW but pt reporting he gets fatigued with walking and needs it for support.  Pt declined to trial stairs d/t being tired after walking and pt did not feel like he would have any issues with stairs navigation.  Plan for discharge home today.    Follow Up Recommendations  Home health PT     Equipment Recommendations  Rolling walker with 5" wheels    Recommendations for Other Services       Precautions / Restrictions Precautions Precautions: Fall Restrictions Weight Bearing Restrictions: No    Mobility  Bed Mobility                  Transfers Overall transfer level: Needs assistance Equipment used: None Transfers: Sit to/from Stand;Stand Pivot Transfers Sit to Stand: Supervision Stand pivot transfers: Supervision       General transfer comment: steady without AD but pt holding onto stable furniture in room as needed  Ambulation/Gait Ambulation/Gait assistance: Min guard Gait Distance (Feet): 240 Feet Assistive device: Rolling walker (2 wheeled) Gait Pattern/deviations: Narrow base of support;Step-through pattern Gait velocity: decreased   General Gait Details: steady with walker   Stairs             Wheelchair Mobility    Modified Rankin (Stroke Patients Only)       Balance Overall balance assessment: Needs assistance Sitting-balance support: No upper  extremity supported;Feet supported Sitting balance-Leahy Scale: Normal Sitting balance - Comments: steady sitting reaching outside BOS   Standing balance support: Bilateral upper extremity supported;During functional activity Standing balance-Leahy Scale: Good Standing balance comment: steady with ambulation using RW                            Cognition Arousal/Alertness: Awake/alert Behavior During Therapy: WFL for tasks assessed/performed Overall Cognitive Status: Within Functional Limits for tasks assessed                                        Exercises      General Comments  Pt agreeable to PT session.      Pertinent Vitals/Pain Pain Assessment: Faces Faces Pain Scale: No hurt Pain Intervention(s): Limited activity within patient's tolerance;Monitored during session;Repositioned  Vitals (HR and O2 on room air) stable and WFL throughout treatment session.    Home Living                      Prior Function            PT Goals (current goals can now be found in the care plan section) Acute Rehab PT Goals Patient Stated Goal: to go home PT Goal Formulation: With patient Time For Goal Achievement: 05/02/19 Potential to Achieve Goals: Good Progress towards  PT goals: Progressing toward goals    Frequency    Min 2X/week      PT Plan Current plan remains appropriate    Co-evaluation              AM-PAC PT "6 Clicks" Mobility   Outcome Measure  Help needed turning from your back to your side while in a flat bed without using bedrails?: None Help needed moving from lying on your back to sitting on the side of a flat bed without using bedrails?: None Help needed moving to and from a bed to a chair (including a wheelchair)?: A Little Help needed standing up from a chair using your arms (e.g., wheelchair or bedside chair)?: A Little Help needed to walk in hospital room?: A Little Help needed climbing 3-5 steps with a  railing? : A Little 6 Click Score: 20    End of Session Equipment Utilized During Treatment: Gait belt Activity Tolerance: Patient tolerated treatment well Patient left: with call bell/phone within reach(sitting edge of bed) Nurse Communication: Mobility status;Precautions PT Visit Diagnosis: Muscle weakness (generalized) (M62.81);Difficulty in walking, not elsewhere classified (R26.2)     Time: [id number] PT Time Calculation (min) (ACUTE ONLY): 11 min  Charges:  $Therapeutic Activity: 8-22 mins                     Leitha Bleak, PT 04/23/19, 12:02 PM

## 2019-04-23 NOTE — TOC Transition Note (Signed)
Transition of Care [facility]) - CM/SW Discharge Note   Patient Details  Name: HOWARD BRUER MRN: [id number] Date of Birth: Mar 10, 1955  Transition of Care [facility]) CM/SW Contact:  Victorino Dike, RN Phone Number: 04/23/2019, 9:37 AM   Clinical Narrative:     Patient will discharge home with his brother.  His brother agrees to take patient to outpatient rehab.  RW was provided by donation to [facility].  Medications have been sent to Medication Management, Brother Percell Miller is familiar with this and will help patient with application once they receive it.  Brother is also going to help brother complete his medicare paperwork. No further TOC needs at this time, please re-consult for new needs.    Final next level of care: OP Rehab Barriers to Discharge: Barriers Resolved   Patient Goals and CMS Choice Patient states their goals for this hospitalization and ongoing recovery are:: To go return home and get home health.  Patient stated he does not want to go to a facility.      Discharge Placement                       Discharge Plan and Services     Post Acute Care Choice: Home Health                               Social Determinants of Health (SDOH) Interventions     Readmission Risk Interventions No flowsheet data found.

## 2019-04-23 NOTE — Discharge Summary (Signed)
[facility] [place] NAME: Shawn Hayes    MR#:  [id number]  DATE OF BIRTH:  02/18/1956  DATE OF ADMISSION:  04/17/2019 ADMITTING PHYSICIAN: Lenore Cordia, MD  DATE OF DISCHARGE: 04/23/2019  PRIMARY CARE PHYSICIAN: [name], MD    ADMISSION DIAGNOSIS:  Acute pulmonary edema (HCC) [J81.0] Penile edema [N48.89] Peripheral edema [R60.9] Elevated troponin level [R77.8] Demand ischemia (HCC) [I24.8] Scrotal edema [N50.89] Chronic kidney disease, unspecified CKD stage [N18.9] Acute on chronic HFrEF (heart failure with reduced ejection fraction) (HCC) [I50.23] Acute on chronic congestive heart failure, unspecified heart failure type ([place]) [I50.9]  DISCHARGE DIAGNOSIS:  Principal Problem:   Acute on chronic HFrEF (heart failure with reduced ejection fraction) (HCC) Active Problems:   Normocytic anemia   Idiopathic cardiomyopathy (HCC)   Controlled type 2 diabetes mellitus without complication ([place])   CKD stage 3 due to type 2 diabetes mellitus ([place])   Essential hypertension   Acute kidney injury superimposed on CKD (HCC)   COPD with acute exacerbation (HCC)   Benign prostatic hyperplasia with urinary hesitancy   Hyperkalemia   SECONDARY DIAGNOSIS:   Past Medical History:  Diagnosis Date  . Anemia   . Controlled type 2 diabetes mellitus without complication ([place])   . Idiopathic cardiomyopathy ([place])     HOSPITAL COURSE:   1.  Acute on chronic systolic congestive heart failure with anasarca.  Repeat echocardiogram this hospital stay showed an EF of 30 to 35% which is better than previous echocardiogram.  Patient was diuresed with IV Lasix and then Lasix was held 1 day secondary to overdiuresis and restarted Lasix 80 mg daily upon going home.  We will give lisinopril 5 mg daily and Coreg titrated up to 12.5 mg twice daily.  Weights and swelling has improved.  Spironolactone held secondary to hyperkalemia with the acute  kidney injury.  I will continue to hold spironolactone at this time.  Can consider restarting low-dose as outpatient. 2.  Acute kidney injury on chronic kidney disease stage IIIb with hyperkalemia.  I had to hold lisinopril and spironolactone and potassium supplementation.  I will continue to hold spironolactone and potassium supplementation at this time.  I had to give [facility].  Can restart lisinopril at this point since potassium is better.  Recheck a BMP as outpatient.  Creatinine down to 1.88 upon discharge. 3.  COPD exacerbation.  I will give 5 days of prednisone.  Also prescribed Spiriva with hospital to home program, albuterol and Symbicort through medication management 4.  Type 2 diabetes mellitus.  Hemoglobin A1c elevated 8.0.  Unfortunately medication management does not have Tradjenta and will have to go back on glipizide.  I stop Metformin since he has diarrhea with that. 5.  Generalized weakness.  Did well with physical therapy.  Family did not want home health. 6.  Restless leg syndrome continue Requip at night 7.  Essential hypertension.  Blood pressure variable and went up a little bit after stopping spironolactone but hopefully restarting lisinopril blood pressure will trend better. 8.  BPH.  Patient was started on Flomax because the patient was complaining about difficulty getting his urine out and dribbling at the end of his urine stream.  Symptoms have improved with starting Flomax I will continue this medication. 9.  Anemia follow-up as outpatient  DISCHARGE CONDITIONS:  Satisfactory  CONSULTS OBTAINED:  None  DRUG ALLERGIES:  No Known Allergies  DISCHARGE MEDICATIONS:   Allergies as of 04/23/2019   No  Known Allergies     Medication List    STOP taking these medications   glipiZIDE-metformin 5-500 MG tablet Commonly known as: METAGLIP   potassium chloride 10 MEQ tablet Commonly known as: KLOR-CON   spironolactone 25 MG tablet Commonly known as: ALDACTONE      TAKE these medications   albuterol 108 (90 Base) MCG/ACT inhaler Commonly known as: VENTOLIN HFA Inhale 2 puffs into the lungs every 6 (six) hours as needed for wheezing or shortness of breath.   budesonide-formoterol 160-4.5 MCG/ACT inhaler Commonly known as: Symbicort Inhale 2 puffs into the lungs in the morning and at bedtime.   carvedilol 12.5 MG tablet Commonly known as: COREG Take 1 tablet (12.5 mg total) by mouth 2 (two) times daily with a meal. What changed:   medication strength  how much to take   furosemide 40 MG tablet Commonly known as: LASIX 2 tabs po every morning What changed:   how much to take  how to take this  when to take this  additional instructions   glipiZIDE 5 MG tablet Commonly known as: GLUCOTROL Take 1 tablet (5 mg total) by mouth 2 (two) times daily.   lisinopril 5 MG tablet Commonly known as: ZESTRIL Take 1 tablet (5 mg total) by mouth daily.   predniSONE 20 MG tablet Commonly known as: DELTASONE 2 tabs po daily for two more days   rOPINIRole 0.5 MG tablet Commonly known as: REQUIP Take 1 tablet (0.5 mg total) by mouth at bedtime.   tamsulosin 0.4 MG Caps capsule Commonly known as: FLOMAX Take 1 capsule (0.4 mg total) by mouth daily.   tiotropium 18 MCG inhalation capsule Commonly known as: SPIRIVA Place 1 capsule (18 mcg total) into inhaler and inhale daily.        DISCHARGE INSTRUCTIONS:   Follow-up PMD 5 days Follow-up Dr. Ubaldo Glassing cardiology in 2 weeks Follow-up CHF clinic  If you experience worsening of your admission symptoms, develop shortness of breath, life threatening emergency, suicidal or homicidal thoughts you must seek medical attention immediately by calling 911 or calling your MD immediately  if symptoms less severe.  You Must read complete instructions/literature along with all the possible adverse reactions/side effects for all the Medicines you take and that have been prescribed to you. Take any new  Medicines after you have completely understood and accept all the possible adverse reactions/side effects.   Please note  You were cared for by a hospitalist during your hospital stay. If you have any questions about your discharge medications or the care you received while you were in the hospital after you are discharged, you can call the unit and asked to speak with the hospitalist on call if the hospitalist that took care of you is not available. Once you are discharged, your primary care physician will handle any further medical issues. Please note that NO REFILLS for any discharge medications will be authorized once you are discharged, as it is imperative that you return to your primary care physician (or establish a relationship with a primary care physician if you do not have one) for your aftercare needs so that they can reassess your need for medications and monitor your lab values.    Today   CHIEF COMPLAINT:   Chief Complaint  Patient presents with  . Leg Swelling    HISTORY OF PRESENT ILLNESS:  Shawn Hayes  is a 65 y.o. male came in with scrotal and leg swelling   VITAL SIGNS:  Blood pressure Marland Kitchen)  161/93, pulse 66, temperature 98 F (36.7 C), resp. rate 18, height 6\' 1"  (1.854 m), weight 102.3 kg, SpO2 96 %.   PHYSICAL EXAMINATION:  GENERAL:  64 y.o.-year-old patient lying in the bed with no acute distress.  EYES: Pupils equal, round, reactive to light and accommodation. No scleral icterus. Extraocular muscles intact.  HEENT: Head atraumatic, normocephalic. Oropharynx and nasopharynx clear.  NECK:  Supple, no jugular venous distention. No thyroid enlargement, no tenderness.  LUNGS: Decreased breath sounds bilateral bases, no wheezing, rales,rhonchi or crepitation. No use of accessory muscles of respiration.  CARDIOVASCULAR: S1, S2 normal. No murmurs, rubs, or gallops.  ABDOMEN: Soft, non-tender, non-distended. EXTREMITIES: 2+ pedal edema.  NEUROLOGIC: Cranial nerves  II through XII are intact. Muscle strength 5/5 in all extremities. Sensation intact. Gait not checked.  PSYCHIATRIC: The patient is alert and oriented x 3.    DATA REVIEW:   CBC Recent Labs  Lab 04/17/19 2121  WBC 5.0  HGB 9.5*  HCT 30.7*  PLT 185    Chemistries  Recent Labs  Lab 04/17/19 2121 04/17/19 2121 04/18/19 0441 04/19/19 0516 04/23/19 0547  NA 135   < > 138   < > 134*  K 4.1   < > 3.8   < > 4.4  CL 106   < > 109   < > 100  CO2 19*   < > 23   < > 27  GLUCOSE 311*   < > 154*   < > 309*  BUN 26*   < > 26*   < > 50*  CREATININE 1.81*   < > 1.68*   < > 1.88*  CALCIUM 8.0*   < > 8.2*   < > 8.7*  MG  --   --  1.6*  --   --   AST 17  --   --   --   --   ALT 12  --   --   --   --   ALKPHOS 106  --   --   --   --   BILITOT 0.5  --   --   --   --    < > = values in this interval not displayed.    Microbiology Results  Results for orders placed or performed during the hospital encounter of 04/17/19  Respiratory Panel by RT PCR (Flu A&B, Covid) - Nasopharyngeal Swab     Status: None   Collection Time: 04/18/19 12:49 AM   Specimen: Nasopharyngeal Swab  Result Value Ref Range Status   SARS Coronavirus 2 by RT PCR NEGATIVE NEGATIVE Final    Comment: (NOTE) SARS-CoV-2 target nucleic acids are NOT DETECTED. The SARS-CoV-2 RNA is generally detectable in upper respiratoy specimens during the acute phase of infection. The lowest concentration of SARS-CoV-2 viral copies this assay can detect is 131 copies/mL. A negative result does not preclude SARS-Cov-2 infection and should not be used as the sole basis for treatment or other patient management decisions. A negative result may occur with  improper specimen collection/handling, submission of specimen other than nasopharyngeal swab, presence of viral mutation(s) within the areas targeted by this assay, and inadequate number of viral copies (<131 copies/mL). A negative result must be combined with clinical observations,  patient history, and epidemiological information. The expected result is Negative. Fact Sheet for Patients:  PinkCheek.be Fact Sheet for Healthcare Providers:  GravelBags.it This test is not yet ap proved or cleared by the [place] FDA and  has been  authorized for detection and/or diagnosis of SARS-CoV-2 by FDA under an Emergency Use Authorization (EUA). This EUA will remain  in effect (meaning this test can be used) for the duration of the COVID-19 declaration under Section 564(b)(1) of the Act, 21 U.S.C. section 360bbb-3(b)(1), unless the authorization is terminated or revoked sooner.    Influenza A by PCR NEGATIVE NEGATIVE Final   Influenza B by PCR NEGATIVE NEGATIVE Final    Comment: (NOTE) The Xpert Xpress SARS-CoV-2/FLU/RSV assay is intended as an aid in  the diagnosis of influenza from Nasopharyngeal swab specimens and  should not be used as a sole basis for treatment. Nasal washings and  aspirates are unacceptable for Xpert Xpress SARS-CoV-2/FLU/RSV  testing. Fact Sheet for Patients: PinkCheek.be Fact Sheet for Healthcare Providers: GravelBags.it This test is not yet approved or cleared by the [place] FDA and  has been authorized for detection and/or diagnosis of SARS-CoV-2 by  FDA under an Emergency Use Authorization (EUA). This EUA will remain  in effect (meaning this test can be used) for the duration of the  Covid-19 declaration under Section 564(b)(1) of the Act, 21  U.S.C. section 360bbb-3(b)(1), unless the authorization is  terminated or revoked. Performed at [facility], [address] [place], [place] [address]      Management plans discussed with the patient, family and they are in agreement.  CODE STATUS:     Code Status Orders  (From admission, onward)         Start     Ordered   04/18/19 0059  Full code   Continuous     04/18/19 0102        Code Status History    This patient has a current code status but no historical code status.   Advance Care Planning Activity      TOTAL TIME TAKING CARE OF THIS PATIENT: 34 minutes.    [name] M.D on 04/23/2019 at 1:54 PM  Between 7am to 6pm - Pager - [phone]  After 6pm go to [email or web address] - password [email or web address]  [facility]  CC: Primary care physician; [name], MD

## 2019-04-26 ENCOUNTER — Telehealth: Payer: Self-pay | Admitting: Family

## 2019-04-26 NOTE — Telephone Encounter (Signed)
Spoke with patient to follow up with him since his recent hospital visit. Patient stated hes doing ok but over did it yesterday so is home resting today. Pt stated he is now following a low sodium diet, checking his weight daily, and taking medications without an issues. He no longer is having any symptoms and things things are finally "Under Control". We scheduled him a new patient CHF Clinic appt with [place] for 3/2 which he confirmed!   Alyse Low, [place]

## 2019-04-30 ENCOUNTER — Encounter: Payer: Self-pay | Admitting: Family

## 2019-04-30 ENCOUNTER — Ambulatory Visit: Payer: Self-pay | Attending: Family | Admitting: Family

## 2019-04-30 ENCOUNTER — Other Ambulatory Visit: Payer: Self-pay

## 2019-04-30 VITALS — BP 165/88 | HR 78 | Resp 18 | Ht 73.0 in | Wt 214.0 lb

## 2019-04-30 DIAGNOSIS — Z87891 Personal history of nicotine dependence: Secondary | ICD-10-CM | POA: Insufficient documentation

## 2019-04-30 DIAGNOSIS — Z7951 Long term (current) use of inhaled steroids: Secondary | ICD-10-CM | POA: Insufficient documentation

## 2019-04-30 DIAGNOSIS — N400 Enlarged prostate without lower urinary tract symptoms: Secondary | ICD-10-CM | POA: Insufficient documentation

## 2019-04-30 DIAGNOSIS — J441 Chronic obstructive pulmonary disease with (acute) exacerbation: Secondary | ICD-10-CM | POA: Insufficient documentation

## 2019-04-30 DIAGNOSIS — N189 Chronic kidney disease, unspecified: Secondary | ICD-10-CM | POA: Insufficient documentation

## 2019-04-30 DIAGNOSIS — Z79899 Other long term (current) drug therapy: Secondary | ICD-10-CM | POA: Insufficient documentation

## 2019-04-30 DIAGNOSIS — I5022 Chronic systolic (congestive) heart failure: Secondary | ICD-10-CM | POA: Insufficient documentation

## 2019-04-30 DIAGNOSIS — I10 Essential (primary) hypertension: Secondary | ICD-10-CM

## 2019-04-30 DIAGNOSIS — I130 Hypertensive heart and chronic kidney disease with heart failure and stage 1 through stage 4 chronic kidney disease, or unspecified chronic kidney disease: Secondary | ICD-10-CM | POA: Insufficient documentation

## 2019-04-30 DIAGNOSIS — Z7984 Long term (current) use of oral hypoglycemic drugs: Secondary | ICD-10-CM | POA: Insufficient documentation

## 2019-04-30 DIAGNOSIS — I429 Cardiomyopathy, unspecified: Secondary | ICD-10-CM | POA: Insufficient documentation

## 2019-04-30 DIAGNOSIS — E1122 Type 2 diabetes mellitus with diabetic chronic kidney disease: Secondary | ICD-10-CM | POA: Insufficient documentation

## 2019-04-30 DIAGNOSIS — E875 Hyperkalemia: Secondary | ICD-10-CM | POA: Insufficient documentation

## 2019-04-30 LAB — GLUCOSE, CAPILLARY: Glucose-Capillary: 404 mg/dL — ABNORMAL HIGH (ref 70–99)

## 2019-04-30 MED ORDER — SACUBITRIL-VALSARTAN 24-26 MG PO TABS: 1.0000 | ORAL_TABLET | Freq: Two times a day (BID) | ORAL | 3 refills | Status: DC

## 2019-04-30 NOTE — Progress Notes (Signed)
Patient ID: Shawn Hayes, male    DOB: 03-May-1955, 64 y.o.   MRN: [id number]  HPI  Shawn Hayes is a 64 y/o male with a history of DM, anemia, HTN, CKD, COPD, current tobacco use and chronic heart failure.   Echo report from 04/18/19 reviewed and showed an EF of 30-35% along with trivial Shawn and moderately elevated PA pressure.  Admitted 04/17/19 due to acute on chronic heart failure with anasarca. Initially given IV lasix and then transitioned to oral diuretics. Spironolactone was held due to hyperkalemia. Given prednisone for COPD exacerbation. Discharged after 6 days.   He presents today for his initial visit with a chief complaint of moderate fatigue upon minimal exertion. He describes this as chronic in nature having been present for many months. He has associated shortness of breath, light-headedness, chest pain, pedal edema, diarrhea and difficulty sleeping along with this. He denies any abdominal distention, palpitations, cough or weight gain.   Says that his diarrhea is his biggest complaint and that his metformin is currently on hold. Says that he drank a regular soda today and he normally drinks sugar free drinks.   Past Medical History:  Diagnosis Date  . Anemia   . BPH (benign prostatic hyperplasia)   . CHF (congestive heart failure) ([place])   . Chronic kidney disease   . Controlled type 2 diabetes mellitus without complication ([place])   . COPD (chronic obstructive pulmonary disease) ([place])   . Hypertension   . Idiopathic cardiomyopathy ([place])    Past Surgical History:  Procedure Laterality Date  . APPENDECTOMY    . TONSILLECTOMY     Family History  Problem Relation Age of Onset  . Cancer Brother    Social History   Tobacco Use  . Smoking status: Former Smoker    Types: Cigarettes  . Smokeless tobacco: Never Used  . Tobacco comment: Pt states he quit 1 week ago  Substance Use Topics  . Alcohol use: Not Currently   No Known Allergies Prior to Admission medications    Medication Sig Start Date End Date Taking? Authorizing Provider  albuterol (VENTOLIN HFA) 108 (90 Base) MCG/ACT inhaler Inhale 2 puffs into the lungs every 6 (six) hours as needed for wheezing or shortness of breath. 04/23/19  Yes [name], [name], MD  budesonide-formoterol [facility]) 160-4.5 MCG/ACT inhaler Inhale 2 puffs into the lungs in the morning and at bedtime. 04/23/19  Yes [name], MD  carvedilol (COREG) 12.5 MG tablet Take 1 tablet (12.5 mg total) by mouth 2 (two) times daily with a meal. 04/23/19  Yes [name], [name], MD  furosemide (LASIX) 40 MG tablet 2 tabs po every morning 04/23/19  Yes [name], [name], MD  glipiZIDE (GLUCOTROL) 5 MG tablet Take 1 tablet (5 mg total) by mouth 2 (two) times daily. 04/19/19 04/18/20 Yes [name], [name], MD  lisinopril (ZESTRIL) 5 MG tablet Take 1 tablet (5 mg total) by mouth daily. 04/19/19  Yes [name], [name], MD  rOPINIRole (REQUIP) 0.5 MG tablet Take 1 tablet (0.5 mg total) by mouth at bedtime. 04/19/19  Yes [name], [name], MD  tamsulosin (FLOMAX) 0.4 MG CAPS capsule Take 1 capsule (0.4 mg total) by mouth daily. 04/23/19  Yes [name], MD  tiotropium (SPIRIVA) 18 MCG inhalation capsule Place 1 capsule (18 mcg total) into inhaler and inhale daily. 04/23/19  Yes [name], MD     Review of Systems  Constitutional: Positive for fatigue (easily). Negative for appetite change.  HENT: Negative for congestion, postnasal drip and sore  throat.   Eyes: Negative.   Respiratory: Positive for shortness of breath (easily). Negative for cough.   Cardiovascular: Positive for chest pain (at times) and leg swelling. Negative for palpitations.  Gastrointestinal: Positive for diarrhea. Negative for abdominal distention and abdominal pain.  Endocrine: Negative.   Genitourinary: Negative.   Musculoskeletal: Negative for back pain and neck pain.  Skin: Negative.   Allergic/Immunologic: Negative.   Neurological: Positive for light-headedness  (when changing positions too quickly) and numbness (neuropathy in legs). Negative for dizziness.  Hematological: Negative for adenopathy. Does not bruise/bleed easily.  Psychiatric/Behavioral: Positive for sleep disturbance (sleeping on 2-4 pillows). Negative for dysphoric mood. The patient is not nervous/anxious.    Vitals:   04/30/19 1449  BP: (!) 165/88  Pulse: 78  Resp: 18  SpO2: 100%  Weight: 214 lb (97.1 kg)  Height: 6\' 1"  (1.854 m)   Wt Readings from Last 3 Encounters:  04/30/19 214 lb (97.1 kg)  04/23/19 225 lb 8.5 oz (102.3 kg)  11/28/18 220 lb (99.8 kg)   Lab Results  Component Value Date   CREATININE 1.88 (H) 04/23/2019   CREATININE 2.03 (H) 04/22/2019   CREATININE 2.20 (H) 04/22/2019    Physical Exam Vitals and nursing note reviewed.  Constitutional:      Appearance: He is well-developed.  HENT:     Head: Normocephalic and atraumatic.  Neck:     Vascular: No JVD.  Cardiovascular:     Rate and Rhythm: Normal rate and regular rhythm.  Pulmonary:     Effort: Pulmonary effort is normal. No respiratory distress.     Breath sounds: No rhonchi or rales.  Abdominal:     Palpations: Abdomen is soft.     Tenderness: There is no abdominal tenderness.  Musculoskeletal:     Cervical back: Normal range of motion and neck supple.     Right lower leg: No tenderness. Edema (1+ pitting) present.     Left lower leg: No tenderness. Edema (1+ pitting) present.  Skin:    General: Skin is warm and dry.  Neurological:     General: No focal deficit present.     Mental Status: He is alert and oriented to person, place, and time.  Psychiatric:        Mood and Affect: Mood normal.        Behavior: Behavior normal.     Assessment & Plan:  1: Chronic heart failure with reduced ejection fraction- - NYHA class III - euvolemic today - weighing daily and says that his home weight is around 211 pounds; instructed to call for an overnight weight gain of >2 pounds or a weekly  weight gain of >5 pounds - not adding salt to his food and is trying to follow a low sodium diet - saw cardiology Minette Brine) 02/19/2019 - will stop his lisinopril and begin entresto 24/26mg  BID; he says his pill box is due to be filled today so it was written for him to not take anymore lisinopril and then begin entresto in 2 days; 30 day voucher given to patient - he is due to see cardiology on 05/07/19 so will send them a message asking to check BMP since he has a history of hyperkalemia - normally wears compression socks but they are in the washer at home - BNP 04/17/19 was 1618.0 - has received his flu vaccine for this season - says that he hasn't smoked cigarettes in the last couple of weeks - reports receiving his flu vaccine for this  season  2: HTN- - BP elevated but switching lisinopril to entresto per above - saw PCP Ouida Sills) 04/29/19 (yesterday) & returns 05/30/19 - BMP 04/29/19 reviewed and showed sodium 134, potassium 4.2, creatinine 1.5 and GFR 47  3: DM- - A1c 04/18/19 was 8.0% - nonfasting glucose in clinic today was 404 but patient says that he drank a regular soda today - patient says that his metformin is currently on hold  Patient did not bring his medications nor a list. Each medication was verbally reviewed with the patient and he was encouraged to bring the bottles to every visit to confirm accuracy of list.  Return in 3 weeks or sooner for any questions/problems before then.

## 2019-04-30 NOTE — Patient Instructions (Addendum)
Continue weighing daily and call for an overnight weight gain of > 2 pounds or a weekly weight gain of >5 pounds.  Do not take anymore lisinopril. You are to start entresto on Thursday and you will take 1 tablet twice daily with your other medications.

## 2019-05-19 NOTE — Progress Notes (Signed)
Patient ID: Shawn Hayes, male    DOB: [birth year], 64 y.o.   MRN: [id number]  HPI  Shawn Hayes is Hayes 64 y/o male with Hayes history of DM, anemia, HTN, CKD, COPD, current tobacco use and chronic heart failure.   Echo report from 04/18/19 reviewed and showed an EF of 30-35% along with trivial Shawn and moderately elevated PA pressure.  Admitted 04/17/19 due to acute on chronic heart failure with anasarca. Initially given IV lasix and then transitioned to oral diuretics. Spironolactone was held due to hyperkalemia. Given prednisone for COPD exacerbation. Discharged after 6 days.   He presents today for Hayes follow-up visit with Hayes chief complaint of minimal shortness of breath upon moderate exertion. He describes this as chronic in nature having been present for several years although he does say that his breathing has improved. He has associated fatigue, intermittent chest pain, pedal edema, diarrhea and difficulty sleeping along with this. He denies any abdominal distention, palpitations, dizziness, cough or weight gain.   Entresto started at last visit and tolerated without known side effects. Says that he's supposed to get medicare and medicaid effective 05/30/19.    Past Medical History:  Diagnosis Date  . Anemia   . BPH (benign prostatic hyperplasia)   . CHF (congestive heart failure) ([place])   . Chronic kidney disease   . Controlled type 2 diabetes mellitus without complication ([place])   . COPD (chronic obstructive pulmonary disease) ([place])   . Hypertension   . Idiopathic cardiomyopathy ([place])    Past Surgical History:  Procedure Laterality Date  . APPENDECTOMY    . TONSILLECTOMY     Family History  Problem Relation Age of Onset  . Cancer Brother    Social History   Tobacco Use  . Smoking status: Former Smoker    Types: Cigarettes  . Smokeless tobacco: Never Used  . Tobacco comment: Pt states he quit 1 week ago  Substance Use Topics  . Alcohol use: Not Currently   No Known  Allergies  Prior to Admission medications   Medication Sig Start Date End Date Taking? Authorizing Provider  albuterol (VENTOLIN HFA) 108 (90 Base) MCG/ACT inhaler Inhale 2 puffs into the lungs every 6 (six) hours as needed for wheezing or shortness of breath. 04/23/19  Yes Hayes, [name], Hayes  budesonide-formoterol [facility]) 160-4.5 MCG/ACT inhaler Inhale 2 puffs into the lungs in the morning and at bedtime. 04/23/19  Yes Shawn Hayes  carvedilol (COREG) 12.5 MG tablet Take 1 tablet (12.5 mg total) by mouth 2 (two) times daily with Hayes meal. 04/23/19  Yes Hayes, [name], Hayes  furosemide (LASIX) 40 MG tablet 2 tabs po every morning 04/23/19  Yes Hayes, [name], Hayes  glipiZIDE (GLUCOTROL) 5 MG tablet Take 1 tablet (5 mg total) by mouth 2 (two) times daily. 04/19/19 04/18/20 Yes Hayes, [name], Hayes  rOPINIRole (REQUIP) 0.5 MG tablet Take 1 tablet (0.5 mg total) by mouth at bedtime. 04/19/19  Yes Hayes, [name], Hayes  sacubitril-valsartan (ENTRESTO) 24-26 MG Take 1 tablet by mouth 2 (two) times daily. 04/30/19  Yes Shawn Hayes  tamsulosin (FLOMAX) 0.4 MG CAPS capsule Take 1 capsule (0.4 mg total) by mouth daily. 04/23/19  Yes Shawn Hayes  tiotropium (SPIRIVA) 18 MCG inhalation capsule Place 1 capsule (18 mcg total) into inhaler and inhale daily. Patient not taking: Reported on 05/20/2019 04/23/19   Shawn Hayes     Review of Systems  Constitutional: Positive for fatigue ( improving). Negative for appetite  change.  HENT: Negative for congestion, postnasal drip and sore throat.   Eyes: Negative.   Respiratory: Positive for shortness of breath (improving). Negative for cough.   Cardiovascular: Positive for chest pain (at times) and leg swelling. Negative for palpitations.  Gastrointestinal: Positive for diarrhea. Negative for abdominal distention and abdominal pain.  Endocrine: Negative.   Genitourinary: Negative.   Musculoskeletal: Negative for back pain and neck pain.   Skin: Negative.   Allergic/Immunologic: Negative.   Neurological: Positive for numbness (neuropathy in legs). Negative for dizziness and light-headedness.  Hematological: Negative for adenopathy. Does not bruise/bleed easily.  Psychiatric/Behavioral: Positive for sleep disturbance (sleeping on 1 pillow). Negative for dysphoric mood. The patient is not nervous/anxious.    Vitals:   05/20/19 1347 05/20/19 1348  BP: (!) 143/93 140/80  Pulse: 72   Resp: 18   SpO2: 99%   Weight: 214 lb (97.1 kg)   Height: 6' (1.829 m)    Wt Readings from Last 3 Encounters:  05/20/19 214 lb (97.1 kg)  04/30/19 214 lb (97.1 kg)  04/23/19 225 lb 8.5 oz (102.3 kg)   Lab Results  Component Value Date   CREATININE 1.88 (H) 04/23/2019   CREATININE 2.03 (H) 04/22/2019   CREATININE 2.20 (H) 04/22/2019     Physical Exam Vitals and nursing note reviewed.  Constitutional:      Appearance: He is well-developed.  HENT:     Head: Normocephalic and atraumatic.  Neck:     Vascular: No JVD.  Cardiovascular:     Rate and Rhythm: Normal rate and regular rhythm.  Pulmonary:     Effort: Pulmonary effort is normal. No respiratory distress.     Breath sounds: No rhonchi or rales.  Abdominal:     Palpations: Abdomen is soft.     Tenderness: There is no abdominal tenderness.  Musculoskeletal:     Cervical back: Normal range of motion and neck supple.     Right lower leg: No tenderness. Edema (1+ pitting) present.     Left lower leg: No tenderness. Edema (1+ pitting) present.  Skin:    General: Skin is warm and dry.  Neurological:     General: No focal deficit present.     Mental Status: He is alert and oriented to person, place, and time.  Psychiatric:        Mood and Affect: Mood normal.        Behavior: Behavior normal.     Assessment & Plan:  1: Chronic heart failure with reduced ejection fraction- - NYHA class II - euvolemic today - weighing daily; reminded to call for an overnight weight gain  of >2 pounds or Hayes weekly weight gain of >5 pounds - weight unchanged from last visit here 3 weeks ago - not adding salt to his food and is trying to follow Hayes low sodium diet - saw cardiology (Shawn Hayes) 05/07/19 - entresto 24/26mg  started at last visit - will check BMP today; would like to titrate up entresto but will wait until he gets his medicare/medicaid in Hayes couple of weeks - BNP 04/17/19 was 1618.0 - has received his flu vaccine for this season - walks his dog around his property every day and says that his property is ~ 1 acre in size  2: HTN- - BP initially elevated (143/93) and slightly better upon recheck (140/80) - saw PCP Ouida Sills) 04/29/19 (yesterday) & returns 05/30/19 - BMP 04/29/19 reviewed and showed sodium 134, potassium 4.2, creatinine 1.5 and GFR 47  3: DM- -  A1c 04/18/19 was 8.0% - nonfasting glucose at home today was 150 - continues with diarrhea even though metformin is on hold; may need GI referral after his insurance takes effect  4: Lymphedema- - stage 2 - not wearing compression socks as he says that his are worn through and he has to get some new ones; when he gets new ones, reminded to put them on every morning and remove them at bedtime - elevate his legs when sitting for long periods of time - does walk daily but edema persists - continue lymphapress compression boots if edema persists   Patient did not bring his medications nor Hayes list. Each medication was verbally reviewed with the patient and he was encouraged to bring the bottles to every visit to confirm accuracy of list.  Return in 3 weeks or sooner for any questions/problems before then.

## 2019-05-20 ENCOUNTER — Encounter: Payer: Self-pay | Admitting: Family

## 2019-05-20 ENCOUNTER — Ambulatory Visit: Payer: Self-pay | Attending: Family | Admitting: Family

## 2019-05-20 ENCOUNTER — Other Ambulatory Visit: Payer: Self-pay

## 2019-05-20 VITALS — BP 140/80 | HR 72 | Resp 18 | Ht 72.0 in | Wt 214.0 lb

## 2019-05-20 DIAGNOSIS — N189 Chronic kidney disease, unspecified: Secondary | ICD-10-CM | POA: Insufficient documentation

## 2019-05-20 DIAGNOSIS — Z87891 Personal history of nicotine dependence: Secondary | ICD-10-CM | POA: Insufficient documentation

## 2019-05-20 DIAGNOSIS — J449 Chronic obstructive pulmonary disease, unspecified: Secondary | ICD-10-CM | POA: Insufficient documentation

## 2019-05-20 DIAGNOSIS — Z79899 Other long term (current) drug therapy: Secondary | ICD-10-CM | POA: Insufficient documentation

## 2019-05-20 DIAGNOSIS — N400 Enlarged prostate without lower urinary tract symptoms: Secondary | ICD-10-CM | POA: Insufficient documentation

## 2019-05-20 DIAGNOSIS — I890 Lymphedema, not elsewhere classified: Secondary | ICD-10-CM | POA: Insufficient documentation

## 2019-05-20 DIAGNOSIS — I429 Cardiomyopathy, unspecified: Secondary | ICD-10-CM | POA: Insufficient documentation

## 2019-05-20 DIAGNOSIS — Z7951 Long term (current) use of inhaled steroids: Secondary | ICD-10-CM | POA: Insufficient documentation

## 2019-05-20 DIAGNOSIS — I5022 Chronic systolic (congestive) heart failure: Secondary | ICD-10-CM | POA: Insufficient documentation

## 2019-05-20 DIAGNOSIS — I10 Essential (primary) hypertension: Secondary | ICD-10-CM

## 2019-05-20 DIAGNOSIS — I130 Hypertensive heart and chronic kidney disease with heart failure and stage 1 through stage 4 chronic kidney disease, or unspecified chronic kidney disease: Secondary | ICD-10-CM | POA: Insufficient documentation

## 2019-05-20 DIAGNOSIS — Z7984 Long term (current) use of oral hypoglycemic drugs: Secondary | ICD-10-CM | POA: Insufficient documentation

## 2019-05-20 DIAGNOSIS — E1122 Type 2 diabetes mellitus with diabetic chronic kidney disease: Secondary | ICD-10-CM | POA: Insufficient documentation

## 2019-05-20 LAB — BASIC METABOLIC PANEL
Anion gap: 7 (ref 5–15)
BUN: 22 mg/dL (ref 8–23)
CO2: 23 mmol/L (ref 22–32)
Calcium: 8.3 mg/dL — ABNORMAL LOW (ref 8.9–10.3)
Chloride: 108 mmol/L (ref 98–111)
Creatinine, Ser: 1.54 mg/dL — ABNORMAL HIGH (ref 0.61–1.24)
GFR calc Af Amer: 54 mL/min — ABNORMAL LOW (ref >60)
GFR calc non Af Amer: 47 mL/min — ABNORMAL LOW (ref >60)
Glucose, Bld: 161 mg/dL — ABNORMAL HIGH (ref 70–99)
Potassium: 3.9 mmol/L (ref 3.5–5.1)
Sodium: 138 mmol/L (ref 135–145)

## 2019-05-20 NOTE — Patient Instructions (Signed)
Continue weighing daily and call for an overnight weight gain of > 2 pounds or a weekly weight gain of >5 pounds. 

## 2019-05-30 ENCOUNTER — Telehealth: Payer: Self-pay | Admitting: Pharmacy Technician

## 2019-05-30 DIAGNOSIS — Z87891 Personal history of nicotine dependence: Secondary | ICD-10-CM | POA: Diagnosis not present

## 2019-05-30 DIAGNOSIS — E1122 Type 2 diabetes mellitus with diabetic chronic kidney disease: Secondary | ICD-10-CM | POA: Diagnosis not present

## 2019-05-30 DIAGNOSIS — J42 Unspecified chronic bronchitis: Secondary | ICD-10-CM | POA: Diagnosis not present

## 2019-05-30 DIAGNOSIS — I429 Cardiomyopathy, unspecified: Secondary | ICD-10-CM | POA: Diagnosis not present

## 2019-05-30 DIAGNOSIS — M47816 Spondylosis without myelopathy or radiculopathy, lumbar region: Secondary | ICD-10-CM | POA: Diagnosis not present

## 2019-05-30 DIAGNOSIS — N183 Chronic kidney disease, stage 3 unspecified: Secondary | ICD-10-CM | POA: Diagnosis not present

## 2019-05-30 DIAGNOSIS — R29898 Other symptoms and signs involving the musculoskeletal system: Secondary | ICD-10-CM | POA: Diagnosis not present

## 2019-05-30 NOTE — Telephone Encounter (Signed)
Patient indicated that he has Medicare effective 05/30/19.  Also, stated that he qualified for the Medicare Savings Program and L.I.S.  Has been working with an Medical illustrator to assist with signing-up for a Medicare Part D plan.  Explained to patient that since he now has Medicare, [facility] would no longer be able to provide medication assistance.  Patient acknowledged that he understood.  [facility]

## 2019-05-30 NOTE — Telephone Encounter (Signed)
  [place], [place]  [address]     This is to inform you that you are no longer eligible to receive medication assistance at [facility].  The reason(s) are:    _____Your total gross monthly household income exceeds 250% of the Federal Poverty Level.   _____Tangible assets (savings, checking, stocks/bonds, pension, retirement, etc.) exceeds our limit  _____You are eligible to receive benefits from [facility], [facility] or HIV Medication            Assistance program __X__You are eligible to receive benefits from a Medicare Part "D" plan _____You have prescription insurance  _____You are not an [facility] resident _____Failure to provide all requested proof of income information for 2021.    We regret that we are unable to help you at this time.  If your prescription coverage is terminated, please contact [facility], so that we may reassess your eligibility for our program.  If you have questions, we may be contacted at [phone].  Thank you,  [facility]

## 2019-06-03 NOTE — Progress Notes (Signed)
Patient ID: Shawn Hayes, male    DOB: Feb 14, 1956, 64 y.o.   MRN: [id number]  HPI  Shawn Hayes is a 64 y/o male with a history of DM, anemia, HTN, CKD, COPD, current tobacco use and chronic heart failure.   Echo report from 04/18/19 reviewed and showed an EF of 30-35% along with trivial Shawn and moderately elevated PA pressure.  Admitted 04/17/19 due to acute on chronic heart failure with anasarca. Initially given IV lasix and then transitioned to oral diuretics. Spironolactone was held due to hyperkalemia. Given prednisone for COPD exacerbation. Discharged after 6 days.   He presents today for a follow-up visit with a chief complaint of moderate shortness of breath with minimal exertion. He describes this as chronic in nature although he does notice that it's a little bit worse over these last couple of weeks. He has associated fatigue, intermittent chest pain, edema (worsening), difficulty sleeping and weight gain along with this. He denies any dizziness, abdominal distention, palpitations or cough.   Doesn't know his medications as he says that his sister fixes his pillbox for him but that she doesn't know what he's taking either as she just takes them out of the bottles.    Past Medical History:  Diagnosis Date  . Anemia   . BPH (benign prostatic hyperplasia)   . CHF (congestive heart failure) ([place])   . Chronic kidney disease   . Controlled type 2 diabetes mellitus without complication ([place])   . COPD (chronic obstructive pulmonary disease) ([place])   . Hypertension   . Idiopathic cardiomyopathy ([place])    Past Surgical History:  Procedure Laterality Date  . APPENDECTOMY    . TONSILLECTOMY     Family History  Problem Relation Age of Onset  . Cancer Brother    Social History   Tobacco Use  . Smoking status: Former Smoker    Types: Cigarettes  . Smokeless tobacco: Never Used  . Tobacco comment: Pt states he quit 1 week ago  Substance Use Topics  . Alcohol use: Not Currently    No Known Allergies  Prior to Admission medications   Medication Sig Start Date End Date Taking? Authorizing Provider  albuterol (VENTOLIN HFA) 108 (90 Base) MCG/ACT inhaler Inhale 2 puffs into the lungs every 6 (six) hours as needed for wheezing or shortness of breath. 04/23/19  Yes [name], [name], MD  budesonide-formoterol [facility]) 160-4.5 MCG/ACT inhaler Inhale 2 puffs into the lungs in the morning and at bedtime. 04/23/19  Yes [name], MD  carvedilol (COREG) 12.5 MG tablet Take 1 tablet (12.5 mg total) by mouth 2 (two) times daily with a meal. 04/23/19  Yes [name], [name], MD  furosemide (LASIX) 40 MG tablet 2 tabs po every morning 04/23/19  Yes [name], [name], MD  glipiZIDE (GLUCOTROL) 5 MG tablet Take 1 tablet (5 mg total) by mouth 2 (two) times daily. 04/19/19 04/18/20 Yes [name], [name], MD  metFORMIN (GLUCOPHAGE-XR) 500 MG 24 hr tablet Take 500 mg by mouth daily with breakfast. Take 2 tablets in the morning and at night.   Yes [provider]  Multiple Vitamin (MULTIVITAMIN WITH MINERALS) TABS tablet Take 1 tablet by mouth daily.   Yes [provider]  omega-3 acid ethyl esters (LOVAZA) 1 g capsule Take 1 g by mouth 2 (two) times daily.   Yes [provider]  rOPINIRole (REQUIP) 0.5 MG tablet Take 1 tablet (0.5 mg total) by mouth at bedtime. 04/19/19  Yes [name], MD  sacubitril-valsartan [facility]) 24-26  MG Take 1 tablet by mouth 2 (two) times daily. 04/30/19  Yes Darylene Price A, FNP  tiotropium (SPIRIVA) 18 MCG inhalation capsule Place 1 capsule (18 mcg total) into inhaler and inhale daily. 04/23/19  Yes [name], [name], MD  tamsulosin (FLOMAX) 0.4 MG CAPS capsule Take 1 capsule (0.4 mg total) by mouth daily. 04/23/19   [name], MD    Review of Systems  Constitutional: Positive for fatigue. Negative for appetite change.  HENT: Negative for congestion, postnasal drip and sore throat.   Eyes: Negative.   Respiratory: Positive  for shortness of breath ("little worse"). Negative for cough.   Cardiovascular: Positive for chest pain (at times) and leg swelling. Negative for palpitations.  Gastrointestinal: Positive for diarrhea. Negative for abdominal distention and abdominal pain.  Endocrine: Negative.   Genitourinary: Negative.   Musculoskeletal: Negative for back pain and neck pain.  Skin: Negative.   Allergic/Immunologic: Negative.   Neurological: Positive for numbness (neuropathy in legs). Negative for dizziness and light-headedness.  Hematological: Negative for adenopathy. Does not bruise/bleed easily.  Psychiatric/Behavioral: Positive for sleep disturbance (sleeping on 1 pillow). Negative for dysphoric mood. The patient is not nervous/anxious.    Vitals:   06/04/19 0924  BP: (!) 159/104  Pulse: 73  Resp: 18  SpO2: 100%  Weight: 223 lb 6 oz (101.3 kg)  Height: 6\' 1"  (1.854 m)   Wt Readings from Last 3 Encounters:  06/04/19 223 lb 6 oz (101.3 kg)  05/20/19 214 lb (97.1 kg)  04/30/19 214 lb (97.1 kg)   Lab Results  Component Value Date   CREATININE 1.54 (H) 05/20/2019   CREATININE 1.88 (H) 04/23/2019   CREATININE 2.03 (H) 04/22/2019    Physical Exam Vitals and nursing note reviewed.  Constitutional:      Appearance: He is well-developed.  HENT:     Head: Normocephalic and atraumatic.  Neck:     Vascular: No JVD.  Cardiovascular:     Rate and Rhythm: Normal rate and regular rhythm.  Pulmonary:     Effort: Pulmonary effort is normal. No respiratory distress.     Breath sounds: No rhonchi or rales.  Abdominal:     Palpations: Abdomen is soft.     Tenderness: There is no abdominal tenderness.  Musculoskeletal:     Cervical back: Normal range of motion and neck supple.     Right lower leg: No tenderness. Edema (2+ pitting) present.     Left lower leg: No tenderness. Edema (2+ pitting) present.  Skin:    General: Skin is warm and dry.  Neurological:     General: No focal deficit present.      Mental Status: He is alert and oriented to person, place, and time.  Psychiatric:        Mood and Affect: Mood normal.        Behavior: Behavior normal.     Assessment & Plan:  1: Chronic heart failure with reduced ejection fraction- - NYHA class III - euvolemic today - weighing daily; reminded to call for an overnight weight gain of >2 pounds or a weekly weight gain of >5 pounds - weight up 9 pounds from last visit here 2 weeks ago - not adding salt to his food and is trying to follow a low sodium diet - saw cardiology (Fath) 05/07/19 - would like to titrate up entresto but will wait until he gets his medicare/medicaid in a couple of weeks - will increase his diuretic to furosemide 40mg  BID (currently taking 40mg   daily) - will check BMP at next visit - BNP 04/17/19 was 1618.0 - PharmD reconciled medications w/ the patient  2: HTN- - BP elevated although he hasn't taken any of his medications yet today; says that he'll go home and take them; increasing furosemide per above - saw PCP Ouida Sills) 05/30/19 - BMP 05/30/19 reviewed and showed sodium 139, potassium 4.0, creatinine 1.6 and GFR 44  3: DM- - A1c 05/30/19 was 8.4% - nonfasting glucose at home today was 114  4: Lymphedema- - stage 2 - has worn compression socks in the past but says that the swelling continues - elevate his legs when sitting for long periods of time - does walk daily but edema persists - will make referral for lymphapress compression boots and patient is agreeable to this   Patient did not bring his medications nor a list and he admits that he has no idea what he's taking. Emphasized the importance of bringing his bottles to every visit every time as our list and PCP list doesn't completely match.  Return in 2 weeks or sooner for any questions/problems before then.

## 2019-06-04 ENCOUNTER — Other Ambulatory Visit: Payer: Self-pay

## 2019-06-04 ENCOUNTER — Ambulatory Visit: Payer: Medicare HMO | Attending: Family | Admitting: Family

## 2019-06-04 ENCOUNTER — Encounter: Payer: Self-pay | Admitting: Family

## 2019-06-04 VITALS — BP 159/104 | HR 73 | Resp 18 | Ht 73.0 in | Wt 223.4 lb

## 2019-06-04 DIAGNOSIS — Z7901 Long term (current) use of anticoagulants: Secondary | ICD-10-CM | POA: Insufficient documentation

## 2019-06-04 DIAGNOSIS — I5022 Chronic systolic (congestive) heart failure: Secondary | ICD-10-CM | POA: Insufficient documentation

## 2019-06-04 DIAGNOSIS — R0602 Shortness of breath: Secondary | ICD-10-CM | POA: Diagnosis not present

## 2019-06-04 DIAGNOSIS — Z7984 Long term (current) use of oral hypoglycemic drugs: Secondary | ICD-10-CM | POA: Insufficient documentation

## 2019-06-04 DIAGNOSIS — I130 Hypertensive heart and chronic kidney disease with heart failure and stage 1 through stage 4 chronic kidney disease, or unspecified chronic kidney disease: Secondary | ICD-10-CM | POA: Insufficient documentation

## 2019-06-04 DIAGNOSIS — N183 Chronic kidney disease, stage 3 unspecified: Secondary | ICD-10-CM

## 2019-06-04 DIAGNOSIS — I890 Lymphedema, not elsewhere classified: Secondary | ICD-10-CM | POA: Insufficient documentation

## 2019-06-04 DIAGNOSIS — E1122 Type 2 diabetes mellitus with diabetic chronic kidney disease: Secondary | ICD-10-CM | POA: Insufficient documentation

## 2019-06-04 DIAGNOSIS — N189 Chronic kidney disease, unspecified: Secondary | ICD-10-CM | POA: Diagnosis not present

## 2019-06-04 DIAGNOSIS — I10 Essential (primary) hypertension: Secondary | ICD-10-CM

## 2019-06-04 DIAGNOSIS — Z79899 Other long term (current) drug therapy: Secondary | ICD-10-CM | POA: Insufficient documentation

## 2019-06-04 DIAGNOSIS — Z87891 Personal history of nicotine dependence: Secondary | ICD-10-CM | POA: Diagnosis not present

## 2019-06-04 DIAGNOSIS — E875 Hyperkalemia: Secondary | ICD-10-CM | POA: Diagnosis not present

## 2019-06-04 DIAGNOSIS — D649 Anemia, unspecified: Secondary | ICD-10-CM | POA: Diagnosis not present

## 2019-06-04 DIAGNOSIS — J441 Chronic obstructive pulmonary disease with (acute) exacerbation: Secondary | ICD-10-CM | POA: Diagnosis not present

## 2019-06-04 DIAGNOSIS — I429 Cardiomyopathy, unspecified: Secondary | ICD-10-CM | POA: Diagnosis not present

## 2019-06-04 LAB — GLUCOSE, CAPILLARY: Glucose-Capillary: 118 mg/dL — ABNORMAL HIGH (ref 70–99)

## 2019-06-04 MED ORDER — FUROSEMIDE 40 MG PO TABS: 40.0000 mg | ORAL_TABLET | Freq: Two times a day (BID) | ORAL | 5 refills | Status: DC

## 2019-06-04 NOTE — Patient Instructions (Addendum)
Continue weighing daily and call for an overnight weight gain of > 2 pounds or a weekly weight gain of >5 pounds.    Increase your furosemide (lasix) to 40mg  twice daily.

## 2019-06-04 NOTE — Progress Notes (Signed)
[facility] - PHARMACIST COUNSELING NOTE  ADHERENCE ASSESSMENT  Adherence strategy: sister fills pill box.    Do you ever forget to take your medication? [] Yes (1) [x] No (0)  Do you ever skip doses due to side effects? [] Yes (1) [x] No (0)  Do you have trouble affording your medicines? [] Yes (1) [x] No (0)  Are you ever unable to pick up your medication due to transportation difficulties? [] Yes (1) [x] No (0)  Do you ever stop taking your medications because you don't believe they are helping? [] Yes (1) [x] No (0)  Total score _0______     Guideline-Directed Medical Therapy/Evidence Based Medicine Patient did not bring medications to clinic visit. Patient was not sure what dose of coreg or entresto he is taking.     ACE/ARB/ARNI: Delene Loll 24-26 mg BID (Dr. Tonette Bihari note pt should be on 49-51 mg)    Beta Blocker: Coreg 12.5 mg BID (Dr. Tonette Bihari note pt should be on 25 mg BID)    Aldosterone Antagonist: None Diuretic: Lasix 40 mg daily    SUBJECTIVE   HPI: Follow up clinic visit. Patient has compliant of fluid retention, and diarrhea with restarting metformin.   Past Medical History:  Diagnosis Date   Anemia    BPH (benign prostatic hyperplasia)    CHF (congestive heart failure) (HCC)    Chronic kidney disease    Controlled type 2 diabetes mellitus without complication (HCC)    COPD (chronic obstructive pulmonary disease) (HCC)    Hypertension    Idiopathic cardiomyopathy (HCC)         OBJECTIVE    Vital signs: HR 73, BP 143/93, weight (pounds) 223  ECHO: Date 04/18/2019, EF 30-35  BMP Latest Ref Rng & Units 05/20/2019 04/23/2019 04/22/2019  Glucose 70 - 99 mg/dL 161(H) 309(H) 258(H)  BUN 8 - 23 mg/dL 22 50(H) 49(H)  Creatinine 0.61 - 1.24 mg/dL 1.54(H) 1.88(H) 2.03(H)  Sodium 135 - 145 mmol/L 138 134(L) 133(L)  Potassium 3.5 - 5.1 mmol/L 3.9 4.4 5.1  Chloride 98 - 111 mmol/L 108 100 101  CO2 22 - 32 mmol/L 23 27 23    Calcium 8.9 - 10.3 mg/dL 8.3(L) 8.7(L) 8.4(L)    ASSESSMENT Pt gained 10 pounds from last weight and has some fluid retention. Blood pressure is elevated and patient states it has gotten as high as 160.   PLAN Will need to figure what exactly the patient is taking at home. May need to adjust his entresto to 49/51 if it has not already been done. Recommend increasing lasix doe to 60-80 mg daily to help pull off fluid. Creatinine seems to be trending down.   Recommend that the patient take food with the metformin to reduce diarrhea. Inform the patient that metformin is a really good medication and he should try to power through the side effects for the month or so.   Time spent: 10 minutes  Oswald Hillock, Pharm.D, BCPS Clinical Pharmacist 06/04/2019 9:50 AM    Current Outpatient Medications:    albuterol (VENTOLIN HFA) 108 (90 Base) MCG/ACT inhaler, Inhale 2 puffs into the lungs every 6 (six) hours as needed for wheezing or shortness of breath., Disp: 18 g, Rfl: 0   budesonide-formoterol (SYMBICORT) 160-4.5 MCG/ACT inhaler, Inhale 2 puffs into the lungs in the morning and at bedtime., Disp: 1 Inhaler, Rfl: 0   carvedilol (COREG) 12.5 MG tablet, Take 1 tablet (12.5 mg total) by mouth 2 (two) times daily with a meal., Disp: 60 tablet,  Rfl: 0   furosemide (LASIX) 40 MG tablet, 2 tabs po every morning, Disp: 60 tablet, Rfl: 0   glipiZIDE (GLUCOTROL) 5 MG tablet, Take 1 tablet (5 mg total) by mouth 2 (two) times daily., Disp: 60 tablet, Rfl: 11   metFORMIN (GLUCOPHAGE-XR) 500 MG 24 hr tablet, Take 500 mg by mouth daily with breakfast. Take 2 tablets in the morning and at night., Disp: , Rfl:    Multiple Vitamin (MULTIVITAMIN WITH MINERALS) TABS tablet, Take 1 tablet by mouth daily., Disp: , Rfl:    omega-3 acid ethyl esters (LOVAZA) 1 g capsule, Take 1 g by mouth 2 (two) times daily., Disp: , Rfl:    rOPINIRole (REQUIP) 0.5 MG tablet, Take 1 tablet (0.5 mg total) by mouth at bedtime., Disp: 30  tablet, Rfl: 0   sacubitril-valsartan (ENTRESTO) 24-26 MG, Take 1 tablet by mouth 2 (two) times daily., Disp: 60 tablet, Rfl: 3   tamsulosin (FLOMAX) 0.4 MG CAPS capsule, Take 1 capsule (0.4 mg total) by mouth daily., Disp: 30 capsule, Rfl: 0   tiotropium (SPIRIVA) 18 MCG inhalation capsule, Place 1 capsule (18 mcg total) into inhaler and inhale daily., Disp: 30 capsule, Rfl: 12   COUNSELING POINTS/CLINICAL PEARLS  Carvedilol (Goal: weight less than 85 kg is 25 mg BID, weight greater than 85 kg is 50 mg BID)  Patient should avoid activities requiring coordination until drug effects are realized, as drug may cause dizziness.  This drug may cause diarrhea, nausea, vomiting, arthralgia, back pain, myalgia, headache, vision disorder, erectile dysfunction, reduced libido, or fatigue.  Instruct patient to report signs/symptoms of adverse cardiovascular effects such as hypotension (especially in elderly patients), arrhythmias, syncope, palpitations, angina, or edema.  Drug may mask symptoms of hypoglycemia. Advise diabetic patients to carefully monitor blood sugar levels.  Patient should take drug with food.  Advise patient against sudden discontinuation of drug. Entresto (Goal: 97/103 mg twice daily)  Warn male patient to avoid pregnancy during therapy and to report a pregnancy to a physician.  Advise patient to report symptomatic hypotension.  Side effects may include hyperkalemia, cough, dizziness, or renal failure. Furosemide  Drug causes sun-sensitivity. Advise patient to use sunscreen and avoid tanning beds. Patient should avoid activities requiring coordination until drug effects are realized, as drug may cause dizziness, vertigo, or blurred vision. This drug may cause hyperglycemia, hyperuricemia, constipation, diarrhea, loss of appetite, nausea, vomiting, purpuric disorder, cramps, spasticity, asthenia, headache, paresthesia, or scaling eczema. Instruct patient to report unusual  bleeding/bruising or signs/symptoms of hypotension, infection, pancreatitis, or ototoxicity (tinnitus, hearing impairment). Advise patient to report signs/symptoms of a severe skin reactions (flu-like symptoms, spreading red rash, or skin/mucous membrane blistering) or erythema multiforme. Instruct patient to eat high-potassium foods during drug therapy, as directed by healthcare professional.  Patient should not drink alcohol while taking this drug.   DRUGS TO AVOID IN HEART FAILURE  Drug or Class Mechanism  Analgesics NSAIDs COX-2 inhibitors Glucocorticoids  Sodium and water retention, increased systemic vascular resistance, decreased response to diuretics   Diabetes Medications Metformin Thiazolidinediones Rosiglitazone (Avandia) Pioglitazone (Actos) DPP4 Inhibitors Saxagliptin (Onglyza) Sitagliptin (Januvia)   Lactic acidosis Possible calcium channel blockade   Unknown  Antiarrhythmics Class I  Flecainide Disopyramide Class III Sotalol Other Dronedarone  Negative inotrope, proarrhythmic   Proarrhythmic, beta blockade  Negative inotrope  Antihypertensives Alpha Blockers Doxazosin Calcium Channel Blockers Diltiazem Verapamil Nifedipine Central Alpha Adrenergics Moxonidine Peripheral Vasodilators Minoxidil  Increases renin and aldosterone  Negative inotrope    Possible sympathetic withdrawal  Unknown  Anti-infective Itraconazole Amphotericin B  Negative inotrope Unknown  Hematologic Anagrelide Cilostazol   Possible inhibition of PD IV Inhibition of PD III causing arrhythmias  Neurologic/Psychiatric Stimulants Anti-Seizure Drugs Carbamazepine Pregabalin Antidepressants Tricyclics Citalopram Parkinsons Bromocriptine Pergolide Pramipexole Antipsychotics Clozapine Antimigraine Ergotamine Methysergide Appetite suppressants Bipolar Lithium  Peripheral alpha and beta agonist activity  Negative inotrope and chronotrope Calcium  channel blockade  Negative inotrope, proarrhythmic Dose-dependent QT prolongation  Excessive serotonin activity/valvular damage Excessive serotonin activity/valvular damage Unknown  IgE mediated hypersensitivy, calcium channel blockade  Excessive serotonin activity/valvular damage Excessive serotonin activity/valvular damage Valvular damage  Direct myofibrillar degeneration, adrenergic stimulation  Antimalarials Chloroquine Hydroxychloroquine Intracellular inhibition of lysosomal enzymes  Urologic Agents Alpha Blockers Doxazosin Prazosin Tamsulosin Terazosin  Increased renin and aldosterone  Adapted from Page Carleene Overlie, et al. "Drugs That May Cause or Exacerbate Heart Failure: A Scientific Statement from the American Heart  Association." Circulation 2016; 134:e32-e69. DOI: 10.1161/CIR.0000000000000426   MEDICATION ADHERENCES TIPS AND STRATEGIES Taking medication as prescribed improves patient outcomes in heart failure (reduces hospitalizations, improves symptoms, increases survival) Side effects of medications can be managed by decreasing doses, switching agents, stopping drugs, or adding additional therapy. Please let someone in the [facility] know if you have having bothersome side effects so we can modify your regimen. Do not alter your medication regimen without talking to [place].  Medication reminders can help patients remember to take drugs on time. If you are missing or forgetting doses you can try linking behaviors, using pill boxes, or an electronic reminder like an alarm on your phone or an app. Some people can also get automated phone calls as medication reminders.

## 2019-06-07 DIAGNOSIS — H4312 Vitreous hemorrhage, left eye: Secondary | ICD-10-CM | POA: Diagnosis not present

## 2019-06-07 DIAGNOSIS — E113592 Type 2 diabetes mellitus with proliferative diabetic retinopathy without macular edema, left eye: Secondary | ICD-10-CM | POA: Diagnosis not present

## 2019-06-11 DIAGNOSIS — E113413 Type 2 diabetes mellitus with severe nonproliferative diabetic retinopathy with macular edema, bilateral: Secondary | ICD-10-CM | POA: Diagnosis not present

## 2019-06-11 DIAGNOSIS — H4312 Vitreous hemorrhage, left eye: Secondary | ICD-10-CM | POA: Diagnosis not present

## 2019-06-18 ENCOUNTER — Ambulatory Visit: Payer: Self-pay | Admitting: Family

## 2019-06-24 NOTE — Progress Notes (Signed)
Patient ID: Shawn Hayes, male    DOB: Aug 05, 1955, 64 y.o.   MRN: [id number]  HPI  Mr Sgro is a 64 y/o male with a history of DM, anemia, HTN, CKD, COPD, current tobacco use and chronic heart failure.   Echo report from 04/18/19 reviewed and showed an EF of 30-35% along with trivial MR and moderately elevated PA pressure.  Admitted 04/17/19 due to acute on chronic heart failure with anasarca. Initially given IV lasix and then transitioned to oral diuretics. Spironolactone was held due to hyperkalemia. Given prednisone for COPD exacerbation. Discharged after 6 days.   He presents today for a follow-up visit with a chief complaint of moderate shortness of breath upon minimal exertion. He describes this as chronic in nature having been present for several years although he does feel like it's worsening over the last couple of weeks. He has associated fatigue, cough, intermittent chest pain, worsening pedal edema, difficulty sleeping and weight gain along with this. He denies any abdominal distention, palpitations or dizziness.   Says that he hasn't missed any medications and his sister is now cooking for him and she doesn't cook with salt. Will have medicaid effective 06/29/19   Past Medical History:  Diagnosis Date  . Anemia   . BPH (benign prostatic hyperplasia)   . CHF (congestive heart failure) ([place])   . Chronic kidney disease   . Controlled type 2 diabetes mellitus without complication ([place])   . COPD (chronic obstructive pulmonary disease) ([place])   . Hypertension   . Idiopathic cardiomyopathy ([place])    Past Surgical History:  Procedure Laterality Date  . APPENDECTOMY    . TONSILLECTOMY     Family History  Problem Relation Age of Onset  . Cancer Brother    Social History   Tobacco Use  . Smoking status: Former Smoker    Types: Cigarettes  . Smokeless tobacco: Never Used  . Tobacco comment: Pt states he quit 1 week ago  Substance Use Topics  . Alcohol use: Not Currently    No Known Allergies  Prior to Admission medications   Medication Sig Start Date End Date Taking? Authorizing Provider  albuterol (VENTOLIN HFA) 108 (90 Base) MCG/ACT inhaler Inhale 2 puffs into the lungs every 6 (six) hours as needed for wheezing or shortness of breath. 04/23/19  Yes [name], [name], MD  budesonide-formoterol [facility]) 160-4.5 MCG/ACT inhaler Inhale 2 puffs into the lungs in the morning and at bedtime. 04/23/19  Yes [name], MD  carvedilol (COREG) 12.5 MG tablet Take 1 tablet (12.5 mg total) by mouth 2 (two) times daily with a meal. 04/23/19  Yes [name], [name], MD  furosemide (LASIX) 40 MG tablet Take 1 tablet (40 mg total) by mouth 2 (two) times daily. 06/04/19  Yes Kimaya Whitlatch A, FNP  glipiZIDE (GLUCOTROL) 5 MG tablet Take 1 tablet (5 mg total) by mouth 2 (two) times daily. 04/19/19 04/18/20 Yes [name], [name], MD  metFORMIN (GLUCOPHAGE-XR) 500 MG 24 hr tablet Take 500 mg by mouth daily with breakfast. Take 2 tablets in the morning and at night.   Yes [provider]  Multiple Vitamin (MULTIVITAMIN WITH MINERALS) TABS tablet Take 1 tablet by mouth daily.   Yes [provider]  omega-3 acid ethyl esters (LOVAZA) 1 g capsule Take 1 g by mouth 2 (two) times daily.   Yes [provider]  rOPINIRole (REQUIP) 0.5 MG tablet Take 1 tablet (0.5 mg total) by mouth at bedtime. 04/19/19  Yes [name], MD  sacubitril-valsartan (ENTRESTO) 24-26 MG Take 1 tablet by mouth 2 (two) times daily. 04/30/19  Yes Darylene Price A, FNP  tamsulosin (FLOMAX) 0.4 MG CAPS capsule Take 1 capsule (0.4 mg total) by mouth daily. 04/23/19  Yes [name], MD  tiotropium (SPIRIVA) 18 MCG inhalation capsule Place 1 capsule (18 mcg total) into inhaler and inhale daily. 04/23/19  Yes [name], MD     Review of Systems  Constitutional: Positive for fatigue. Negative for appetite change.  HENT: Negative for congestion, postnasal drip and sore throat.    Eyes: Negative.   Respiratory: Positive for cough and shortness of breath ("little worse").   Cardiovascular: Positive for chest pain (at times) and leg swelling (worsening). Negative for palpitations.  Gastrointestinal: Positive for diarrhea. Negative for abdominal distention and abdominal pain.  Endocrine: Negative.   Genitourinary: Negative.   Musculoskeletal: Negative for back pain and neck pain.  Skin: Negative.   Allergic/Immunologic: Negative.   Neurological: Positive for numbness (neuropathy in legs). Negative for dizziness and light-headedness.  Hematological: Negative for adenopathy. Does not bruise/bleed easily.  Psychiatric/Behavioral: Positive for sleep disturbance (sleeping on 1 pillow). Negative for dysphoric mood. The patient is not nervous/anxious.    Vitals:   06/25/19 1303  BP: (!) 164/105  Pulse: 79  Resp: 18  SpO2: 98%  Weight: 236 lb 6 oz (107.2 kg)  Height: 6\' 1"  (1.854 m)   Wt Readings from Last 3 Encounters:  06/25/19 236 lb 6 oz (107.2 kg)  06/04/19 223 lb 6 oz (101.3 kg)  05/20/19 214 lb (97.1 kg)   Lab Results  Component Value Date   CREATININE 1.54 (H) 05/20/2019   CREATININE 1.88 (H) 04/23/2019   CREATININE 2.03 (H) 04/22/2019     Physical Exam Vitals and nursing note reviewed.  Constitutional:      Appearance: He is well-developed.  HENT:     Head: Normocephalic and atraumatic.  Neck:     Vascular: No JVD.  Cardiovascular:     Rate and Rhythm: Normal rate and regular rhythm.  Pulmonary:     Effort: Pulmonary effort is normal. No respiratory distress.     Breath sounds: No rhonchi or rales.  Abdominal:     General: There is distension.     Palpations: Abdomen is soft.     Tenderness: There is no abdominal tenderness.  Musculoskeletal:     Cervical back: Normal range of motion and neck supple.     Right lower leg: No tenderness. Edema (3+ pitting) present.     Left lower leg: No tenderness. Edema (3+ pitting) present.  Skin:     General: Skin is warm and dry.  Neurological:     General: No focal deficit present.     Mental Status: He is alert and oriented to person, place, and time.  Psychiatric:        Mood and Affect: Mood normal.        Behavior: Behavior normal.     Assessment & Plan:  1: Acute on Chronic heart failure with reduced ejection fraction- - NYHA class III - moderately fluid overloaded today - weighing daily; reminded to call for an overnight weight gain of >2 pounds or a weekly weight gain of >5 pounds - weight up 13 pounds from last visit here 3 weeks ago - will send for 80mg  IV lasix/ 51meq PO potassium today - BNP/BMP to be drawn today as well - not adding salt to his food and is trying to follow a low sodium  diet; sister is now cooking for him and doesn't cook with salt - 2 weeks entresto samples 24/26mg  given to patient; will have medicaid effective 06/29/19 - saw cardiology (Fath) 05/07/19 - BNP 04/17/19 was 1618.0  2: HTN- - BP elevated today but sending for IV lasix today - saw PCP Ouida Sills) 05/30/19 - BMP 05/30/19 reviewed and showed sodium 139, potassium 4.0, creatinine 1.6 and GFR 44  3: DM- - A1c 05/30/19 was 8.4% - fasting glucose at home today was 93  4: Lymphedema- - stage 2 - has worn compression socks in the past but says that the swelling continues - elevate his legs when sitting for long periods of time - does walk daily but edema persists - has heard from lymphapress compression [place] but is waiting to hear what his insurance will pay   Patient did not bring his medications nor a list and he admits that he has no idea what he's taking.   Return tomorrow.

## 2019-06-25 ENCOUNTER — Other Ambulatory Visit: Payer: Self-pay

## 2019-06-25 ENCOUNTER — Ambulatory Visit
Admission: RE | Admit: 2019-06-25 | Discharge: 2019-06-25 | Disposition: A | Discharge status: Home / Self Care | Payer: Medicare HMO | Source: Ambulatory Visit | Attending: Family | Admitting: Family

## 2019-06-25 ENCOUNTER — Other Ambulatory Visit: Payer: Self-pay | Admitting: Family

## 2019-06-25 ENCOUNTER — Encounter: Payer: Self-pay | Admitting: Family

## 2019-06-25 ENCOUNTER — Ambulatory Visit: Payer: Medicare HMO | Admitting: Family

## 2019-06-25 VITALS — BP 164/105 | HR 79 | Resp 18 | Ht 73.0 in | Wt 236.4 lb

## 2019-06-25 DIAGNOSIS — E1122 Type 2 diabetes mellitus with diabetic chronic kidney disease: Secondary | ICD-10-CM | POA: Insufficient documentation

## 2019-06-25 DIAGNOSIS — R001 Bradycardia, unspecified: Secondary | ICD-10-CM | POA: Diagnosis present

## 2019-06-25 DIAGNOSIS — J449 Chronic obstructive pulmonary disease, unspecified: Secondary | ICD-10-CM | POA: Insufficient documentation

## 2019-06-25 DIAGNOSIS — R0602 Shortness of breath: Secondary | ICD-10-CM | POA: Diagnosis not present

## 2019-06-25 DIAGNOSIS — N189 Chronic kidney disease, unspecified: Secondary | ICD-10-CM | POA: Insufficient documentation

## 2019-06-25 DIAGNOSIS — I429 Cardiomyopathy, unspecified: Secondary | ICD-10-CM | POA: Insufficient documentation

## 2019-06-25 DIAGNOSIS — G2581 Restless legs syndrome: Secondary | ICD-10-CM | POA: Diagnosis present

## 2019-06-25 DIAGNOSIS — I10 Essential (primary) hypertension: Secondary | ICD-10-CM

## 2019-06-25 DIAGNOSIS — Z7984 Long term (current) use of oral hypoglycemic drugs: Secondary | ICD-10-CM | POA: Insufficient documentation

## 2019-06-25 DIAGNOSIS — I5023 Acute on chronic systolic (congestive) heart failure: Secondary | ICD-10-CM

## 2019-06-25 DIAGNOSIS — I110 Hypertensive heart disease with heart failure: Secondary | ICD-10-CM | POA: Diagnosis not present

## 2019-06-25 DIAGNOSIS — I428 Other cardiomyopathies: Secondary | ICD-10-CM | POA: Diagnosis not present

## 2019-06-25 DIAGNOSIS — Z79899 Other long term (current) drug therapy: Secondary | ICD-10-CM | POA: Insufficient documentation

## 2019-06-25 DIAGNOSIS — Z7951 Long term (current) use of inhaled steroids: Secondary | ICD-10-CM | POA: Insufficient documentation

## 2019-06-25 DIAGNOSIS — N400 Enlarged prostate without lower urinary tract symptoms: Secondary | ICD-10-CM | POA: Insufficient documentation

## 2019-06-25 DIAGNOSIS — Z6830 Body mass index (BMI) 30.0-30.9, adult: Secondary | ICD-10-CM | POA: Diagnosis not present

## 2019-06-25 DIAGNOSIS — N183 Chronic kidney disease, stage 3 unspecified: Secondary | ICD-10-CM | POA: Diagnosis not present

## 2019-06-25 DIAGNOSIS — Z87891 Personal history of nicotine dependence: Secondary | ICD-10-CM | POA: Insufficient documentation

## 2019-06-25 DIAGNOSIS — I890 Lymphedema, not elsewhere classified: Secondary | ICD-10-CM | POA: Insufficient documentation

## 2019-06-25 DIAGNOSIS — G479 Sleep disorder, unspecified: Secondary | ICD-10-CM | POA: Diagnosis present

## 2019-06-25 DIAGNOSIS — I5043 Acute on chronic combined systolic (congestive) and diastolic (congestive) heart failure: Secondary | ICD-10-CM | POA: Diagnosis not present

## 2019-06-25 DIAGNOSIS — R197 Diarrhea, unspecified: Secondary | ICD-10-CM | POA: Diagnosis present

## 2019-06-25 DIAGNOSIS — Z20822 Contact with and (suspected) exposure to covid-19: Secondary | ICD-10-CM | POA: Diagnosis not present

## 2019-06-25 DIAGNOSIS — N1832 Chronic kidney disease, stage 3b: Secondary | ICD-10-CM | POA: Diagnosis not present

## 2019-06-25 DIAGNOSIS — I248 Other forms of acute ischemic heart disease: Secondary | ICD-10-CM | POA: Diagnosis not present

## 2019-06-25 DIAGNOSIS — N401 Enlarged prostate with lower urinary tract symptoms: Secondary | ICD-10-CM | POA: Diagnosis not present

## 2019-06-25 DIAGNOSIS — I130 Hypertensive heart and chronic kidney disease with heart failure and stage 1 through stage 4 chronic kidney disease, or unspecified chronic kidney disease: Secondary | ICD-10-CM | POA: Insufficient documentation

## 2019-06-25 LAB — BASIC METABOLIC PANEL
Anion gap: 6 (ref 5–15)
BUN: 23 mg/dL (ref 8–23)
CO2: 23 mmol/L (ref 22–32)
Calcium: 8.1 mg/dL — ABNORMAL LOW (ref 8.9–10.3)
Chloride: 110 mmol/L (ref 98–111)
Creatinine, Ser: 1.77 mg/dL — ABNORMAL HIGH (ref 0.61–1.24)
GFR calc Af Amer: 46 mL/min — ABNORMAL LOW (ref >60)
GFR calc non Af Amer: 39 mL/min — ABNORMAL LOW (ref >60)
Glucose, Bld: 240 mg/dL — ABNORMAL HIGH (ref 70–99)
Potassium: 4.3 mmol/L (ref 3.5–5.1)
Sodium: 139 mmol/L (ref 135–145)

## 2019-06-25 LAB — BRAIN NATRIURETIC PEPTIDE: B Natriuretic Peptide: 2820 pg/mL — ABNORMAL HIGH (ref 0.0–100.0)

## 2019-06-25 MED ORDER — SODIUM CHLORIDE FLUSH 0.9 % IV SOLN
INTRAVENOUS | Status: AC
  Filled 2019-06-25: qty 20

## 2019-06-25 MED ORDER — FUROSEMIDE 10 MG/ML IJ SOLN
INTRAMUSCULAR | Status: AC
  Filled 2019-06-25: qty 8

## 2019-06-25 MED ORDER — FUROSEMIDE 10 MG/ML IJ SOLN
80.0000 mg | Freq: Once | INTRAMUSCULAR | Status: AC
  Administered 2019-06-25: 80 mg via INTRAVENOUS

## 2019-06-25 MED ORDER — POTASSIUM CHLORIDE CRYS ER 20 MEQ PO TBCR
40.0000 meq | EXTENDED_RELEASE_TABLET | Freq: Once | ORAL | Status: AC
  Administered 2019-06-25: 40 meq via ORAL

## 2019-06-25 MED ORDER — POTASSIUM CHLORIDE CRYS ER 20 MEQ PO TBCR
EXTENDED_RELEASE_TABLET | ORAL | Status: AC
  Filled 2019-06-25: qty 2

## 2019-06-25 NOTE — Progress Notes (Signed)
Patient ID: Shawn Hayes, male    DOB: 03/21/1955, 64 y.o.   MRN: [id number]  HPI  [name] is a 64 y/o male with a history of DM, anemia, HTN, CKD, COPD, current tobacco use and chronic heart failure.   Echo report from 04/18/19 reviewed and showed an EF of 30-35% along with trivial MR and moderately elevated PA pressure.  Admitted 04/17/19 due to acute on chronic heart failure with anasarca. Initially given IV lasix and then transitioned to oral diuretics. Spironolactone was held due to hyperkalemia. Given prednisone for COPD exacerbation. Discharged after 6 days.   He presents today for a follow-up visit with a chief complaint of moderate shortness of breath upon minimal exertion. He says that his breathing is unchanged from yesterday. He has associated fatigue, intermittent chest pain, pedal edema (improving) and difficulty sleeping along with this. He denies any dizziness, abdominal distention, palpitations or cough.   Received 80mg  IV lasix/ 49meq PO potassium yesterday and says that his legs don't hurt as much and that he "feels a little better".   Past Medical History:  Diagnosis Date  . Anemia   . BPH (benign prostatic hyperplasia)   . CHF (congestive heart failure) ([name])   . Chronic kidney disease   . Controlled type 2 diabetes mellitus without complication ([name])   . COPD (chronic obstructive pulmonary disease) ([name])   . Hypertension   . Idiopathic cardiomyopathy ([name])    Past Surgical History:  Procedure Laterality Date  . APPENDECTOMY    . TONSILLECTOMY     Family History  Problem Relation Age of Onset  . Cancer Brother    Social History   Tobacco Use  . Smoking status: Former Smoker    Types: Cigarettes  . Smokeless tobacco: Never Used  . Tobacco comment: Pt states he quit 1 week ago  Substance Use Topics  . Alcohol use: Not Currently   No Known Allergies  Prior to Admission medications   Medication Sig Start Date End Date Taking? Authorizing Provider   albuterol (VENTOLIN HFA) 108 (90 Base) MCG/ACT inhaler Inhale 2 puffs into the lungs every 6 (six) hours as needed for wheezing or shortness of breath. 04/23/19  Yes [name], [name], MD  budesonide-formoterol [facility]) 160-4.5 MCG/ACT inhaler Inhale 2 puffs into the lungs in the morning and at bedtime. 04/23/19  Yes [name], [name], MD  carvedilol (COREG) 25 MG tablet Take 25 mg by mouth 2 (two) times daily with a meal.   Yes [provider]  furosemide (LASIX) 40 MG tablet Take 1 tablet (40 mg total) by mouth 2 (two) times daily. 06/04/19  Yes [name], FNP  glipiZIDE (GLUCOTROL) 5 MG tablet Take 1 tablet (5 mg total) by mouth 2 (two) times daily. 04/19/19 04/18/20 Yes [name], [name], MD  metFORMIN (GLUCOPHAGE-XR) 500 MG 24 hr tablet Take 500 mg by mouth daily with breakfast. Take 2 tablets in the morning and at night.   Yes [provider]  Multiple Vitamin (MULTIVITAMIN WITH MINERALS) TABS tablet Take 1 tablet by mouth daily.   Yes [provider]  omega-3 acid ethyl esters (LOVAZA) 1 g capsule Take 1 g by mouth 2 (two) times daily.   Yes [provider]  rOPINIRole (REQUIP) 0.5 MG tablet Take 1 tablet (0.5 mg total) by mouth at bedtime. 04/19/19  Yes [name], [name], MD  sacubitril-valsartan (ENTRESTO) 24-26 MG Take 1 tablet by mouth 2 (two) times daily. 04/30/19  Yes [name], FNP  tamsulosin [facility])  0.4 MG CAPS capsule Take 1 capsule (0.4 mg total) by mouth daily. 04/23/19  Yes [name], MD  tiotropium (SPIRIVA) 18 MCG inhalation capsule Place 1 capsule (18 mcg total) into inhaler and inhale daily. 04/23/19  Yes [name], MD     Review of Systems  Constitutional: Positive for fatigue. Negative for appetite change.  HENT: Negative for congestion, postnasal drip and sore throat.   Eyes: Negative.   Respiratory: Positive for shortness of breath (unchanged). Negative for cough.   Cardiovascular: Positive for chest pain (at times)  and leg swelling ("little better"). Negative for palpitations.  Gastrointestinal: Positive for diarrhea. Negative for abdominal distention and abdominal pain.  Endocrine: Negative.   Genitourinary: Negative.   Musculoskeletal: Positive for arthralgias (upper thighs hurt at times). Negative for back pain and neck pain.  Skin: Negative.   Allergic/Immunologic: Negative.   Neurological: Positive for numbness (neuropathy in legs). Negative for dizziness and light-headedness.  Hematological: Negative for adenopathy. Does not bruise/bleed easily.  Psychiatric/Behavioral: Positive for sleep disturbance (sleeping on 1 pillow). Negative for dysphoric mood. The patient is not nervous/anxious.     Vitals:   06/26/19 1009  BP: (!) 164/100  Pulse: 78  Resp: 18  SpO2: 100%  Weight: 233 lb 2 oz (105.7 kg)  Height: 6' (1.829 m)   Wt Readings from Last 3 Encounters:  06/26/19 233 lb 2 oz (105.7 kg)  06/25/19 236 lb 6 oz (107.2 kg)  06/04/19 223 lb 6 oz (101.3 kg)   Lab Results  Component Value Date   CREATININE 1.77 (H) 06/25/2019   CREATININE 1.54 (H) 05/20/2019   CREATININE 1.88 (H) 04/23/2019     Physical Exam Vitals and nursing note reviewed.  Constitutional:      Appearance: He is well-developed.  HENT:     Head: Normocephalic and atraumatic.  Neck:     Vascular: No JVD.  Cardiovascular:     Rate and Rhythm: Normal rate and regular rhythm.  Pulmonary:     Effort: Pulmonary effort is normal. No respiratory distress.     Breath sounds: No rhonchi or rales.  Abdominal:     General: There is distension.     Palpations: Abdomen is soft.     Tenderness: There is no abdominal tenderness.  Musculoskeletal:     Cervical back: Normal range of motion and neck supple.     Right lower leg: No tenderness. Edema (3+ pitting) present.     Left lower leg: No tenderness. Edema (3+ pitting) present.  Skin:    General: Skin is warm and dry.  Neurological:     General: No focal deficit  present.     Mental Status: He is alert and oriented to person, place, and time.  Psychiatric:        Mood and Affect: Mood normal.        Behavior: Behavior normal.     Assessment & Plan:  1: Acute on Chronic heart failure with reduced ejection fraction- - NYHA class III - continues to be moderately fluid overloaded today - weighing daily; reminded to call for an overnight weight gain of >2 pounds or a weekly weight gain of >5 pounds - weight down 3 pounds from last visit here yesterday - received 80mg  IV lasix/ 73meq PO potassium yesterday; will send back for additional dose today - BMP/BNP to be drawn today - not adding salt to his food and is trying to follow a low sodium diet; sister is now cooking for him and  doesn't cook with salt; did eat BBQ, pea salad and cake last night - will have medicaid effective 06/29/19 - saw cardiology (Fath) 05/07/19 - BNP 06/25/19 was 2820.0  2: HTN- - BP still elevated today - saw PCP Ouida Sills) 05/30/19 - BMP 06/25/19 reviewed and showed sodium 139, potassium 4.3, creatinine 1.77 and GFR 39  3: DM- - A1c 05/30/19 was 8.4% - fasting glucose in clinic today was 150  4: Lymphedema- - stage 2 - has worn compression socks in the past but says that the swelling continues - elevate his legs when sitting for long periods of time - does walk daily but edema persists - has heard from lymphapress compression [place] but is waiting to hear what his insurance will pay   Medication bottles were reviewed.   Will have patient return again tomorrow for recheck of symptoms

## 2019-06-25 NOTE — Patient Instructions (Signed)
Continue weighing daily and call for an overnight weight gain of > 2 pounds or a weekly weight gain of >5 pounds. 

## 2019-06-26 ENCOUNTER — Other Ambulatory Visit: Payer: Self-pay | Admitting: Family

## 2019-06-26 ENCOUNTER — Ambulatory Visit
Admission: RE | Admit: 2019-06-26 | Discharge: 2019-06-26 | Disposition: A | Discharge status: Home / Self Care | Payer: Medicare HMO | Source: Ambulatory Visit | Attending: Family | Admitting: Family

## 2019-06-26 ENCOUNTER — Encounter: Payer: Self-pay | Admitting: Family

## 2019-06-26 ENCOUNTER — Ambulatory Visit: Payer: Medicare HMO | Admitting: Family

## 2019-06-26 VITALS — BP 164/100 | HR 78 | Resp 18 | Ht 72.0 in | Wt 233.1 lb

## 2019-06-26 DIAGNOSIS — I10 Essential (primary) hypertension: Secondary | ICD-10-CM

## 2019-06-26 DIAGNOSIS — E1122 Type 2 diabetes mellitus with diabetic chronic kidney disease: Secondary | ICD-10-CM

## 2019-06-26 DIAGNOSIS — N189 Chronic kidney disease, unspecified: Secondary | ICD-10-CM | POA: Insufficient documentation

## 2019-06-26 DIAGNOSIS — Z7951 Long term (current) use of inhaled steroids: Secondary | ICD-10-CM | POA: Insufficient documentation

## 2019-06-26 DIAGNOSIS — I5023 Acute on chronic systolic (congestive) heart failure: Secondary | ICD-10-CM

## 2019-06-26 DIAGNOSIS — Z87891 Personal history of nicotine dependence: Secondary | ICD-10-CM | POA: Insufficient documentation

## 2019-06-26 DIAGNOSIS — I429 Cardiomyopathy, unspecified: Secondary | ICD-10-CM | POA: Insufficient documentation

## 2019-06-26 DIAGNOSIS — J449 Chronic obstructive pulmonary disease, unspecified: Secondary | ICD-10-CM | POA: Insufficient documentation

## 2019-06-26 DIAGNOSIS — Z7984 Long term (current) use of oral hypoglycemic drugs: Secondary | ICD-10-CM | POA: Insufficient documentation

## 2019-06-26 DIAGNOSIS — I130 Hypertensive heart and chronic kidney disease with heart failure and stage 1 through stage 4 chronic kidney disease, or unspecified chronic kidney disease: Secondary | ICD-10-CM | POA: Insufficient documentation

## 2019-06-26 DIAGNOSIS — Z79899 Other long term (current) drug therapy: Secondary | ICD-10-CM | POA: Insufficient documentation

## 2019-06-26 DIAGNOSIS — N400 Enlarged prostate without lower urinary tract symptoms: Secondary | ICD-10-CM | POA: Insufficient documentation

## 2019-06-26 DIAGNOSIS — I890 Lymphedema, not elsewhere classified: Secondary | ICD-10-CM

## 2019-06-26 DIAGNOSIS — N183 Chronic kidney disease, stage 3 unspecified: Secondary | ICD-10-CM

## 2019-06-26 LAB — BASIC METABOLIC PANEL
Anion gap: 6 (ref 5–15)
BUN: 26 mg/dL — ABNORMAL HIGH (ref 8–23)
CO2: 23 mmol/L (ref 22–32)
Calcium: 8.1 mg/dL — ABNORMAL LOW (ref 8.9–10.3)
Chloride: 111 mmol/L (ref 98–111)
Creatinine, Ser: 1.89 mg/dL — ABNORMAL HIGH (ref 0.61–1.24)
GFR calc Af Amer: 42 mL/min — ABNORMAL LOW (ref >60)
GFR calc non Af Amer: 36 mL/min — ABNORMAL LOW (ref >60)
Glucose, Bld: 145 mg/dL — ABNORMAL HIGH (ref 70–99)
Potassium: 4.4 mmol/L (ref 3.5–5.1)
Sodium: 140 mmol/L (ref 135–145)

## 2019-06-26 LAB — GLUCOSE, CAPILLARY: Glucose-Capillary: 150 mg/dL — ABNORMAL HIGH (ref 70–99)

## 2019-06-26 LAB — BRAIN NATRIURETIC PEPTIDE: B Natriuretic Peptide: 3147 pg/mL — ABNORMAL HIGH (ref 0.0–100.0)

## 2019-06-26 MED ORDER — FUROSEMIDE 10 MG/ML IJ SOLN
INTRAMUSCULAR | Status: AC
  Administered 2019-06-26: 80 mg via INTRAVENOUS
  Filled 2019-06-26: qty 8

## 2019-06-26 MED ORDER — POTASSIUM CHLORIDE CRYS ER 20 MEQ PO TBCR: 40.0000 meq | EXTENDED_RELEASE_TABLET | Freq: Once | ORAL | Status: AC

## 2019-06-26 MED ORDER — POTASSIUM CHLORIDE CRYS ER 20 MEQ PO TBCR
EXTENDED_RELEASE_TABLET | ORAL | Status: AC
  Administered 2019-06-26: 40 meq via ORAL
  Filled 2019-06-26: qty 2

## 2019-06-26 MED ORDER — FUROSEMIDE 10 MG/ML IJ SOLN: 80.0000 mg | Freq: Once | INTRAMUSCULAR | Status: AC

## 2019-06-26 NOTE — Progress Notes (Deleted)
Patient ID: Shawn Hayes, male    DOB: 09-29-1955, 64 y.o.   MRN: [id number]  HPI  Shawn Hayes is a 64 y/o male with a history of DM, anemia, HTN, CKD, COPD, current tobacco use and chronic heart failure.   Echo report from 04/18/19 reviewed and showed an EF of 30-35% along with trivial Shawn and moderately elevated PA pressure.  Admitted 04/17/19 due to acute on chronic heart failure with anasarca. Initially given IV lasix and then transitioned to oral diuretics. Spironolactone was held due to hyperkalemia. Given prednisone for COPD exacerbation. Discharged after 6 days.   Shawn Hayes presents today for a follow-up visit with a chief complaint of    Received 80mg  IV lasix/ 34meq PO potassium again yesterday   Past Medical History:  Diagnosis Date  . Anemia   . BPH (benign prostatic hyperplasia)   . CHF (congestive heart failure) ([place])   . Chronic kidney disease   . Controlled type 2 diabetes mellitus without complication ([place])   . COPD (chronic obstructive pulmonary disease) ([place])   . Hypertension   . Idiopathic cardiomyopathy ([place])    Past Surgical History:  Procedure Laterality Date  . APPENDECTOMY    . TONSILLECTOMY     Family History  Problem Relation Age of Onset  . Cancer Brother    Social History   Tobacco Use  . Smoking status: Former Smoker    Types: Cigarettes  . Smokeless tobacco: Never Used  . Tobacco comment: Pt states Shawn Hayes quit 1 week ago  Substance Use Topics  . Alcohol use: Not Currently   No Known Allergies     Review of Systems  Constitutional: Positive for fatigue. Negative for appetite change.  HENT: Negative for congestion, postnasal drip and sore throat.   Eyes: Negative.   Respiratory: Positive for shortness of breath (unchanged). Negative for cough.   Cardiovascular: Positive for chest pain (at times) and leg swelling ("little better"). Negative for palpitations.  Gastrointestinal: Positive for diarrhea. Negative for abdominal distention and  abdominal pain.  Endocrine: Negative.   Genitourinary: Negative.   Musculoskeletal: Positive for arthralgias (upper thighs hurt at times). Negative for back pain and neck pain.  Skin: Negative.   Allergic/Immunologic: Negative.   Neurological: Positive for numbness (neuropathy in legs). Negative for dizziness and light-headedness.  Hematological: Negative for adenopathy. Does not bruise/bleed easily.  Psychiatric/Behavioral: Positive for sleep disturbance (sleeping on 1 pillow). Negative for dysphoric mood. The patient is not nervous/anxious.        Physical Exam Vitals and nursing note reviewed.  Constitutional:      Appearance: Shawn Hayes is well-developed.  HENT:     Head: Normocephalic and atraumatic.  Neck:     Vascular: No JVD.  Cardiovascular:     Rate and Rhythm: Normal rate and regular rhythm.  Pulmonary:     Effort: Pulmonary effort is normal. No respiratory distress.     Breath sounds: No rhonchi or rales.  Abdominal:     General: There is distension.     Palpations: Abdomen is soft.     Tenderness: There is no abdominal tenderness.  Musculoskeletal:     Cervical back: Normal range of motion and neck supple.     Right lower leg: No tenderness. Edema (3+ pitting) present.     Left lower leg: No tenderness. Edema (3+ pitting) present.  Skin:    General: Skin is warm and dry.  Neurological:     General: No focal deficit present.  Mental Status: Shawn Hayes is alert and oriented to person, place, and time.  Psychiatric:        Mood and Affect: Mood normal.        Behavior: Behavior normal.     Assessment & Plan:  1: Acute on Chronic heart failure with reduced ejection fraction- - NYHA class III - continues to be moderately fluid overloaded today - weighing daily; reminded to call for an overnight weight gain of >2 pounds or a weekly weight gain of >5 pounds - weight 233.2 pounds from last visit here yesterday - received 80mg  IV lasix/ 45meq PO potassium yesterday;(2nd  time this week)  - not adding salt to his food and is trying to follow a low sodium diet; sister is now cooking for him and doesn't cook with salt; did eat BBQ, pea salad and cake last night - will have medicaid effective 06/29/19 - saw cardiology ([facility]) 05/07/19 - BNP 06/26/19 was   2: HTN- - BP  - saw PCP Ouida Sills) 05/30/19 - BMP 06/26/19 reviewed and showed sodium , potassium , creatinine  and GFR   3: DM- - A1c 05/30/19 was 8.4% - fasting glucose in clinic today was   4: Lymphedema- - stage 2 - has worn compression socks in the past but says that the swelling continues - elevate his legs when sitting for long periods of time - does walk daily but edema persists - has heard from lymphapress compression [place] but is waiting to hear what his insurance will pay   Medication bottles were reviewed.

## 2019-06-26 NOTE — Patient Instructions (Signed)
Continue weighing daily and call for an overnight weight gain of > 2 pounds or a weekly weight gain of >5 pounds. 

## 2019-06-27 ENCOUNTER — Ambulatory Visit: Payer: Self-pay | Admitting: Family

## 2019-06-27 ENCOUNTER — Other Ambulatory Visit: Payer: Self-pay

## 2019-06-27 ENCOUNTER — Emergency Department: Payer: Medicare HMO

## 2019-06-27 ENCOUNTER — Encounter: Payer: Self-pay | Admitting: Internal Medicine

## 2019-06-27 ENCOUNTER — Inpatient Hospital Stay
Admission: EM | Admit: 2019-06-27 | Discharge: 2019-07-04 | DRG: 291 | Disposition: A | Discharge status: Home / Self Care | Payer: Medicare HMO | Attending: Internal Medicine | Admitting: Internal Medicine

## 2019-06-27 ENCOUNTER — Telehealth: Payer: Self-pay | Admitting: Family

## 2019-06-27 DIAGNOSIS — I5043 Acute on chronic combined systolic (congestive) and diastolic (congestive) heart failure: Secondary | ICD-10-CM | POA: Diagnosis present

## 2019-06-27 DIAGNOSIS — G2581 Restless legs syndrome: Secondary | ICD-10-CM | POA: Diagnosis present

## 2019-06-27 DIAGNOSIS — Z87891 Personal history of nicotine dependence: Secondary | ICD-10-CM | POA: Diagnosis not present

## 2019-06-27 DIAGNOSIS — R197 Diarrhea, unspecified: Secondary | ICD-10-CM | POA: Diagnosis present

## 2019-06-27 DIAGNOSIS — Z7984 Long term (current) use of oral hypoglycemic drugs: Secondary | ICD-10-CM | POA: Diagnosis not present

## 2019-06-27 DIAGNOSIS — G479 Sleep disorder, unspecified: Secondary | ICD-10-CM | POA: Diagnosis present

## 2019-06-27 DIAGNOSIS — R0602 Shortness of breath: Secondary | ICD-10-CM | POA: Diagnosis present

## 2019-06-27 DIAGNOSIS — I248 Other forms of acute ischemic heart disease: Secondary | ICD-10-CM | POA: Diagnosis present

## 2019-06-27 DIAGNOSIS — N183 Chronic kidney disease, stage 3 unspecified: Secondary | ICD-10-CM

## 2019-06-27 DIAGNOSIS — Z20822 Contact with and (suspected) exposure to covid-19: Secondary | ICD-10-CM | POA: Diagnosis present

## 2019-06-27 DIAGNOSIS — N401 Enlarged prostate with lower urinary tract symptoms: Secondary | ICD-10-CM | POA: Diagnosis present

## 2019-06-27 DIAGNOSIS — R001 Bradycardia, unspecified: Secondary | ICD-10-CM | POA: Diagnosis present

## 2019-06-27 DIAGNOSIS — I5023 Acute on chronic systolic (congestive) heart failure: Secondary | ICD-10-CM

## 2019-06-27 DIAGNOSIS — Z79899 Other long term (current) drug therapy: Secondary | ICD-10-CM | POA: Diagnosis not present

## 2019-06-27 DIAGNOSIS — N1832 Chronic kidney disease, stage 3b: Secondary | ICD-10-CM | POA: Diagnosis present

## 2019-06-27 DIAGNOSIS — J449 Chronic obstructive pulmonary disease, unspecified: Secondary | ICD-10-CM | POA: Diagnosis present

## 2019-06-27 DIAGNOSIS — Z6830 Body mass index (BMI) 30.0-30.9, adult: Secondary | ICD-10-CM | POA: Diagnosis not present

## 2019-06-27 DIAGNOSIS — I428 Other cardiomyopathies: Secondary | ICD-10-CM | POA: Diagnosis present

## 2019-06-27 DIAGNOSIS — Z7951 Long term (current) use of inhaled steroids: Secondary | ICD-10-CM | POA: Diagnosis not present

## 2019-06-27 DIAGNOSIS — I130 Hypertensive heart and chronic kidney disease with heart failure and stage 1 through stage 4 chronic kidney disease, or unspecified chronic kidney disease: Principal | ICD-10-CM | POA: Diagnosis present

## 2019-06-27 DIAGNOSIS — E1122 Type 2 diabetes mellitus with diabetic chronic kidney disease: Secondary | ICD-10-CM | POA: Diagnosis present

## 2019-06-27 LAB — CBC WITH DIFFERENTIAL/PLATELET
Abs Immature Granulocytes: 0.02 10*3/uL (ref 0.00–0.07)
Basophils Absolute: 0.1 10*3/uL (ref 0.0–0.1)
Basophils Relative: 1 %
Eosinophils Absolute: 0.4 10*3/uL (ref 0.0–0.5)
Eosinophils Relative: 6 %
HCT: 32.2 % — ABNORMAL LOW (ref 39.0–52.0)
Hemoglobin: 10.1 g/dL — ABNORMAL LOW (ref 13.0–17.0)
Immature Granulocytes: 0 %
Lymphocytes Relative: 29 %
Lymphs Abs: 1.8 10*3/uL (ref 0.7–4.0)
MCH: 28.2 pg (ref 26.0–34.0)
MCHC: 31.4 g/dL (ref 30.0–36.0)
MCV: 89.9 fL (ref 80.0–100.0)
Monocytes Absolute: 0.4 10*3/uL (ref 0.1–1.0)
Monocytes Relative: 6 %
Neutro Abs: 3.5 10*3/uL (ref 1.7–7.7)
Neutrophils Relative %: 58 %
Platelets: 207 10*3/uL (ref 150–400)
RBC: 3.58 MIL/uL — ABNORMAL LOW (ref 4.22–5.81)
RDW: 16.7 % — ABNORMAL HIGH (ref 11.5–15.5)
WBC: 6.1 10*3/uL (ref 4.0–10.5)
nRBC: 0 % (ref 0.0–0.2)

## 2019-06-27 LAB — COMPREHENSIVE METABOLIC PANEL
ALT: 12 U/L (ref 0–44)
AST: 15 U/L (ref 15–41)
Albumin: 3.3 g/dL — ABNORMAL LOW (ref 3.5–5.0)
Alkaline Phosphatase: 94 U/L (ref 38–126)
Anion gap: 7 (ref 5–15)
BUN: 28 mg/dL — ABNORMAL HIGH (ref 8–23)
CO2: 24 mmol/L (ref 22–32)
Calcium: 8.8 mg/dL — ABNORMAL LOW (ref 8.9–10.3)
Chloride: 109 mmol/L (ref 98–111)
Creatinine, Ser: 1.76 mg/dL — ABNORMAL HIGH (ref 0.61–1.24)
GFR calc Af Amer: 46 mL/min — ABNORMAL LOW (ref >60)
GFR calc non Af Amer: 40 mL/min — ABNORMAL LOW (ref >60)
Glucose, Bld: 141 mg/dL — ABNORMAL HIGH (ref 70–99)
Potassium: 4.3 mmol/L (ref 3.5–5.1)
Sodium: 140 mmol/L (ref 135–145)
Total Bilirubin: 0.9 mg/dL (ref 0.3–1.2)
Total Protein: 7.4 g/dL (ref 6.5–8.1)

## 2019-06-27 LAB — RESPIRATORY PANEL BY RT PCR (FLU A&B, COVID)
Influenza A by PCR: NEGATIVE
Influenza B by PCR: NEGATIVE
SARS Coronavirus 2 by RT PCR: NEGATIVE

## 2019-06-27 LAB — TROPONIN I (HIGH SENSITIVITY)
Troponin I (High Sensitivity): 31 ng/L — ABNORMAL HIGH (ref <18)
Troponin I (High Sensitivity): 29 ng/L — ABNORMAL HIGH (ref <18)

## 2019-06-27 LAB — GLUCOSE, CAPILLARY
Glucose-Capillary: 108 mg/dL — ABNORMAL HIGH (ref 70–99)
Glucose-Capillary: 177 mg/dL — ABNORMAL HIGH (ref 70–99)

## 2019-06-27 LAB — BRAIN NATRIURETIC PEPTIDE: B Natriuretic Peptide: 3153 pg/mL — ABNORMAL HIGH (ref 0.0–100.0)

## 2019-06-27 MED ORDER — SODIUM CHLORIDE 0.9 % IV SOLN: 250.0000 mL | INTRAVENOUS | Status: DC | PRN

## 2019-06-27 MED ORDER — HEPARIN SODIUM (PORCINE) 5000 UNIT/ML IJ SOLN
5000.0000 [IU] | Freq: Three times a day (TID) | INTRAMUSCULAR | Status: DC
  Administered 2019-06-27 – 2019-07-04 (×21): 5000 [IU] via SUBCUTANEOUS
  Filled 2019-06-27 (×21): qty 1

## 2019-06-27 MED ORDER — INSULIN ASPART 100 UNIT/ML ~~LOC~~ SOLN
0.0000 [IU] | Freq: Three times a day (TID) | SUBCUTANEOUS | Status: DC
  Administered 2019-06-28 – 2019-06-29 (×3): 1 [IU] via SUBCUTANEOUS
  Administered 2019-06-30: 2 [IU] via SUBCUTANEOUS
  Administered 2019-07-01 – 2019-07-02 (×3): 1 [IU] via SUBCUTANEOUS
  Administered 2019-07-02: 2 [IU] via SUBCUTANEOUS
  Administered 2019-07-03 – 2019-07-04 (×3): 1 [IU] via SUBCUTANEOUS
  Filled 2019-06-27 (×12): qty 1

## 2019-06-27 MED ORDER — SACUBITRIL-VALSARTAN 24-26 MG PO TABS
1.0000 | ORAL_TABLET | Freq: Two times a day (BID) | ORAL | Status: DC
  Administered 2019-06-27 – 2019-07-04 (×14): 1 via ORAL
  Filled 2019-06-27 (×14): qty 1

## 2019-06-27 MED ORDER — SODIUM CHLORIDE 0.9% FLUSH: 3.0000 mL | INTRAVENOUS | Status: DC | PRN

## 2019-06-27 MED ORDER — SENNOSIDES-DOCUSATE SODIUM 8.6-50 MG PO TABS: 1.0000 | ORAL_TABLET | Freq: Every evening | ORAL | Status: DC | PRN

## 2019-06-27 MED ORDER — TIOTROPIUM BROMIDE MONOHYDRATE 18 MCG IN CAPS
18.0000 ug | ORAL_CAPSULE | Freq: Every day | RESPIRATORY_TRACT | Status: DC
  Administered 2019-06-28 – 2019-07-04 (×7): 18 ug via RESPIRATORY_TRACT
  Filled 2019-06-27 (×2): qty 5

## 2019-06-27 MED ORDER — SODIUM CHLORIDE 0.9% FLUSH
3.0000 mL | Freq: Two times a day (BID) | INTRAVENOUS | Status: DC
  Administered 2019-06-27 – 2019-07-04 (×10): 3 mL via INTRAVENOUS

## 2019-06-27 MED ORDER — FUROSEMIDE 10 MG/ML IJ SOLN
40.0000 mg | Freq: Once | INTRAMUSCULAR | Status: AC
  Administered 2019-06-27: 13:00:00 40 mg via INTRAVENOUS
  Filled 2019-06-27: qty 4

## 2019-06-27 MED ORDER — POLYETHYLENE GLYCOL 3350 17 G PO PACK
17.0000 g | PACK | Freq: Every day | ORAL | Status: DC | PRN
  Administered 2019-06-27: 17 g via ORAL
  Filled 2019-06-27: qty 1

## 2019-06-27 MED ORDER — MOMETASONE FURO-FORMOTEROL FUM 200-5 MCG/ACT IN AERO
2.0000 | INHALATION_SPRAY | Freq: Two times a day (BID) | RESPIRATORY_TRACT | Status: DC
  Administered 2019-06-27 – 2019-07-04 (×14): 2 via RESPIRATORY_TRACT
  Filled 2019-06-27: qty 8.8

## 2019-06-27 MED ORDER — ROPINIROLE HCL 1 MG PO TABS
0.5000 mg | ORAL_TABLET | Freq: Every day | ORAL | Status: DC
  Administered 2019-06-27 – 2019-07-03 (×7): 0.5 mg via ORAL
  Filled 2019-06-27 (×7): qty 1

## 2019-06-27 MED ORDER — ALBUTEROL SULFATE (2.5 MG/3ML) 0.083% IN NEBU
2.5000 mg | INHALATION_SOLUTION | Freq: Four times a day (QID) | RESPIRATORY_TRACT | Status: DC | PRN
  Administered 2019-06-28 – 2019-07-01 (×2): 2.5 mg via RESPIRATORY_TRACT
  Filled 2019-06-27 (×2): qty 3

## 2019-06-27 MED ORDER — ACETAMINOPHEN 325 MG PO TABS
650.0000 mg | ORAL_TABLET | ORAL | Status: DC | PRN
  Administered 2019-07-01 – 2019-07-02 (×2): 650 mg via ORAL
  Filled 2019-06-27 (×2): qty 2

## 2019-06-27 MED ORDER — OMEGA-3-ACID ETHYL ESTERS 1 G PO CAPS
1.0000 g | ORAL_CAPSULE | Freq: Two times a day (BID) | ORAL | Status: DC
  Administered 2019-06-27 – 2019-07-04 (×14): 1 g via ORAL
  Filled 2019-06-27 (×14): qty 1

## 2019-06-27 MED ORDER — TAMSULOSIN HCL 0.4 MG PO CAPS
0.4000 mg | ORAL_CAPSULE | Freq: Every day | ORAL | Status: DC
  Administered 2019-06-28 – 2019-07-04 (×7): 0.4 mg via ORAL
  Filled 2019-06-27 (×7): qty 1

## 2019-06-27 MED ORDER — CARVEDILOL 25 MG PO TABS
25.0000 mg | ORAL_TABLET | Freq: Two times a day (BID) | ORAL | Status: DC
  Administered 2019-06-27 – 2019-07-01 (×8): 25 mg via ORAL
  Filled 2019-06-27 (×8): qty 1

## 2019-06-27 MED ORDER — FUROSEMIDE 10 MG/ML IJ SOLN
4.0000 mg/h | INTRAVENOUS | Status: DC
  Administered 2019-06-27 – 2019-06-30 (×2): 4 mg/h via INTRAVENOUS
  Administered 2019-07-02: 8 mg/h via INTRAVENOUS
  Filled 2019-06-27 (×4): qty 25

## 2019-06-27 MED ORDER — ONDANSETRON HCL 4 MG/2ML IJ SOLN: 4.0000 mg | Freq: Four times a day (QID) | INTRAMUSCULAR | Status: DC | PRN

## 2019-06-27 NOTE — H&P (Addendum)
History and Physical:    Shawn Hayes   EPP:[id number] DOB: 1955/06/12 DOA: 06/27/2019  Referring MD/provider: Derrell Lolling, MD PCP: [name], MD   Patient coming from: Home  Chief Complaint: Shortness of breath  History of Present Illness:   Shawn Hayes is an 64 y.o. male with medical history significant for type 2 diabetes mellitus, hypertension, CKD stage IIIb, chronic systolic and diastolic CHF, nonischemic cardiomyopathy, BPH, presented to the hospital because of shortness of breath.  He said he has been having shortness of breath for about a month now.  This has been associated with increasing bilateral leg swelling.  He has been taking his Lasix at home without any improvement.  He even went to the heart failure clinic and he was given IV Lasix for 2 consecutive days in the past 2 days but his symptoms did not improve.  His shortness of breath was much worse today and his nurse petitioner advised him to come to the emergency room for further evaluation.  He noticed he had developed a cough and wheezing in the past 2 days.  He also has trouble laying down flat and sometimes has to wake up in the middle of the night to catch his breath.  No other complaints.  He lives alone at home.  His family includes a brother, and sister and a nephew.    ED Course:  The patient was hypertensive in the emergency room.  Oxygen saturation was normal on room air.  BNP was 3,153.  Creatinine was 1.76 which is around his baseline.  Chest x-ray showed cardiomegaly and mild interstitial edema.  He was given IV Lasix 40 mg in the ED.  ROS:   ROS all other systems reviewed were negative.  Past Medical History:   Past Medical History:  Diagnosis Date  . Anemia   . BPH (benign prostatic hyperplasia)   . CHF (congestive heart failure) ([place])   . Chronic kidney disease   . Controlled type 2 diabetes mellitus without complication ([place])   . COPD (chronic obstructive pulmonary disease)  ([place])   . Hypertension   . Idiopathic cardiomyopathy ([place])     Past Surgical History:   Past Surgical History:  Procedure Laterality Date  . APPENDECTOMY    . TONSILLECTOMY      Social History:   Social History   Socioeconomic History  . Marital status: Single    Spouse name: Not on file  . Number of children: Not on file  . Years of education: Not on file  . Highest education level: Not on file  Occupational History  . Not on file  Tobacco Use  . Smoking status: Former Smoker    Types: Cigarettes  . Smokeless tobacco: Never Used  . Tobacco comment: Pt states he quit 1 week ago  Substance and Sexual Activity  . Alcohol use: Not Currently  . Drug use: Not Currently  . Sexual activity: Not Currently  Other Topics Concern  . 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 Quit smoking- sep 28th, 2020/80ppd. No alcohol.    Social Determinants of Health   Financial Resource Strain:   . Difficulty of Paying Living Expenses:   Food Insecurity:   . Worried About Charity fundraiser in the Last Year:   . [place] in the Last Year:   Transportation Needs:   . Lack of  Transportation (Medical):   Marland Kitchen Lack of Transportation (Non-Medical):   Physical Activity:   . Days of Exercise per Week:   . Minutes of Exercise per Session:   Stress:   . Feeling of Stress :   Social Connections:   . Frequency of Communication with Friends and Family:   . Frequency of Social Gatherings with Friends and Family:   . Attends Religious Services:   . Active Member of Clubs or Organizations:   . Attends Archivist Meetings:   Marland Kitchen Marital Status:   Intimate Partner Violence:   . Fear of Current or Ex-Partner:   . Emotionally Abused:   Marland Kitchen Physically Abused:   . Sexually Abused:     Allergies   Patient has no known allergies.  Family history:   Family History  Problem Relation Age of  Onset  . Cancer Brother     Current Medications:   Prior to Admission medications   Medication Sig Start Date End Date Taking? Authorizing Provider  albuterol (VENTOLIN HFA) 108 (90 Base) MCG/ACT inhaler Inhale 2 puffs into the lungs every 6 (six) hours as needed for wheezing or shortness of breath. 04/23/19   [name], MD  budesonide-formoterol [facility]) 160-4.5 MCG/ACT inhaler Inhale 2 puffs into the lungs in the morning and at bedtime. 04/23/19   [name], MD  carvedilol (COREG) 25 MG tablet Take 25 mg by mouth 2 (two) times daily with a meal.    [provider]  furosemide (LASIX) 40 MG tablet Take 1 tablet (40 mg total) by mouth 2 (two) times daily. 06/04/19   [name], FNP  glipiZIDE (GLUCOTROL) 5 MG tablet Take 1 tablet (5 mg total) by mouth 2 (two) times daily. 04/19/19 04/18/20  [name], MD  metFORMIN (GLUCOPHAGE-XR) 500 MG 24 hr tablet Take 500 mg by mouth daily with breakfast. Take 2 tablets in the morning and at night.    [provider]  Multiple Vitamin (MULTIVITAMIN WITH MINERALS) TABS tablet Take 1 tablet by mouth daily.    [provider]  omega-3 acid ethyl esters (LOVAZA) 1 g capsule Take 1 g by mouth 2 (two) times daily.    [provider]  rOPINIRole (REQUIP) 0.5 MG tablet Take 1 tablet (0.5 mg total) by mouth at bedtime. 04/19/19   [name], MD  sacubitril-valsartan (ENTRESTO) 24-26 MG Take 1 tablet by mouth 2 (two) times daily. 04/30/19   [name], FNP  tamsulosin (FLOMAX) 0.4 MG CAPS capsule Take 1 capsule (0.4 mg total) by mouth daily. 04/23/19   [name], MD  tiotropium (SPIRIVA) 18 MCG inhalation capsule Place 1 capsule (18 mcg total) into inhaler and inhale daily. 04/23/19   [name], MD    Physical Exam:   Vitals:   06/27/19 1049 06/27/19 1050 06/27/19 1200 06/27/19 1230  BP: (!) 165/98  (!) 167/109 (!) 155/106  Pulse: 76  71 72  Resp: 16  20 19   Temp: 98 F (36.7 C)      TempSrc: Oral     SpO2: 98%  99% 97%  Weight:  105.7 kg    Height:  6\' 1"  (1.854 m)       Physical Exam: Blood pressure (!) 155/106, pulse 72, temperature 98 F (36.7 C), temperature source Oral, resp. rate 19, height 6\' 1"  (1.854 m), weight 105.7 kg, SpO2 97 %. Gen: No acute distress. Head: Normocephalic, atraumatic. Eyes: Pupils equal, round and reactive to light. Extraocular movements intact.  Sclerae nonicteric.  Mouth: Moist mucous membranes Neck: Supple, no thyromegaly, no lymphadenopathy, no jugular venous distention. Chest: Air entry adequate bilaterally.  Bibasilar rales heard but no wheezing. CV: Heart sounds are regular with an S1, S2. No murmurs, rubs or gallops.  Abdomen: Soft, nontender, nondistended with normal active bowel sounds. No palpable masses. Extremities: Significant bilateral leg edema from the knees to the ankles.  Extremities are without clubbing, or cyanosis. No edema. Pedal pulses 2+. Skin: Warm and dry. No rashes Neuro: Alert and oriented times 3; grossly nonfocal.  Psych: Insight is good and judgment is appropriate. Mood and affect normal.   Data Review:    Labs: Basic Metabolic Panel: Recent Labs  Lab 06/25/19 1353 06/26/19 1056 06/27/19 1059  NA 139 140 140  K 4.3 4.4 4.3  CL 110 111 109  CO2 23 23 24   GLUCOSE 240* 145* 141*  BUN 23 26* 28*  CREATININE 1.77* 1.89* 1.76*  CALCIUM 8.1* 8.1* 8.8*   Liver Function Tests: Recent Labs  Lab 06/27/19 1059  AST 15  ALT 12  ALKPHOS 94  BILITOT 0.9  PROT 7.4  ALBUMIN 3.3*   No results for input(s): LIPASE, AMYLASE in the last 168 hours. No results for input(s): AMMONIA in the last 168 hours. CBC: Recent Labs  Lab 06/27/19 1059  WBC 6.1  NEUTROABS 3.5  HGB 10.1*  HCT 32.2*  MCV 89.9  PLT 207   Cardiac Enzymes: No results for input(s): CKTOTAL, CKMB, CKMBINDEX, TROPONINI in the last 168 hours.  BNP (last 3 results) No results for input(s): PROBNP in the last 8760  hours. CBG: Recent Labs  Lab 06/26/19 1004  GLUCAP 150*    Urinalysis No results found for: COLORURINE, APPEARANCEUR, LABSPEC, PHURINE, GLUCOSEU, HGBUR, BILIRUBINUR, KETONESUR, PROTEINUR, UROBILINOGEN, NITRITE, LEUKOCYTESUR    Radiographic Studies: DG Chest 2 View  Result Date: 06/27/2019 CLINICAL DATA:  Elevated BNP and renal function labs. Failed diuresis. Shortness of breath. EXAM: CHEST - 2 VIEW COMPARISON:  04/17/2019 FINDINGS: Heart is enlarged. There is pulmonary vascular congestion. Mild interstitial edema is also present. No overt alveolar edema. No consolidations or pleural effusions. IMPRESSION: Cardiomegaly and mild interstitial edema. Electronically Signed   By: Nolon Nations M.D.   On: 06/27/2019 13:21    EKG: Independently reviewed.  Normal sinus rhythm   Assessment/Plan:   Principal Problem:   Acute on chronic combined systolic and diastolic CHF (congestive heart failure) (HCC) Active Problems:   CKD stage 3 due to type 2 diabetes mellitus (HCC)   Acute on chronic systolic and diastolic CHF: He failed outpatient IV and oral Lasix.  Admit to progressive care unit.  He received 40 mg of IV Lasix in the ED.  Treat with IV Lasix infusion.  Continue Entresto and Coreg.  Monitor BMP, daily weights and urine output.  Chart review showed that 2D echo in February 2021 revealed EF estimated at 30 to 11%, grade 1 diastolic dysfunction moderately elevated pulmonary artery systolic pressure.  Mildly elevated troponin: Troponin of 31.  Chart review shows that on April 17, 2019, troponins were 27 and 29.  Elevated troponin is probably due to underlying CKD.  CAD stage IIIb: Creatinine is around baseline.  Monitor BMP closely.  Type II DM: Hold Metformin and glipizide.  NovoLog.  For hyperglycemia.  Pretension: Continue antihypertensives.  Body mass index is 30.74 kg/m.  (morbid obesity): This complicates overall prognosis and care.  Other information:   DVT  prophylaxis: Heparin Code Status: Full code. Family Communication: Plan discussed with  patient Disposition Plan: Home in 2 to 3 days Consults called: None Admission status: Inpatient  The medical decision making on this patient was of high complexity and the patient is at high risk for clinical deterioration, therefore this is a level 3 visit.  Time spent 70 minutes  [place] [facility]   How to contact the [facility] Attending or Consulting provider [place] or covering provider during after hours [place], for this patient?   1. Check the care team in [facility] and look for a) attending/consulting [facility] provider listed and b) the [facility] team listed 2. Log into [email or web address] and use [place]'s universal password to access. If you do not have the password, please contact the hospital operator. 3. Locate the [facility] provider you are looking for under [facility] and page to a number that you can be directly reached. 4. If you still have difficulty reaching the provider, please page the [facility] (Director on Call) for the Hospitalists listed on amion for assistance.  06/27/2019, 3:02 PM

## 2019-06-27 NOTE — ED Notes (Signed)
Pt lying in bed speaking with this RN. Pt unable to speak in long sentences without shob, O2 is 100% on RA. Pt states he doesn't feel bad but "everyone tells me that I am doing bad". Pt bilateral ankles swollen and slightly red

## 2019-06-27 NOTE — ED Notes (Signed)
Pt otf for imaging 

## 2019-06-27 NOTE — ED Triage Notes (Signed)
Patient to ER for c/o elevated BNP (>3000) and renal function labs. Dr. Bethanne Ginger NP called stating patient received IV Lasix twice in the last week, was sending for failed diuresis. Patient also c/o shortness of breath.

## 2019-06-27 NOTE — ED Provider Notes (Signed)
[facility] Emergency Department Provider Note  ____________________________________________   First MD Initiated Contact with Patient 06/27/19 1200     (approximate)  I have reviewed the triage vital signs and the nursing notes.  History  Chief Complaint Shortness of Breath    HPI Shawn Hayes is a 64 y.o. male with hx of HF, idiopathic cardiomyopathy (EF 30-35%), CKD who presents from clinic w/ concern for HF exacerbation with failed outpatient management (including several doses of IV Lasix in clinic). Sent from HF clinic for admission for IV diuresis.   Patient states over the last several weeks to months he has been increasingly short of breath.  Symptoms have been constant since onset and progressively worsening.  Especially experiencing dyspnea on exertion.  Occasionally experiences some mild chest discomfort with exertion as well.  Reports over the last several weeks he has had an increase in his bilateral lower extremity swelling, states his legs are twice as swollen as they normally are.  Symmetric.  Reports compliance with his outpatient medications, including his oral diuretic.  Has had several doses of IV diuresis through his clinic over the last week and attempt to manage his symptoms, however he denies any significant improvement.  As such, he was referred to the ED for admission for IV diuresis.   Past Medical Hx Past Medical History:  Diagnosis Date  . Anemia   . BPH (benign prostatic hyperplasia)   . CHF (congestive heart failure) ([place])   . Chronic kidney disease   . Controlled type 2 diabetes mellitus without complication ([place])   . COPD (chronic obstructive pulmonary disease) ([place])   . Hypertension   . Idiopathic cardiomyopathy [facility])     Problem List Patient Active Problem List   Diagnosis Date Noted  . Hyperkalemia   . Acute kidney injury superimposed on CKD ([place])   . COPD with acute exacerbation ([place])   . Benign prostatic  hyperplasia with urinary hesitancy   . Essential hypertension   . Acute on chronic HFrEF (heart failure with reduced ejection fraction) ([place]) 04/18/2019  . Idiopathic cardiomyopathy ([place])   . Controlled type 2 diabetes mellitus without complication ([place])   . CKD stage 3 due to type 2 diabetes mellitus ([place])   . Weakness   . RLS (restless legs syndrome)   . Normocytic anemia 11/28/2018    Past Surgical Hx Past Surgical History:  Procedure Laterality Date  . APPENDECTOMY    . TONSILLECTOMY      Medications Prior to Admission medications   Medication Sig Start Date End Date Taking? Authorizing Provider  albuterol (VENTOLIN HFA) 108 (90 Base) MCG/ACT inhaler Inhale 2 puffs into the lungs every 6 (six) hours as needed for wheezing or shortness of breath. 04/23/19   [name], MD  budesonide-formoterol [facility]) 160-4.5 MCG/ACT inhaler Inhale 2 puffs into the lungs in the morning and at bedtime. 04/23/19   [name], MD  carvedilol (COREG) 25 MG tablet Take 25 mg by mouth 2 (two) times daily with a meal.    [provider]  furosemide (LASIX) 40 MG tablet Take 1 tablet (40 mg total) by mouth 2 (two) times daily. 06/04/19   [name], FNP  glipiZIDE (GLUCOTROL) 5 MG tablet Take 1 tablet (5 mg total) by mouth 2 (two) times daily. 04/19/19 04/18/20  [name], MD  metFORMIN (GLUCOPHAGE-XR) 500 MG 24 hr tablet Take 500 mg by mouth daily with breakfast. Take 2 tablets in the morning and at night.  [provider]  Multiple Vitamin (MULTIVITAMIN WITH MINERALS) TABS tablet Take 1 tablet by mouth daily.    [provider]  omega-3 acid ethyl esters (LOVAZA) 1 g capsule Take 1 g by mouth 2 (two) times daily.    [provider]  rOPINIRole (REQUIP) 0.5 MG tablet Take 1 tablet (0.5 mg total) by mouth at bedtime. 04/19/19   [name], MD  sacubitril-valsartan (ENTRESTO) 24-26 MG Take 1 tablet by mouth 2 (two) times daily. 04/30/19    [name], FNP  tamsulosin (FLOMAX) 0.4 MG CAPS capsule Take 1 capsule (0.4 mg total) by mouth daily. 04/23/19   [name], MD  tiotropium (SPIRIVA) 18 MCG inhalation capsule Place 1 capsule (18 mcg total) into inhaler and inhale daily. 04/23/19   [name], MD    Allergies Patient has no known allergies.  Family Hx Family History  Problem Relation Age of Onset  . Cancer Brother     Social Hx Social History   Tobacco Use  . Smoking status: Former Smoker    Types: Cigarettes  . Smokeless tobacco: Never Used  . Tobacco comment: Pt states he quit 1 week ago  Substance Use Topics  . Alcohol use: Not Currently  . Drug use: Not Currently     Review of Systems  Constitutional: Negative for fever. Negative for chills. Eyes: Negative for visual changes. ENT: Negative for sore throat. Cardiovascular: Negative for chest pain. Respiratory: Positive for shortness of breath. Gastrointestinal: Negative for nausea. Negative for vomiting.  Genitourinary: Negative for dysuria. Musculoskeletal: Positive for leg swelling. Skin: Negative for rash. Neurological: Negative for headaches.   Physical Exam  Vital Signs: ED Triage Vitals  Enc Vitals Group     BP 06/27/19 1049 (!) 165/98     Pulse Rate 06/27/19 1049 76     Resp 06/27/19 1049 16     Temp 06/27/19 1049 98 F (36.7 C)     Temp Source 06/27/19 1049 Oral     SpO2 06/27/19 1049 98 %     Weight 06/27/19 1050 233 lb (105.7 kg)     Height 06/27/19 1050 6\' 1"  (1.854 m)     Head Circumference --      Peak Flow --      Pain Score 06/27/19 1049 1     Pain Loc --      Pain Edu? --      Excl. in [place]? --     Constitutional: Alert and oriented.  Appears older than stated age. NAD.  Head: Normocephalic. Atraumatic. Eyes: Conjunctivae clear. Sclera anicteric. Pupils equal and symmetric. Nose: No masses or lesions. No congestion or rhinorrhea. Mouth/Throat: Wearing mask.  Neck: No stridor. Trachea midline.    Cardiovascular: Normal rate, regular rhythm. Extremities well perfused. Respiratory: Normal respiratory effort.  Decreased/coarse sounds lungs at the base, otherwise clear.  No hypoxia. Gastrointestinal: Soft. Non-distended. Non-tender.  Genitourinary: Deferred. Musculoskeletal: Bilateral 3+ pitting edema to proximal shins.  Neurologic:  Normal speech and language. No gross focal or lateralizing neurologic deficits are appreciated.  Skin: Skin is warm, dry and intact. No rash noted. Psychiatric: Mood and affect are appropriate for situation.  EKG  Personally reviewed and interpreted by myself.   Date: 06/27/19 Time: 1049 Rate: 76 Rhythm: sinus Axis: normal Intervals: WNL No acute ischemic changes No STEMI    Radiology  Personally reviewed available imaging myself.   CXR - IMPRESSION:  Cardiomegaly and mild interstitial edema.    Procedures  Procedure(s) performed (including critical  care):  Procedures   Initial Impression / Assessment and Plan / MDM / ED Course  63 y.o. male with hx of HF, idiopathic cardiomyopathy (EF 30-35%), CKD who presents from clinic w/ concern for HF exacerbation with failed outpatient management (including several doses of IV Lasix in clinic). Sent from HF clinic for admission for IV diuresis.   Ddx: HF exacerbation, decrease sensitivity to Lasix, superimposed CKD, less likely COPD exacerbation based on exam/history  Will plan for labs, EKG, CXR  Clinical Course as of Jun 26 1520  Thu Jun 27, 2019  1336 Labs reveal creatinine 1.76.  BNP elevated at 3153.  High-sensitivity troponin mildly elevated at 31, likely secondary to demand.  EKG without acute ischemic changes.  CXR notable for cardiomegaly with interstitial edema.  We will proceed with admission for IV diuresis as planned.  Will give dose of IV Lasix while in the ED.   [SM]    Clinical Course User Index [SM] Lilia Pro., MD     _______________________________   As part  of my medical decision making I have reviewed available labs, radiology tests, reviewed old records/performed chart review.    Final Clinical Impression(s) / ED Diagnosis  Final diagnoses:  SOB (shortness of breath)  Acute on chronic combined systolic and diastolic congestive heart failure ([place])       Note:  This document was prepared using Dragon voice recognition software and may include unintentional dictation errors.   Lilia Pro., MD 06/27/19 [id number]

## 2019-06-27 NOTE — ED Notes (Signed)
Attempted to call report on pt but was placed on hold

## 2019-06-27 NOTE — Telephone Encounter (Signed)
Called patient regarding lab results from yesterday. BNP is rising and renal function is worsening. Patient says that he feels "little better" from yesterday but he's getting concerned that he's not improving more.   Had talked with patient's cardiologist Ubaldo Glassing) yesterday about patient and plan for him. Explained to patient that we felt it was safest for patient to come to the ED for possible admission due to failure of outpatient diuresis (has received IV lasix twice this week).   Patient is in agreement with this plan and says that he will have someone drive him to the ED. ED triage nurse informed that patient would be coming.

## 2019-06-27 NOTE — ED Notes (Signed)
Ready bed @ 1453. Patient going to room 241. Spoke with RN Janett Billow.

## 2019-06-27 NOTE — Progress Notes (Signed)
Pts ReDs vest reading is 53.

## 2019-06-28 LAB — BASIC METABOLIC PANEL
Anion gap: 9 (ref 5–15)
BUN: 30 mg/dL — ABNORMAL HIGH (ref 8–23)
CO2: 24 mmol/L (ref 22–32)
Calcium: 8.6 mg/dL — ABNORMAL LOW (ref 8.9–10.3)
Chloride: 106 mmol/L (ref 98–111)
Creatinine, Ser: 1.76 mg/dL — ABNORMAL HIGH (ref 0.61–1.24)
GFR calc Af Amer: 46 mL/min — ABNORMAL LOW (ref >60)
GFR calc non Af Amer: 40 mL/min — ABNORMAL LOW (ref >60)
Glucose, Bld: 126 mg/dL — ABNORMAL HIGH (ref 70–99)
Potassium: 4.4 mmol/L (ref 3.5–5.1)
Sodium: 139 mmol/L (ref 135–145)

## 2019-06-28 LAB — GLUCOSE, CAPILLARY
Glucose-Capillary: 172 mg/dL — ABNORMAL HIGH (ref 70–99)
Glucose-Capillary: 189 mg/dL — ABNORMAL HIGH (ref 70–99)

## 2019-06-28 LAB — MAGNESIUM: Magnesium: 1.7 mg/dL (ref 1.7–2.4)

## 2019-06-28 MED ORDER — GUAIFENESIN-DM 100-10 MG/5ML PO SYRP
5.0000 mL | ORAL_SOLUTION | ORAL | Status: DC | PRN
  Administered 2019-06-28: 5 mL via ORAL
  Filled 2019-06-28: qty 5

## 2019-06-28 MED ORDER — MAGNESIUM OXIDE 400 (241.3 MG) MG PO TABS
800.0000 mg | ORAL_TABLET | Freq: Two times a day (BID) | ORAL | Status: DC
  Administered 2019-06-28 – 2019-07-03 (×10): 800 mg via ORAL
  Filled 2019-06-28 (×10): qty 2

## 2019-06-28 NOTE — Plan of Care (Signed)
Per pt has watched Emmi videos the last visit he was here , refused to watch them at this time. Pt complains of pain when taking a breath, n/c applied for comfort. Problem: Health Behavior/Discharge Planning: Goal: Ability to manage health-related needs will improve Outcome: Progressing   Problem: Clinical Measurements: Goal: Will remain free from infection Outcome: Progressing

## 2019-06-28 NOTE — Plan of Care (Signed)
  Problem: Clinical Measurements: Goal: Ability to maintain clinical measurements within normal limits will improve Outcome: Progressing   

## 2019-06-28 NOTE — Plan of Care (Signed)
  Problem: Education: Goal: Knowledge of General Education information will improve Description: Including pain rating scale, medication(s)/side effects and non-pharmacologic comfort measures Outcome: Progressing   Problem: Education: Goal: Ability to verbalize understanding of medication therapies will improve Outcome: Progressing   

## 2019-06-28 NOTE — Progress Notes (Signed)
Progress Note    Shawn Hayes  MQK:[id number] DOB: 28-Apr-1955  DOA: 06/27/2019 PCP: [name], MD      Brief Narrative:    Medical records reviewed and are as summarized below:  Shawn Hayes is an 64 y.o. male with medical history significant for type 2 diabetes mellitus, hypertension, CKD stage IIIb, chronic systolic and diastolic CHF, nonischemic cardiomyopathy, BPH, presented to the hospital because of shortness of breath.  He said he has been having shortness of breath for about a month now.  This has been associated with increasing bilateral leg swelling.  He had been taking his Lasix at home without any improvement.  He even went to the heart failure clinic and he was given IV Lasix for 2 consecutive days in the 2 days preceding admission but his symptoms did not improve.  He came to the emergency room at the behest of his nurse practitioner from cardiology office.      Assessment/Plan:   Principal Problem:   Acute on chronic combined systolic and diastolic CHF (congestive heart failure) (HCC) Active Problems:   CKD stage 3 due to type 2 diabetes mellitus (HCC)  Acute on chronic systolic and diastolic CHF: He failed outpatient IV and oral Lasix.    Continue IV Lasix infusion.  Continue Entresto and Coreg.  Monitor BMP, daily weights and urine output.  Chart review showed that 2D echo in February 2021 revealed EF estimated at 30 to 65%, grade 1 diastolic dysfunction moderately elevated pulmonary artery systolic pressure.  Mildly elevated troponin: Troponin of 31.  Chart review shows that on April 17, 2019, troponins were 27 and 29.  Elevated troponin is probably due to underlying CKD versus CHF.  CAD stage IIIb: Creatinine is around baseline.  Monitor BMP closely.  Type II DM: Hold Metformin and glipizide.  NovoLog as needed for hyperglycemia.  Hypertension: Continue antihypertensives.    Body mass index is 29.76 kg/m.   Family  Communication/Anticipated D/C date and plan/Code Status   DVT prophylaxis: Heparin Code Status: Full code Family Communication: Plan discussed with patient Disposition Plan:    Status is: Inpatient  Remains inpatient appropriate because:IV treatments appropriate due to intensity of illness or inability to take PO and Inpatient level of care appropriate due to severity of illness   Dispo: The patient is from: Home              Anticipated d/c is to: Home              Anticipated d/c date is: 2 days              Patient currently is not medically stable to d/c.            Subjective:   Breathing is a little better.  He still has significant leg swelling.  Objective:    Vitals:   06/28/19 0617 06/28/19 0736 06/28/19 1130 06/28/19 1607  BP: (!) 143/95 (!) 148/98 131/67 (!) 151/95  Pulse: 69 68 61 60  Resp: 19 18 17 18   Temp: (!) 97.5 F (36.4 C) 98.5 F (36.9 C) 98.2 F (36.8 C) 98 F (36.7 C)  TempSrc: Oral Oral Oral   SpO2: 96% 99% 100% 99%  Weight: 102.3 kg     Height:       No data found.   Intake/Output Summary (Last 24 hours) at 06/28/2019 1620 Last data filed at 06/28/2019 1605 Gross per 24 hour  Intake 447.21 ml  Output 2150 ml  Net -1702.79 ml   Filed Weights   06/27/19 1050 06/27/19 1544 06/28/19 0617  Weight: 105.7 kg 102.8 kg 102.3 kg    Exam:  GEN: NAD SKIN: No rash EYES: EOMI ENT: MMM CV: RRR PULM: CTA B ABD: soft, obese, NT, +BS CNS: AAO x 3, non focal EXT: Significant bilateral leg edema, no tenderness   Data Reviewed:   I have personally reviewed following labs and imaging studies:  Labs: Labs show the following:   Basic Metabolic Panel: Recent Labs  Lab 06/25/19 1353 06/25/19 1353 06/26/19 1056 06/26/19 1056 06/27/19 1059 06/28/19 0506  NA 139  --  140  --  140 139  K 4.3   < > 4.4   < > 4.3 4.4  CL 110  --  111  --  109 106  CO2 23  --  23  --  24 24  GLUCOSE 240*  --  145*  --  141* 126*  BUN 23  --  26*   --  28* 30*  CREATININE 1.77*  --  1.89*  --  1.76* 1.76*  CALCIUM 8.1*  --  8.1*  --  8.8* 8.6*  MG  --   --   --   --   --  1.7   < > = values in this interval not displayed.   GFR Estimated Creatinine Clearance: 52.6 mL/min (A) (by C-G formula based on SCr of 1.76 mg/dL (H)). Liver Function Tests: Recent Labs  Lab 06/27/19 1059  AST 15  ALT 12  ALKPHOS 94  BILITOT 0.9  PROT 7.4  ALBUMIN 3.3*   No results for input(s): LIPASE, AMYLASE in the last 168 hours. No results for input(s): AMMONIA in the last 168 hours. Coagulation profile No results for input(s): INR, PROTIME in the last 168 hours.  CBC: Recent Labs  Lab 06/27/19 1059  WBC 6.1  NEUTROABS 3.5  HGB 10.1*  HCT 32.2*  MCV 89.9  PLT 207   Cardiac Enzymes: No results for input(s): CKTOTAL, CKMB, CKMBINDEX, TROPONINI in the last 168 hours. BNP (last 3 results) No results for input(s): PROBNP in the last 8760 hours. CBG: Recent Labs  Lab 06/26/19 1004 06/27/19 1642 06/27/19 2125 06/28/19 1130 06/28/19 1618  GLUCAP 150* 108* 177* 172* 189*   D-Dimer: No results for input(s): DDIMER in the last 72 hours. Hgb A1c: No results for input(s): HGBA1C in the last 72 hours. Lipid Profile: No results for input(s): CHOL, HDL, LDLCALC, TRIG, CHOLHDL, LDLDIRECT in the last 72 hours. Thyroid function studies: No results for input(s): TSH, T4TOTAL, T3FREE, THYROIDAB in the last 72 hours.  Invalid input(s): FREET3 Anemia work up: No results for input(s): VITAMINB12, FOLATE, FERRITIN, TIBC, IRON, RETICCTPCT in the last 72 hours. Sepsis Labs: Recent Labs  Lab 06/27/19 1059  WBC 6.1    Microbiology Recent Results (from the past 240 hour(s))  Respiratory Panel by RT PCR (Flu A&B, Covid) - Nasopharyngeal Swab     Status: None   Collection Time: 06/27/19  2:43 PM   Specimen: Nasopharyngeal Swab  Result Value Ref Range Status   SARS Coronavirus 2 by RT PCR NEGATIVE NEGATIVE Final    Comment: (NOTE) SARS-CoV-2  target nucleic acids are NOT DETECTED. The SARS-CoV-2 RNA is generally detectable in upper respiratoy specimens during the acute phase of infection. The lowest concentration of SARS-CoV-2 viral copies this assay can detect is 131 copies/mL. A negative result does not preclude SARS-Cov-2 infection and should not be  used as the sole basis for treatment or other patient management decisions. A negative result may occur with  improper specimen collection/handling, submission of specimen other than nasopharyngeal swab, presence of viral mutation(s) within the areas targeted by this assay, and inadequate number of viral copies (<131 copies/mL). A negative result must be combined with clinical observations, patient history, and epidemiological information. The expected result is Negative. Fact Sheet for Patients:  PinkCheek.be Fact Sheet for Healthcare Providers:  GravelBags.it This test is not yet ap proved or cleared by the [place] FDA and  has been authorized for detection and/or diagnosis of SARS-CoV-2 by FDA under an Emergency Use Authorization (EUA). This EUA will remain  in effect (meaning this test can be used) for the duration of the COVID-19 declaration under Section 564(b)(1) of the Act, 21 U.S.C. section 360bbb-3(b)(1), unless the authorization is terminated or revoked sooner.    Influenza A by PCR NEGATIVE NEGATIVE Final   Influenza B by PCR NEGATIVE NEGATIVE Final    Comment: (NOTE) The Xpert Xpress SARS-CoV-2/FLU/RSV assay is intended as an aid in  the diagnosis of influenza from Nasopharyngeal swab specimens and  should not be used as a sole basis for treatment. Nasal washings and  aspirates are unacceptable for Xpert Xpress SARS-CoV-2/FLU/RSV  testing. Fact Sheet for Patients: PinkCheek.be Fact Sheet for Healthcare Providers: GravelBags.it This test is  not yet approved or cleared by the [place] FDA and  has been authorized for detection and/or diagnosis of SARS-CoV-2 by  FDA under an Emergency Use Authorization (EUA). This EUA will remain  in effect (meaning this test can be used) for the duration of the  Covid-19 declaration under Section 564(b)(1) of the Act, 21  U.S.C. section 360bbb-3(b)(1), unless the authorization is  terminated or revoked. Performed at [facility], [address] [place], [place] [address]     Procedures and diagnostic studies:  DG Chest 2 View  Result Date: 06/27/2019 CLINICAL DATA:  Elevated BNP and renal function labs. Failed diuresis. Shortness of breath. EXAM: CHEST - 2 VIEW COMPARISON:  04/17/2019 FINDINGS: Heart is enlarged. There is pulmonary vascular congestion. Mild interstitial edema is also present. No overt alveolar edema. No consolidations or pleural effusions. IMPRESSION: Cardiomegaly and mild interstitial edema. Electronically Signed   By: Nolon Nations M.D.   On: 06/27/2019 13:21    Medications:   . carvedilol  25 mg Oral BID WC  . heparin  5,000 Units Subcutaneous Q8H  . insulin aspart  0-6 Units Subcutaneous TID WC  . mometasone-formoterol  2 puff Inhalation BID  . omega-3 acid ethyl esters  1 g Oral BID  . rOPINIRole  0.5 mg Oral QHS  . sacubitril-valsartan  1 tablet Oral BID  . sodium chloride flush  3 mL Intravenous Q12H  . tamsulosin  0.4 mg Oral Daily  . tiotropium  18 mcg Inhalation Daily   Continuous Infusions: . sodium chloride    . furosemide (LASIX) infusion 4 mg/hr (06/28/19 1500)     LOS: 1 day   Lauran Romanski  [facility]     06/28/2019, 4:20 PM

## 2019-06-29 LAB — BASIC METABOLIC PANEL
Anion gap: 8 (ref 5–15)
BUN: 34 mg/dL — ABNORMAL HIGH (ref 8–23)
CO2: 25 mmol/L (ref 22–32)
Calcium: 8.5 mg/dL — ABNORMAL LOW (ref 8.9–10.3)
Chloride: 105 mmol/L (ref 98–111)
Creatinine, Ser: 1.81 mg/dL — ABNORMAL HIGH (ref 0.61–1.24)
GFR calc Af Amer: 44 mL/min — ABNORMAL LOW (ref >60)
GFR calc non Af Amer: 38 mL/min — ABNORMAL LOW (ref >60)
Glucose, Bld: 136 mg/dL — ABNORMAL HIGH (ref 70–99)
Potassium: 4.6 mmol/L (ref 3.5–5.1)
Sodium: 138 mmol/L (ref 135–145)

## 2019-06-29 LAB — GLUCOSE, CAPILLARY
Glucose-Capillary: 124 mg/dL — ABNORMAL HIGH (ref 70–99)
Glucose-Capillary: 171 mg/dL — ABNORMAL HIGH (ref 70–99)
Glucose-Capillary: 140 mg/dL — ABNORMAL HIGH (ref 70–99)
Glucose-Capillary: 231 mg/dL — ABNORMAL HIGH (ref 70–99)

## 2019-06-29 LAB — MAGNESIUM: Magnesium: 1.8 mg/dL (ref 1.7–2.4)

## 2019-06-29 NOTE — Plan of Care (Signed)
  Problem: Health Behavior/Discharge Planning: Goal: Ability to manage health-related needs will improve Outcome: Progressing   Problem: Clinical Measurements: Goal: Respiratory complications will improve Outcome: Progressing   Problem: Activity: Goal: Risk for activity intolerance will decrease Outcome: Progressing   Problem: Education: Goal: Ability to verbalize understanding of medication therapies will improve Outcome: Progressing

## 2019-06-29 NOTE — Progress Notes (Signed)
Progress Note    Shawn Hayes  AOZ:[id number] DOB: May 05, 1955  DOA: 06/27/2019 PCP: [name], MD      Brief Narrative:    Medical records reviewed and are as summarized below:  Shawn Hayes is an 64 y.o. male with medical history significant for type 2 diabetes mellitus, hypertension, CKD stage IIIb, chronic systolic and diastolic CHF, nonischemic cardiomyopathy, BPH, presented to the hospital because of shortness of breath.  He said he has been having shortness of breath for about a month now.  This has been associated with increasing bilateral leg swelling.  He had been taking his Lasix at home without any improvement.  He even went to the heart failure clinic and he was given IV Lasix for 2 consecutive days in the 2 days preceding admission but his symptoms did not improve.  He came to the emergency room at the behest of his nurse practitioner from cardiology office.      Assessment/Plan:   Principal Problem:   Acute on chronic combined systolic and diastolic CHF (congestive heart failure) (HCC) Active Problems:   CKD stage 3 due to type 2 diabetes mellitus (HCC)  Acute on chronic systolic and diastolic CHF: He failed outpatient IV and oral Lasix.    Continue IV Lasix infusion.  Continue Entresto and Coreg.  Monitor BMP, daily weights and urine output.  Chart review showed that 2D echo in February 2021 revealed EF estimated at 30 to 96%, grade 1 diastolic dysfunction moderately elevated pulmonary artery systolic pressure.  Encouraged ambulation.  Mildly elevated troponin: Troponin of 31.  Chart review shows that on April 17, 2019, troponins were 27 and 29.  Elevated troponin is probably due to underlying CKD versus CHF.  CAD stage IIIb: Creatinine is around baseline.  Monitor BMP closely.  Type II DM: Hold Metformin and glipizide.  NovoLog as needed for hyperglycemia.  Hypertension: Continue antihypertensives.  Watery stools: Imodium as needed  if watery stools persists.  Body mass index is 29.42 kg/m.   Family Communication/Anticipated D/C date and plan/Code Status   DVT prophylaxis: Heparin Code Status: Full code Family Communication: Plan discussed with patient Disposition Plan:    Status is: Inpatient  Remains inpatient appropriate because:IV treatments appropriate due to intensity of illness or inability to take PO and Inpatient level of care appropriate due to severity of illness   Dispo: The patient is from: Home              Anticipated d/c is to: Home              Anticipated d/c date is: 2 days              Patient currently is not medically stable to d/c.            Subjective:   He complains of fatigue and generalized weakness.  He said he had 2 watery stools this morning.  He said he has been dealing with intermittent diarrhea over a month and he attributes this to the multiple medications he takes.  He is short of breath with exertion but he thinks it is improving.  No chest pain  Objective:    Vitals:   06/29/19 0824 06/29/19 0900 06/29/19 1203 06/29/19 1620  BP: (!) 151/103 113/69 (!) 147/95 (!) 140/96  Pulse: 62 75 (!) 57 (!) 56  Resp:   17 17  Temp:  98.4 F (36.9 C) 97.7 F (36.5 C) 97.9 F (36.6 C)  TempSrc:  Oral    SpO2: 100% 97% 96% 100%  Weight:      Height:       No data found.   Intake/Output Summary (Last 24 hours) at 06/29/2019 1635 Last data filed at 06/29/2019 1620 Gross per 24 hour  Intake 278.1 ml  Output 1800 ml  Net -1521.9 ml   Filed Weights   06/27/19 1544 06/28/19 0617 06/29/19 0307  Weight: 102.8 kg 102.3 kg 101.2 kg    Exam:  GEN: NAD SKIN: No rash EYES: EOMI ENT: MMM CV: RRR PULM: No rales or wheezing heard ABD: soft, obese, NT, +BS CNS: AAO x 3, non focal EXT: Significant bilateral leg edema but slowly improving, no tenderness   Data Reviewed:   I have personally reviewed following labs and imaging studies:  Labs: Labs show the  following:   Basic Metabolic Panel: Recent Labs  Lab 06/25/19 1353 06/25/19 1353 06/26/19 1056 06/26/19 1056 06/27/19 1059 06/27/19 1059 06/28/19 0506 06/29/19 0533  NA 139  --  140  --  140  --  139 138  K 4.3   < > 4.4   < > 4.3   < > 4.4 4.6  CL 110  --  111  --  109  --  106 105  CO2 23  --  23  --  24  --  24 25  GLUCOSE 240*  --  145*  --  141*  --  126* 136*  BUN 23  --  26*  --  28*  --  30* 34*  CREATININE 1.77*  --  1.89*  --  1.76*  --  1.76* 1.81*  CALCIUM 8.1*  --  8.1*  --  8.8*  --  8.6* 8.5*  MG  --   --   --   --   --   --  1.7 1.8   < > = values in this interval not displayed.   GFR Estimated Creatinine Clearance: 50.9 mL/min (A) (by C-G formula based on SCr of 1.81 mg/dL (H)). Liver Function Tests: Recent Labs  Lab 06/27/19 1059  AST 15  ALT 12  ALKPHOS 94  BILITOT 0.9  PROT 7.4  ALBUMIN 3.3*   No results for input(s): LIPASE, AMYLASE in the last 168 hours. No results for input(s): AMMONIA in the last 168 hours. Coagulation profile No results for input(s): INR, PROTIME in the last 168 hours.  CBC: Recent Labs  Lab 06/27/19 1059  WBC 6.1  NEUTROABS 3.5  HGB 10.1*  HCT 32.2*  MCV 89.9  PLT 207   Cardiac Enzymes: No results for input(s): CKTOTAL, CKMB, CKMBINDEX, TROPONINI in the last 168 hours. BNP (last 3 results) No results for input(s): PROBNP in the last 8760 hours. CBG: Recent Labs  Lab 06/28/19 1130 06/28/19 1618 06/29/19 0729 06/29/19 1202 06/29/19 1620  GLUCAP 172* 189* 124* 171* 140*   D-Dimer: No results for input(s): DDIMER in the last 72 hours. Hgb A1c: No results for input(s): HGBA1C in the last 72 hours. Lipid Profile: No results for input(s): CHOL, HDL, LDLCALC, TRIG, CHOLHDL, LDLDIRECT in the last 72 hours. Thyroid function studies: No results for input(s): TSH, T4TOTAL, T3FREE, THYROIDAB in the last 72 hours.  Invalid input(s): FREET3 Anemia work up: No results for input(s): VITAMINB12, FOLATE, FERRITIN,  TIBC, IRON, RETICCTPCT in the last 72 hours. Sepsis Labs: Recent Labs  Lab 06/27/19 1059  WBC 6.1    Microbiology Recent Results (from the past 240 hour(s))  Respiratory  Panel by RT PCR (Flu A&B, Covid) - Nasopharyngeal Swab     Status: None   Collection Time: 06/27/19  2:43 PM   Specimen: Nasopharyngeal Swab  Result Value Ref Range Status   SARS Coronavirus 2 by RT PCR NEGATIVE NEGATIVE Final    Comment: (NOTE) SARS-CoV-2 target nucleic acids are NOT DETECTED. The SARS-CoV-2 RNA is generally detectable in upper respiratoy specimens during the acute phase of infection. The lowest concentration of SARS-CoV-2 viral copies this assay can detect is 131 copies/mL. A negative result does not preclude SARS-Cov-2 infection and should not be used as the sole basis for treatment or other patient management decisions. A negative result may occur with  improper specimen collection/handling, submission of specimen other than nasopharyngeal swab, presence of viral mutation(s) within the areas targeted by this assay, and inadequate number of viral copies (<131 copies/mL). A negative result must be combined with clinical observations, patient history, and epidemiological information. The expected result is Negative. Fact Sheet for Patients:  PinkCheek.be Fact Sheet for Healthcare Providers:  GravelBags.it This test is not yet ap proved or cleared by the [place] FDA and  has been authorized for detection and/or diagnosis of SARS-CoV-2 by FDA under an Emergency Use Authorization (EUA). This EUA will remain  in effect (meaning this test can be used) for the duration of the COVID-19 declaration under Section 564(b)(1) of the Act, 21 U.S.C. section 360bbb-3(b)(1), unless the authorization is terminated or revoked sooner.    Influenza A by PCR NEGATIVE NEGATIVE Final   Influenza B by PCR NEGATIVE NEGATIVE Final    Comment:  (NOTE) The Xpert Xpress SARS-CoV-2/FLU/RSV assay is intended as an aid in  the diagnosis of influenza from Nasopharyngeal swab specimens and  should not be used as a sole basis for treatment. Nasal washings and  aspirates are unacceptable for Xpert Xpress SARS-CoV-2/FLU/RSV  testing. Fact Sheet for Patients: PinkCheek.be Fact Sheet for Healthcare Providers: GravelBags.it This test is not yet approved or cleared by the [place] FDA and  has been authorized for detection and/or diagnosis of SARS-CoV-2 by  FDA under an Emergency Use Authorization (EUA). This EUA will remain  in effect (meaning this test can be used) for the duration of the  Covid-19 declaration under Section 564(b)(1) of the Act, 21  U.S.C. section 360bbb-3(b)(1), unless the authorization is  terminated or revoked. Performed at [facility], [address] [place], [place] [address]     Procedures and diagnostic studies:  No results found.  Medications:   . carvedilol  25 mg Oral BID WC  . heparin  5,000 Units Subcutaneous Q8H  . insulin aspart  0-6 Units Subcutaneous TID WC  . magnesium oxide  800 mg Oral BID  . mometasone-formoterol  2 puff Inhalation BID  . omega-3 acid ethyl esters  1 g Oral BID  . rOPINIRole  0.5 mg Oral QHS  . sacubitril-valsartan  1 tablet Oral BID  . sodium chloride flush  3 mL Intravenous Q12H  . tamsulosin  0.4 mg Oral Daily  . tiotropium  18 mcg Inhalation Daily   Continuous Infusions: . sodium chloride    . furosemide (LASIX) infusion 4 mg/hr (06/28/19 1500)     LOS: 2 days   Romano Stigger  [facility]     06/29/2019, 4:35 PM

## 2019-06-30 LAB — BASIC METABOLIC PANEL
Anion gap: 8 (ref 5–15)
BUN: 41 mg/dL — ABNORMAL HIGH (ref 8–23)
CO2: 25 mmol/L (ref 22–32)
Calcium: 8.7 mg/dL — ABNORMAL LOW (ref 8.9–10.3)
Chloride: 103 mmol/L (ref 98–111)
Creatinine, Ser: 1.89 mg/dL — ABNORMAL HIGH (ref 0.61–1.24)
GFR calc Af Amer: 42 mL/min — ABNORMAL LOW (ref >60)
GFR calc non Af Amer: 36 mL/min — ABNORMAL LOW (ref >60)
Glucose, Bld: 147 mg/dL — ABNORMAL HIGH (ref 70–99)
Potassium: 4.4 mmol/L (ref 3.5–5.1)
Sodium: 136 mmol/L (ref 135–145)

## 2019-06-30 LAB — GLUCOSE, CAPILLARY
Glucose-Capillary: 137 mg/dL — ABNORMAL HIGH (ref 70–99)
Glucose-Capillary: 207 mg/dL — ABNORMAL HIGH (ref 70–99)
Glucose-Capillary: 145 mg/dL — ABNORMAL HIGH (ref 70–99)
Glucose-Capillary: 243 mg/dL — ABNORMAL HIGH (ref 70–99)

## 2019-06-30 LAB — MAGNESIUM: Magnesium: 2 mg/dL (ref 1.7–2.4)

## 2019-06-30 MED ORDER — LOPERAMIDE HCL 2 MG PO CAPS
2.0000 mg | ORAL_CAPSULE | ORAL | Status: DC | PRN
  Administered 2019-06-30: 2 mg via ORAL
  Filled 2019-06-30: qty 1

## 2019-06-30 NOTE — Progress Notes (Addendum)
Pt refused bed alarm but was educated about safety. Will continue to monitor.  Update 0143: Pt complaints of itching. Notify Ouma NP. Will continue to monitor.  Update 0143: Ouma NP ordered Diphenhydramine capsule 25 mg every 6 hours PRN for itching. Will continue to monitor.

## 2019-06-30 NOTE — Progress Notes (Signed)
Progress Note    Shawn Hayes  JFH:[id number] DOB: 12/15/1955  DOA: 06/27/2019 PCP: [name], MD      Brief Narrative:    Medical records reviewed and are as summarized below:  Shawn Hayes is an 64 y.o. male with medical history significant for type 2 diabetes mellitus, hypertension, CKD stage IIIb, chronic systolic and diastolic CHF, nonischemic cardiomyopathy, BPH, presented to the hospital because of shortness of breath.  He said he has been having shortness of breath for about a month now.  This has been associated with increasing bilateral leg swelling.  He had been taking his Lasix at home without any improvement.  He even went to the heart failure clinic and he was given IV Lasix for 2 consecutive days in the 2 days preceding admission but his symptoms did not improve.  He came to the emergency room at the behest of his nurse practitioner from cardiology office.      Assessment/Plan:   Principal Problem:   Acute on chronic combined systolic and diastolic CHF (congestive heart failure) (HCC) Active Problems:   CKD stage 3 due to type 2 diabetes mellitus (HCC)  Acute on chronic systolic and diastolic CHF: He failed outpatient IV and oral Lasix.    Continue IV Lasix infusion.  Continue Entresto and Coreg.  Monitor BMP, daily weights and urine output.  Chart review showed that 2D echo in February 2021 revealed EF estimated at 30 to 93%, grade 1 diastolic dysfunction moderately elevated pulmonary artery systolic pressure.  Encouraged ambulation.  Mildly elevated troponin: Troponin of 31.  Chart review shows that on April 17, 2019, troponins were 27 and 29.  Elevated troponin is probably due to underlying CKD versus CHF.  Sinus bradycardia: Asymptomatic.  He is on carvedilol.  CAD stage IIIb: Creatinine is around baseline.  Monitor BMP closely.  Type II DM: Hold Metformin and glipizide.  NovoLog as needed for hyperglycemia.  Hypertension:  Continue antihypertensives.  Watery stools: Imodium as needed for watery stools.  Body mass index is 29.03 kg/m.   Family Communication/Anticipated D/C date and plan/Code Status   DVT prophylaxis: Heparin Code Status: Full code Family Communication: Plan discussed with patient Disposition Plan:    Status is: Inpatient  Remains inpatient appropriate because:IV treatments appropriate due to intensity of illness or inability to take PO and Inpatient level of care appropriate due to severity of illness   Dispo: The patient is from: Home              Anticipated d/c is to: Home              Anticipated d/c date is: 2 days              Patient currently is not medically stable to d/c.            Subjective:   Overall, he feels better today.  He had small amount of 2 loose stools today.  She reports some abdominal cramps.  No vomiting, shortness of breath or chest pain.  He can feel that leg swelling is better.  Objective:    Vitals:   06/30/19 0530 06/30/19 0757 06/30/19 1138 06/30/19 1505  BP:  (!) 146/93 140/83 135/85  Pulse:  (!) 58 (!) 52 (!) 54  Resp:  18 17 17   Temp: 97.9 F (36.6 C) 97.6 F (36.4 C) (!) 97.5 F (36.4 C) 97.6 F (36.4 C)  TempSrc: Oral Oral Oral Oral  SpO2:  98% 100% 97%  Weight:      Height:       No data found.   Intake/Output Summary (Last 24 hours) at 06/30/2019 1528 Last data filed at 06/30/2019 1443 Gross per 24 hour  Intake 804 ml  Output 1825 ml  Net -1021 ml   Filed Weights   06/28/19 0617 06/29/19 0307 06/30/19 0503  Weight: 102.3 kg 101.2 kg 99.8 kg    Exam:  GEN: No acute distress, sitting up in the chair SKIN: No rash EYES: EOMI ENT: MMM CV: RRR PULM: No rales or wheezing heard ABD: soft, obese, NT, +BS CNS: AAO x 3, non focal EXT:  bilateral leg edema is improving (upper leg swelling is better), no tenderness   Data Reviewed:   I have personally reviewed following labs and imaging  studies:  Labs: Labs show the following:   Basic Metabolic Panel: Recent Labs  Lab 06/26/19 1056 06/26/19 1056 06/27/19 1059 06/27/19 1059 06/28/19 0506 06/28/19 0506 06/29/19 0533 06/30/19 0614  NA 140  --  140  --  139  --  138 136  K 4.4   < > 4.3   < > 4.4   < > 4.6 4.4  CL 111  --  109  --  106  --  105 103  CO2 23  --  24  --  24  --  25 25  GLUCOSE 145*  --  141*  --  126*  --  136* 147*  BUN 26*  --  28*  --  30*  --  34* 41*  CREATININE 1.89*  --  1.76*  --  1.76*  --  1.81* 1.89*  CALCIUM 8.1*  --  8.8*  --  8.6*  --  8.5* 8.7*  MG  --   --   --   --  1.7  --  1.8 2.0   < > = values in this interval not displayed.   GFR Estimated Creatinine Clearance: 48.4 mL/min (A) (by C-G formula based on SCr of 1.89 mg/dL (H)). Liver Function Tests: Recent Labs  Lab 06/27/19 1059  AST 15  ALT 12  ALKPHOS 94  BILITOT 0.9  PROT 7.4  ALBUMIN 3.3*   No results for input(s): LIPASE, AMYLASE in the last 168 hours. No results for input(s): AMMONIA in the last 168 hours. Coagulation profile No results for input(s): INR, PROTIME in the last 168 hours.  CBC: Recent Labs  Lab 06/27/19 1059  WBC 6.1  NEUTROABS 3.5  HGB 10.1*  HCT 32.2*  MCV 89.9  PLT 207   Cardiac Enzymes: No results for input(s): CKTOTAL, CKMB, CKMBINDEX, TROPONINI in the last 168 hours. BNP (last 3 results) No results for input(s): PROBNP in the last 8760 hours. CBG: Recent Labs  Lab 06/29/19 1202 06/29/19 1620 06/29/19 2100 06/30/19 0755 06/30/19 1138  GLUCAP 171* 140* 231* 137* 207*   D-Dimer: No results for input(s): DDIMER in the last 72 hours. Hgb A1c: No results for input(s): HGBA1C in the last 72 hours. Lipid Profile: No results for input(s): CHOL, HDL, LDLCALC, TRIG, CHOLHDL, LDLDIRECT in the last 72 hours. Thyroid function studies: No results for input(s): TSH, T4TOTAL, T3FREE, THYROIDAB in the last 72 hours.  Invalid input(s): FREET3 Anemia work up: No results for  input(s): VITAMINB12, FOLATE, FERRITIN, TIBC, IRON, RETICCTPCT in the last 72 hours. Sepsis Labs: Recent Labs  Lab 06/27/19 1059  WBC 6.1    Microbiology Recent Results (from the past 240 hour(s))  Respiratory Panel by RT PCR (Flu A&B, Covid) - Nasopharyngeal Swab     Status: None   Collection Time: 06/27/19  2:43 PM   Specimen: Nasopharyngeal Swab  Result Value Ref Range Status   SARS Coronavirus 2 by RT PCR NEGATIVE NEGATIVE Final    Comment: (NOTE) SARS-CoV-2 target nucleic acids are NOT DETECTED. The SARS-CoV-2 RNA is generally detectable in upper respiratoy specimens during the acute phase of infection. The lowest concentration of SARS-CoV-2 viral copies this assay can detect is 131 copies/mL. A negative result does not preclude SARS-Cov-2 infection and should not be used as the sole basis for treatment or other patient management decisions. A negative result may occur with  improper specimen collection/handling, submission of specimen other than nasopharyngeal swab, presence of viral mutation(s) within the areas targeted by this assay, and inadequate number of viral copies (<131 copies/mL). A negative result must be combined with clinical observations, patient history, and epidemiological information. The expected result is Negative. Fact Sheet for Patients:  PinkCheek.be Fact Sheet for Healthcare Providers:  GravelBags.it This test is not yet ap proved or cleared by the [place] FDA and  has been authorized for detection and/or diagnosis of SARS-CoV-2 by FDA under an Emergency Use Authorization (EUA). This EUA will remain  in effect (meaning this test can be used) for the duration of the COVID-19 declaration under Section 564(b)(1) of the Act, 21 U.S.C. section 360bbb-3(b)(1), unless the authorization is terminated or revoked sooner.    Influenza A by PCR NEGATIVE NEGATIVE Final   Influenza B by PCR  NEGATIVE NEGATIVE Final    Comment: (NOTE) The Xpert Xpress SARS-CoV-2/FLU/RSV assay is intended as an aid in  the diagnosis of influenza from Nasopharyngeal swab specimens and  should not be used as a sole basis for treatment. Nasal washings and  aspirates are unacceptable for Xpert Xpress SARS-CoV-2/FLU/RSV  testing. Fact Sheet for Patients: PinkCheek.be Fact Sheet for Healthcare Providers: GravelBags.it This test is not yet approved or cleared by the [place] FDA and  has been authorized for detection and/or diagnosis of SARS-CoV-2 by  FDA under an Emergency Use Authorization (EUA). This EUA will remain  in effect (meaning this test can be used) for the duration of the  Covid-19 declaration under Section 564(b)(1) of the Act, 21  U.S.C. section 360bbb-3(b)(1), unless the authorization is  terminated or revoked. Performed at [facility], [address] [place], [place] [address]     Procedures and diagnostic studies:  No results found.  Medications:   . carvedilol  25 mg Oral BID WC  . heparin  5,000 Units Subcutaneous Q8H  . insulin aspart  0-6 Units Subcutaneous TID WC  . magnesium oxide  800 mg Oral BID  . mometasone-formoterol  2 puff Inhalation BID  . omega-3 acid ethyl esters  1 g Oral BID  . rOPINIRole  0.5 mg Oral QHS  . sacubitril-valsartan  1 tablet Oral BID  . sodium chloride flush  3 mL Intravenous Q12H  . tamsulosin  0.4 mg Oral Daily  . tiotropium  18 mcg Inhalation Daily   Continuous Infusions: . sodium chloride    . furosemide (LASIX) infusion 4 mg/hr (06/30/19 0920)     LOS: 3 days   Milayna Rotenberg  [facility]     06/30/2019, 3:28 PM

## 2019-07-01 LAB — MAGNESIUM: Magnesium: 2.1 mg/dL (ref 1.7–2.4)

## 2019-07-01 LAB — GLUCOSE, CAPILLARY
Glucose-Capillary: 125 mg/dL — ABNORMAL HIGH (ref 70–99)
Glucose-Capillary: 175 mg/dL — ABNORMAL HIGH (ref 70–99)
Glucose-Capillary: 199 mg/dL — ABNORMAL HIGH (ref 70–99)
Glucose-Capillary: 167 mg/dL — ABNORMAL HIGH (ref 70–99)

## 2019-07-01 LAB — BASIC METABOLIC PANEL
Anion gap: 9 (ref 5–15)
BUN: 44 mg/dL — ABNORMAL HIGH (ref 8–23)
CO2: 25 mmol/L (ref 22–32)
Calcium: 8.7 mg/dL — ABNORMAL LOW (ref 8.9–10.3)
Chloride: 102 mmol/L (ref 98–111)
Creatinine, Ser: 1.97 mg/dL — ABNORMAL HIGH (ref 0.61–1.24)
GFR calc Af Amer: 40 mL/min — ABNORMAL LOW (ref >60)
GFR calc non Af Amer: 35 mL/min — ABNORMAL LOW (ref >60)
Glucose, Bld: 137 mg/dL — ABNORMAL HIGH (ref 70–99)
Potassium: 4.4 mmol/L (ref 3.5–5.1)
Sodium: 136 mmol/L (ref 135–145)

## 2019-07-01 MED ORDER — DIPHENHYDRAMINE HCL 25 MG PO CAPS
25.0000 mg | ORAL_CAPSULE | Freq: Four times a day (QID) | ORAL | Status: DC | PRN
  Administered 2019-07-01: 25 mg via ORAL
  Filled 2019-07-01: qty 1

## 2019-07-01 NOTE — Care Management Important Message (Signed)
Important Message  Patient Details  Name: Shawn Hayes MRN: [id number] Date of Birth: [birth year]   Medicare Important Message Given:  Yes     Dannette Barbara 07/01/2019, 12:32 PM

## 2019-07-01 NOTE — Progress Notes (Signed)
Progress Note    Shawn Hayes  ZRA:[id number] DOB: 19-Jul-1955  DOA: 06/27/2019 PCP: [name], MD      Brief Narrative:    Medical records reviewed and are as summarized below:  Shawn Hayes is an 64 y.o. male with medical history significant for type 2 diabetes mellitus, hypertension, CKD stage IIIb, chronic systolic and diastolic CHF, nonischemic cardiomyopathy, BPH, presented to the hospital because of shortness of breath.  He said he has been having shortness of breath for about a month now.  This has been associated with increasing bilateral leg swelling.  He had been taking his Lasix at home without any improvement.  He even went to the heart failure clinic and he was given IV Lasix for 2 consecutive days in the 2 days preceding admission but his symptoms did not improve.  He came to the emergency room at the behest of his nurse practitioner from cardiology office.      Assessment/Plan:   Principal Problem:   Acute on chronic combined systolic and diastolic CHF (congestive heart failure) (HCC) Active Problems:   CKD stage 3 due to type 2 diabetes mellitus (HCC)  Acute on chronic systolic and diastolic CHF: He failed outpatient IV and oral Lasix.  Patient has produced 9.5 L of urine and has had cumulative net fluid loss of 6.7 L.  REDS vest reading on 07/01/2019 was 52  which is still high.  Of note, REDS vest reading on admission was 53.  Continue IV Lasix infusion but increase rate from 4 mg/h to 8 mg/h.  Continue Entresto and Coreg.    Monitor BMP, daily weights and urine output.  Chart review showed that 2D echo in February 2021 revealed EF estimated at 30 to 54%, grade 1 diastolic dysfunction moderately elevated pulmonary artery systolic pressure.  Encouraged ambulation.  Mildly elevated troponin: Troponin of 31.  Chart review shows that on April 17, 2019, troponins were 27 and 29.  Elevated troponin is probably due to underlying CKD versus  CHF.  Musculoskeletal chest pain: Analgesics as needed for pain.  Sinus bradycardia: Asymptomatic.  He is on carvedilol.  CAD stage IIIb: Creatinine is around baseline.  Monitor BMP closely.  Type II DM: Hold Metformin and glipizide.  NovoLog as needed for hyperglycemia.  Hypertension: Continue antihypertensives.  Watery stools: Improved.  Imodium as needed for watery stools.  Body mass index is 28.12 kg/m.   Family Communication/Anticipated D/C date and plan/Code Status   DVT prophylaxis: Heparin Code Status: Full code Family Communication: Plan discussed with patient Disposition Plan:    Status is: Inpatient  Remains inpatient appropriate because:IV treatments appropriate due to intensity of illness or inability to take PO and Inpatient level of care appropriate due to severity of illness   Dispo: The patient is from: Home              Anticipated d/c is to: Home              Anticipated d/c date is: 2 days              Patient currently is not medically stable to d/c.            Subjective:   C/o pain on both sides of his chest and is worse when he turns around.  No shortness of breath.  He has noticed that swelling in his legs has improved.  Diarrhea has improved with Imodium.  Objective:    Vitals:  07/01/19 0731 07/01/19 0915 07/01/19 1122 07/01/19 1551  BP: (!) 157/99  139/79 (!) 138/91  Pulse: (!) 56  (!) 55 (!) 53  Resp: 18  18 18   Temp: 98.3 F (36.8 C)  98.4 F (36.9 C) 98.3 F (36.8 C)  TempSrc:      SpO2: 94% 94% 97% 97%  Weight:      Height:       No data found.   Intake/Output Summary (Last 24 hours) at 07/01/2019 1609 Last data filed at 07/01/2019 1128 Gross per 24 hour  Intake 1384.49 ml  Output 3570 ml  Net -2185.51 ml   Filed Weights   06/29/19 0307 06/30/19 0503 07/01/19 0314  Weight: 101.2 kg 99.8 kg 96.7 kg    Exam:  GEN: No acute distress, sitting up in the chair SKIN: No rash EYES: EOMI ENT: MMM CV:  RRR PULM: No rales or wheezing heard ABD: soft, obese, NT, +BS CNS: AAO x 3, non focal EXT:  bilateral leg edema is improving (upper leg swelling is better), no tenderness MSK: Right lateral chest wall tenderness    Data Reviewed:   I have personally reviewed following labs and imaging studies:  Labs: Labs show the following:   Basic Metabolic Panel: Recent Labs  Lab 06/27/19 1059 06/27/19 1059 06/28/19 0506 06/28/19 0506 06/29/19 0533 06/29/19 0533 06/30/19 0614 07/01/19 0637  NA 140  --  139  --  138  --  136 136  K 4.3   < > 4.4   < > 4.6   < > 4.4 4.4  CL 109  --  106  --  105  --  103 102  CO2 24  --  24  --  25  --  25 25  GLUCOSE 141*  --  126*  --  136*  --  147* 137*  BUN 28*  --  30*  --  34*  --  41* 44*  CREATININE 1.76*  --  1.76*  --  1.81*  --  1.89* 1.97*  CALCIUM 8.8*  --  8.6*  --  8.5*  --  8.7* 8.7*  MG  --   --  1.7  --  1.8  --  2.0 2.1   < > = values in this interval not displayed.   GFR Estimated Creatinine Clearance: 45.8 mL/min (A) (by C-G formula based on SCr of 1.97 mg/dL (H)). Liver Function Tests: Recent Labs  Lab 06/27/19 1059  AST 15  ALT 12  ALKPHOS 94  BILITOT 0.9  PROT 7.4  ALBUMIN 3.3*   No results for input(s): LIPASE, AMYLASE in the last 168 hours. No results for input(s): AMMONIA in the last 168 hours. Coagulation profile No results for input(s): INR, PROTIME in the last 168 hours.  CBC: Recent Labs  Lab 06/27/19 1059  WBC 6.1  NEUTROABS 3.5  HGB 10.1*  HCT 32.2*  MCV 89.9  PLT 207   Cardiac Enzymes: No results for input(s): CKTOTAL, CKMB, CKMBINDEX, TROPONINI in the last 168 hours. BNP (last 3 results) No results for input(s): PROBNP in the last 8760 hours. CBG: Recent Labs  Lab 06/30/19 1646 06/30/19 2039 07/01/19 0730 07/01/19 1122 07/01/19 1552  GLUCAP 145* 243* 125* 175* 199*   D-Dimer: No results for input(s): DDIMER in the last 72 hours. Hgb A1c: No results for input(s): HGBA1C in the last  72 hours. Lipid Profile: No results for input(s): CHOL, HDL, LDLCALC, TRIG, CHOLHDL, LDLDIRECT in the last 72 hours. Thyroid  function studies: No results for input(s): TSH, T4TOTAL, T3FREE, THYROIDAB in the last 72 hours.  Invalid input(s): FREET3 Anemia work up: No results for input(s): VITAMINB12, FOLATE, FERRITIN, TIBC, IRON, RETICCTPCT in the last 72 hours. Sepsis Labs: Recent Labs  Lab 06/27/19 1059  WBC 6.1    Microbiology Recent Results (from the past 240 hour(s))  Respiratory Panel by RT PCR (Flu A&B, Covid) - Nasopharyngeal Swab     Status: None   Collection Time: 06/27/19  2:43 PM   Specimen: Nasopharyngeal Swab  Result Value Ref Range Status   SARS Coronavirus 2 by RT PCR NEGATIVE NEGATIVE Final    Comment: (NOTE) SARS-CoV-2 target nucleic acids are NOT DETECTED. The SARS-CoV-2 RNA is generally detectable in upper respiratoy specimens during the acute phase of infection. The lowest concentration of SARS-CoV-2 viral copies this assay can detect is 131 copies/mL. A negative result does not preclude SARS-Cov-2 infection and should not be used as the sole basis for treatment or other patient management decisions. A negative result may occur with  improper specimen collection/handling, submission of specimen other than nasopharyngeal swab, presence of viral mutation(s) within the areas targeted by this assay, and inadequate number of viral copies (<131 copies/mL). A negative result must be combined with clinical observations, patient history, and epidemiological information. The expected result is Negative. Fact Sheet for Patients:  PinkCheek.be Fact Sheet for Healthcare Providers:  GravelBags.it This test is not yet ap proved or cleared by the [place] FDA and  has been authorized for detection and/or diagnosis of SARS-CoV-2 by FDA under an Emergency Use Authorization (EUA). This EUA will remain  in  effect (meaning this test can be used) for the duration of the COVID-19 declaration under Section 564(b)(1) of the Act, 21 U.S.C. section 360bbb-3(b)(1), unless the authorization is terminated or revoked sooner.    Influenza A by PCR NEGATIVE NEGATIVE Final   Influenza B by PCR NEGATIVE NEGATIVE Final    Comment: (NOTE) The Xpert Xpress SARS-CoV-2/FLU/RSV assay is intended as an aid in  the diagnosis of influenza from Nasopharyngeal swab specimens and  should not be used as a sole basis for treatment. Nasal washings and  aspirates are unacceptable for Xpert Xpress SARS-CoV-2/FLU/RSV  testing. Fact Sheet for Patients: PinkCheek.be Fact Sheet for Healthcare Providers: GravelBags.it This test is not yet approved or cleared by the [place] FDA and  has been authorized for detection and/or diagnosis of SARS-CoV-2 by  FDA under an Emergency Use Authorization (EUA). This EUA will remain  in effect (meaning this test can be used) for the duration of the  Covid-19 declaration under Section 564(b)(1) of the Act, 21  U.S.C. section 360bbb-3(b)(1), unless the authorization is  terminated or revoked. Performed at [facility], [address] [place], [place] [address]     Procedures and diagnostic studies:  No results found.  Medications:   . carvedilol  25 mg Oral BID WC  . heparin  5,000 Units Subcutaneous Q8H  . insulin aspart  0-6 Units Subcutaneous TID WC  . magnesium oxide  800 mg Oral BID  . mometasone-formoterol  2 puff Inhalation BID  . omega-3 acid ethyl esters  1 g Oral BID  . rOPINIRole  0.5 mg Oral QHS  . sacubitril-valsartan  1 tablet Oral BID  . sodium chloride flush  3 mL Intravenous Q12H  . tamsulosin  0.4 mg Oral Daily  . tiotropium  18 mcg Inhalation Daily   Continuous Infusions: . sodium chloride    .  furosemide (LASIX) infusion 8 mg/hr (07/01/19 1226)     LOS: 4 days   Copelan Maultsby  [facility]     07/01/2019, 4:09 PM

## 2019-07-01 NOTE — Progress Notes (Signed)
pts HR 51/ orders to hold evening dose of coreg/ will monitor

## 2019-07-01 NOTE — Clinical Social Work Note (Addendum)
1.  Do you have a scale? No-- scale and blood pressure cuff provided at bedside 2.  Do you weigh yourself daily? yes 3.  Two pounds over night or 5 in one week call your doctor because it could be fluid overload? aware 4.  Do you go to the Heart Failure Clinic? yes 5. An appointment with heart failure clinic will be made before discharge if.  If patient is home bound, home health can be arranged with an agency that provides a heart  failure protocol. NO additional needs at this time.  Shawn Hayes, Shawn Hayes

## 2019-07-01 NOTE — Progress Notes (Signed)
Talked with Pt about warning signs of heart failure to monitor for at home such as weight gain and shortness of breath. He states understanding.

## 2019-07-02 LAB — BASIC METABOLIC PANEL
Anion gap: 10 (ref 5–15)
BUN: 47 mg/dL — ABNORMAL HIGH (ref 8–23)
CO2: 26 mmol/L (ref 22–32)
Calcium: 8.9 mg/dL (ref 8.9–10.3)
Chloride: 97 mmol/L — ABNORMAL LOW (ref 98–111)
Creatinine, Ser: 1.97 mg/dL — ABNORMAL HIGH (ref 0.61–1.24)
GFR calc Af Amer: 40 mL/min — ABNORMAL LOW (ref >60)
GFR calc non Af Amer: 35 mL/min — ABNORMAL LOW (ref >60)
Glucose, Bld: 129 mg/dL — ABNORMAL HIGH (ref 70–99)
Potassium: 4.4 mmol/L (ref 3.5–5.1)
Sodium: 133 mmol/L — ABNORMAL LOW (ref 135–145)

## 2019-07-02 LAB — GLUCOSE, CAPILLARY
Glucose-Capillary: 119 mg/dL — ABNORMAL HIGH (ref 70–99)
Glucose-Capillary: 183 mg/dL — ABNORMAL HIGH (ref 70–99)
Glucose-Capillary: 247 mg/dL — ABNORMAL HIGH (ref 70–99)
Glucose-Capillary: 190 mg/dL — ABNORMAL HIGH (ref 70–99)

## 2019-07-02 LAB — MAGNESIUM: Magnesium: 2.2 mg/dL (ref 1.7–2.4)

## 2019-07-02 MED ORDER — CARVEDILOL 12.5 MG PO TABS
12.5000 mg | ORAL_TABLET | Freq: Two times a day (BID) | ORAL | Status: DC
  Administered 2019-07-04: 12.5 mg via ORAL
  Filled 2019-07-02 (×2): qty 1

## 2019-07-02 NOTE — Progress Notes (Signed)
   07/01/19 1012  ReDS Vest / Clip  Station Marker D  Ruler Value 34  ReDS Value Range (!) > 40  ReDS Actual Value 52

## 2019-07-02 NOTE — Progress Notes (Signed)
CCMD called stating Pt was in asystole (possible leads off). I assessed patient and two leads were off. Patient was alert and oriented. Leads were reattached.

## 2019-07-02 NOTE — Progress Notes (Signed)
   07/02/19 1129  ReDS Vest / Clip  Station Marker D  Ruler Value 34  ReDS Value Range (!) > 40  ReDS Actual Value 46

## 2019-07-02 NOTE — Progress Notes (Signed)
Progress Note    Shawn Hayes  WCB:[id number] DOB: Nov 26, 1955  DOA: 06/27/2019 PCP: [name], MD      Brief Narrative:    Medical records reviewed and are as summarized below:  Shawn Hayes is an 64 y.o. male with medical history significant for type 2 diabetes mellitus, hypertension, CKD stage IIIb, chronic systolic and diastolic CHF, nonischemic cardiomyopathy, BPH, presented to the hospital because of shortness of breath.  He said he has been having shortness of breath for about a month now.  This has been associated with increasing bilateral leg swelling.  He had been taking his Lasix at home without any improvement.  He even went to the heart failure clinic and he was given IV Lasix for 2 consecutive days in the 2 days preceding admission but his symptoms did not improve.  He came to the emergency room at the behest of his nurse practitioner from cardiology office.      Assessment/Plan:   Principal Problem:   Acute on chronic combined systolic and diastolic CHF (congestive heart failure) (HCC) Active Problems:   CKD stage 3 due to type 2 diabetes mellitus (HCC)  Acute on chronic systolic and diastolic CHF: He failed outpatient IV and oral Lasix.  Patient has produced 15.8 L of urine and has had cumulative net fluid loss of 11.8 L.  REDS vest reading 52 on 07/01/2019 was 46 on 07/02/2019 which is still high.  Of note, REDS vest reading on admission was 53.  Continue IV Lasix infusion but decrease rate from 8 mg/h to 4 mg/h.  Continue Entresto. Coreg has been held intermittently because of sinus bradycardia.    Monitor BMP, daily weights and urine output.  Chart review showed that 2D echo in February 2021 revealed EF estimated at 30 to 61%, grade 1 diastolic dysfunction moderately elevated pulmonary artery systolic pressure.  Encouraged ambulation.  Mildly elevated troponin: Troponin of 31.  Chart review shows that on April 17, 2019, troponins were 27 and  29.  Elevated troponin is probably due to underlying CKD versus CHF.  Musculoskeletal chest pain: Analgesics as needed for pain.  Sinus bradycardia: Asymptomatic.  He is on carvedilol.  Dose has been decreased.  Continue carvedilol if heart rate allows  CAD stage IIIb: Creatinine is around baseline.  Monitor BMP closely.  Type II DM: Hold Metformin and glipizide.  NovoLog as needed for hyperglycemia.  Hypertension: Continue antihypertensives.  Watery stools: Improved.  Imodium as needed for watery stools.  Body mass index is 26.89 kg/m.   Family Communication/Anticipated D/C date and plan/Code Status   DVT prophylaxis: Heparin Code Status: Full code Family Communication: Plan discussed with patient Disposition Plan:    Status is: Inpatient  Remains inpatient appropriate because:IV treatments appropriate due to intensity of illness or inability to take PO and Inpatient level of care appropriate due to severity of illness   Dispo: The patient is from: Home              Anticipated d/c is to: Home              Anticipated d/c date is: 1 day              Patient currently is not medically stable to d/c.  Patient is still requiring IV diuresis.  Reds vest reading still elevated at 46            Subjective:   No complaints.  Overall, he feels better.  He has intermittent shortness of breath.  Leg swelling has improved.  Objective:    Vitals:   07/02/19 0401 07/02/19 0729 07/02/19 1150 07/02/19 1532  BP: 134/82 139/80 132/84 136/76  Pulse: (!) 56 (!) 57 (!) 56 (!) 55  Resp: 18 18 18 17   Temp: (!) 97.5 F (36.4 C) 98.2 F (36.8 C) 98 F (36.7 C) 98.2 F (36.8 C)  TempSrc: Oral Oral Oral Oral  SpO2: 99% 98% 94% 94%  Weight: 92.4 kg     Height:       No data found.   Intake/Output Summary (Last 24 hours) at 07/02/2019 1802 Last data filed at 07/02/2019 1742 Gross per 24 hour  Intake 1178.64 ml  Output 6350 ml  Net -5171.36 ml   Filed Weights    06/30/19 0503 07/01/19 0314 07/02/19 0401  Weight: 99.8 kg 96.7 kg 92.4 kg    Exam:  GEN: No acute distress, sitting up in the chair SKIN: No rash EYES: EOMI ENT: MMM CV: RRR PULM: No rales or wheezing heard ABD: soft, obese, NT, +BS CNS: AAO x 3, non focal EXT:  bilateral leg edema has improved significantly, no tenderness MSK: Mild right lateral chest wall tenderness    Data Reviewed:   I have personally reviewed following labs and imaging studies:  Labs: Labs show the following:   Basic Metabolic Panel: Recent Labs  Lab 06/28/19 0506 06/28/19 0506 06/29/19 0533 06/29/19 0533 06/30/19 2637 06/30/19 0614 07/01/19 0637 07/02/19 0531  NA 139  --  138  --  136  --  136 133*  K 4.4   < > 4.6   < > 4.4   < > 4.4 4.4  CL 106  --  105  --  103  --  102 97*  CO2 24  --  25  --  25  --  25 26  GLUCOSE 126*  --  136*  --  147*  --  137* 129*  BUN 30*  --  34*  --  41*  --  44* 47*  CREATININE 1.76*  --  1.81*  --  1.89*  --  1.97* 1.97*  CALCIUM 8.6*  --  8.5*  --  8.7*  --  8.7* 8.9  MG 1.7  --  1.8  --  2.0  --  2.1 2.2   < > = values in this interval not displayed.   GFR Estimated Creatinine Clearance: 42.2 mL/min (A) (by C-G formula based on SCr of 1.97 mg/dL (H)). Liver Function Tests: Recent Labs  Lab 06/27/19 1059  AST 15  ALT 12  ALKPHOS 94  BILITOT 0.9  PROT 7.4  ALBUMIN 3.3*   No results for input(s): LIPASE, AMYLASE in the last 168 hours. No results for input(s): AMMONIA in the last 168 hours. Coagulation profile No results for input(s): INR, PROTIME in the last 168 hours.  CBC: Recent Labs  Lab 06/27/19 1059  WBC 6.1  NEUTROABS 3.5  HGB 10.1*  HCT 32.2*  MCV 89.9  PLT 207   Cardiac Enzymes: No results for input(s): CKTOTAL, CKMB, CKMBINDEX, TROPONINI in the last 168 hours. BNP (last 3 results) No results for input(s): PROBNP in the last 8760 hours. CBG: Recent Labs  Lab 07/01/19 1552 07/01/19 2141 07/02/19 0728 07/02/19 1150  07/02/19 1622  GLUCAP 199* 167* 119* 183* 247*   D-Dimer: No results for input(s): DDIMER in the last 72 hours. Hgb A1c: No results for input(s): HGBA1C in the last 72 hours.  Lipid Profile: No results for input(s): CHOL, HDL, LDLCALC, TRIG, CHOLHDL, LDLDIRECT in the last 72 hours. Thyroid function studies: No results for input(s): TSH, T4TOTAL, T3FREE, THYROIDAB in the last 72 hours.  Invalid input(s): FREET3 Anemia work up: No results for input(s): VITAMINB12, FOLATE, FERRITIN, TIBC, IRON, RETICCTPCT in the last 72 hours. Sepsis Labs: Recent Labs  Lab 06/27/19 1059  WBC 6.1    Microbiology Recent Results (from the past 240 hour(s))  Respiratory Panel by RT PCR (Flu A&B, Covid) - Nasopharyngeal Swab     Status: None   Collection Time: 06/27/19  2:43 PM   Specimen: Nasopharyngeal Swab  Result Value Ref Range Status   SARS Coronavirus 2 by RT PCR NEGATIVE NEGATIVE Final    Comment: (NOTE) SARS-CoV-2 target nucleic acids are NOT DETECTED. The SARS-CoV-2 RNA is generally detectable in upper respiratoy specimens during the acute phase of infection. The lowest concentration of SARS-CoV-2 viral copies this assay can detect is 131 copies/mL. A negative result does not preclude SARS-Cov-2 infection and should not be used as the sole basis for treatment or other patient management decisions. A negative result may occur with  improper specimen collection/handling, submission of specimen other than nasopharyngeal swab, presence of viral mutation(s) within the areas targeted by this assay, and inadequate number of viral copies (<131 copies/mL). A negative result must be combined with clinical observations, patient history, and epidemiological information. The expected result is Negative. Fact Sheet for Patients:  PinkCheek.be Fact Sheet for Healthcare Providers:  GravelBags.it This test is not yet ap proved or cleared by the  [place] FDA and  has been authorized for detection and/or diagnosis of SARS-CoV-2 by FDA under an Emergency Use Authorization (EUA). This EUA will remain  in effect (meaning this test can be used) for the duration of the COVID-19 declaration under Section 564(b)(1) of the Act, 21 U.S.C. section 360bbb-3(b)(1), unless the authorization is terminated or revoked sooner.    Influenza A by PCR NEGATIVE NEGATIVE Final   Influenza B by PCR NEGATIVE NEGATIVE Final    Comment: (NOTE) The Xpert Xpress SARS-CoV-2/FLU/RSV assay is intended as an aid in  the diagnosis of influenza from Nasopharyngeal swab specimens and  should not be used as a sole basis for treatment. Nasal washings and  aspirates are unacceptable for Xpert Xpress SARS-CoV-2/FLU/RSV  testing. Fact Sheet for Patients: PinkCheek.be Fact Sheet for Healthcare Providers: GravelBags.it This test is not yet approved or cleared by the [place] FDA and  has been authorized for detection and/or diagnosis of SARS-CoV-2 by  FDA under an Emergency Use Authorization (EUA). This EUA will remain  in effect (meaning this test can be used) for the duration of the  Covid-19 declaration under Section 564(b)(1) of the Act, 21  U.S.C. section 360bbb-3(b)(1), unless the authorization is  terminated or revoked. Performed at [facility], [address] [place], [place] [address]     Procedures and diagnostic studies:  No results found.  Medications:   . carvedilol  12.5 mg Oral BID WC  . heparin  5,000 Units Subcutaneous Q8H  . insulin aspart  0-6 Units Subcutaneous TID WC  . magnesium oxide  800 mg Oral BID  . mometasone-formoterol  2 puff Inhalation BID  . omega-3 acid ethyl esters  1 g Oral BID  . rOPINIRole  0.5 mg Oral QHS  . sacubitril-valsartan  1 tablet Oral BID  . sodium chloride flush  3 mL Intravenous Q12H  . tamsulosin  0.4 mg Oral Daily  .  tiotropium  18 mcg Inhalation Daily   Continuous Infusions: . sodium chloride    . furosemide (LASIX) infusion 8 mg/hr (07/02/19 0622)     LOS: 5 days   Deionte Spivack  [facility]     07/02/2019, 6:02 PM

## 2019-07-03 DIAGNOSIS — I5043 Acute on chronic combined systolic (congestive) and diastolic (congestive) heart failure: Secondary | ICD-10-CM

## 2019-07-03 LAB — BASIC METABOLIC PANEL
Anion gap: 10 (ref 5–15)
BUN: 49 mg/dL — ABNORMAL HIGH (ref 8–23)
CO2: 29 mmol/L (ref 22–32)
Calcium: 9.1 mg/dL (ref 8.9–10.3)
Chloride: 98 mmol/L (ref 98–111)
Creatinine, Ser: 2.05 mg/dL — ABNORMAL HIGH (ref 0.61–1.24)
GFR calc Af Amer: 38 mL/min — ABNORMAL LOW (ref >60)
GFR calc non Af Amer: 33 mL/min — ABNORMAL LOW (ref >60)
Glucose, Bld: 152 mg/dL — ABNORMAL HIGH (ref 70–99)
Potassium: 4.6 mmol/L (ref 3.5–5.1)
Sodium: 137 mmol/L (ref 135–145)

## 2019-07-03 LAB — GLUCOSE, CAPILLARY
Glucose-Capillary: 136 mg/dL — ABNORMAL HIGH (ref 70–99)
Glucose-Capillary: 154 mg/dL — ABNORMAL HIGH (ref 70–99)
Glucose-Capillary: 193 mg/dL — ABNORMAL HIGH (ref 70–99)
Glucose-Capillary: 180 mg/dL — ABNORMAL HIGH (ref 70–99)

## 2019-07-03 LAB — MAGNESIUM: Magnesium: 2.5 mg/dL — ABNORMAL HIGH (ref 1.7–2.4)

## 2019-07-03 MED ORDER — MAGNESIUM OXIDE 400 (241.3 MG) MG PO TABS
400.0000 mg | ORAL_TABLET | Freq: Two times a day (BID) | ORAL | Status: DC
  Administered 2019-07-03: 400 mg via ORAL
  Filled 2019-07-03: qty 1

## 2019-07-03 MED ORDER — TORSEMIDE 20 MG PO TABS: 40.0000 mg | ORAL_TABLET | Freq: Every day | ORAL | Status: DC

## 2019-07-03 NOTE — Evaluation (Signed)
Physical Therapy Evaluation Patient Details Name: Shawn Hayes MRN: [id number] DOB: Oct 31, 1955 Today's Date: 07/03/2019   History of Present Illness  Pt is 64 y/o M with PMH idiopathic CMY (EF 25% on Echo 11/18/2018), moderate mitral valve regurgitation, T2DM, CKD3, and anemia. Pt presented to ED d/t SOB and LE swelling. Pt with acute on chronic HFrEF exacerbation.  Clinical Impression  Patient received lying on his side in bed. Agrees to PT assessment. He is somewhat groggy upon sitting up on side of bed. Demonstrates decreased safety awareness with mobility. He is unsteady with initial standing requiring min assist. Ambulated to bathroom with min assist. Staggering gait. He continued to be unsteady while ambulating in hallway requiring min assist, therefore encouraged to hold to rail for safety. He will continue to benefit from skilled PT while here to improve activity tolerance, balance and safety for return home at discharge.       Follow Up Recommendations No PT follow up    Equipment Recommendations  Rolling walker with 5" wheels    Recommendations for Other Services       Precautions / Restrictions Precautions Precautions: Fall Restrictions Weight Bearing Restrictions: No      Mobility  Bed Mobility Overal bed mobility: Modified Independent             General bed mobility comments: increased time and effort for supine to sit  Transfers Overall transfer level: Needs assistance Equipment used: None Transfers: Sit to/from Stand Sit to Stand: Min assist         General transfer comment: patient is unsteady with initial standing  Ambulation/Gait Ambulation/Gait assistance: Min assist Gait Distance (Feet): 75 Feet Assistive device: None Gait Pattern/deviations: Step-through pattern;Narrow base of support;Ataxic;Staggering left;Staggering right Gait velocity: decr   General Gait Details: Patient is very unsteady with ambulation requiring min assist to steady.  Also encouraged to use rail in hallway as he has decreased safety awareness. Patient has very narrow base of support  Stairs            Wheelchair Mobility    Modified Rankin (Stroke Patients Only)       Balance Overall balance assessment: Needs assistance Sitting-balance support: Feet supported Sitting balance-Leahy Scale: Good     Standing balance support: Single extremity supported;During functional activity Standing balance-Leahy Scale: Poor Standing balance comment: poor dynamic standing balance, multiple staggers, catching himself on walls etc.                             Pertinent Vitals/Pain Pain Assessment: No/denies pain    Home Living Family/patient expects to be discharged to:: Private residence   Available Help at Discharge: Family;Available PRN/intermittently Type of Home: Other(Comment)(states he lives in the back of a store) Home Access: Level entry     Home Layout: One [place]: [place] - single point      Prior Function Level of Independence: Independent with assistive device(s)         Comments: reports he has a cane to use when needed     Hand Dominance        Extremity/Trunk Assessment   Upper Extremity Assessment Upper Extremity Assessment: Generalized weakness    Lower Extremity Assessment Lower Extremity Assessment: Generalized weakness    Cervical / Trunk Assessment Cervical / Trunk Assessment: Normal  Communication   Communication: No difficulties  Cognition Arousal/Alertness: Awake/alert Behavior During Therapy: WFL for tasks assessed/performed Overall Cognitive Status: Within Functional Limits  for tasks assessed                                        General Comments      Exercises     Assessment/Plan    PT Assessment Patient needs continued PT services  PT Problem List Decreased strength;Decreased mobility;Decreased safety awareness;Decreased activity  tolerance;Decreased balance       PT Treatment Interventions DME instruction;Therapeutic activities;Gait training;Therapeutic exercise;Patient/family education;Functional mobility training;Balance training    PT Goals (Current goals can be found in the Care Plan section)  Acute Rehab PT Goals Patient Stated Goal: to go home PT Goal Formulation: With patient Time For Goal Achievement: 07/10/19 Potential to Achieve Goals: Good    Frequency Min 2X/week   Barriers to discharge Decreased caregiver support      Co-evaluation               AM-PAC PT "6 Clicks" Mobility  Outcome Measure Help needed turning from your back to your side while in a flat bed without using bedrails?: None Help needed moving from lying on your back to sitting on the side of a flat bed without using bedrails?: None Help needed moving to and from a bed to a chair (including a wheelchair)?: A Little Help needed standing up from a chair using your arms (e.g., wheelchair or bedside chair)?: A Little Help needed to walk in hospital room?: A Lot Help needed climbing 3-5 steps with a railing? : A Lot 6 Click Score: 18    End of Session Equipment Utilized During Treatment: Gait belt Activity Tolerance: Patient limited by fatigue Patient left: in bed;with bed alarm set Nurse Communication: Mobility status PT Visit Diagnosis: Unsteadiness on feet (R26.81);Muscle weakness (generalized) (M62.81);Difficulty in walking, not elsewhere classified (R26.2);History of falling (Z91.81)    Time: 9233-0076 PT Time Calculation (min) (ACUTE ONLY): 23 min   Charges:   PT Evaluation $PT Eval Moderate Complexity: 1 Mod PT Treatments $Gait Training: 8-22 mins        Gibson Telleria, PT, GCS 07/03/19,2:08 PM

## 2019-07-03 NOTE — Progress Notes (Signed)
PROGRESS NOTE    Shawn Hayes  NWG:[id number] DOB: 1955/12/31 DOA: 06/27/2019 PCP: [name], MD   Brief Narrative:  Shawn Hayes is an 64 y.o. male with medical history significant for type 2 diabetes mellitus, hypertension, CKD stage IIIb, chronic systolic and diastolic CHF, nonischemic cardiomyopathy, BPH, presented to the hospital because of shortness of breath. He said he has been having shortness of breath for about a month now. This has been associated with increasing bilateral leg swelling. He had been taking his Lasix at home without any improvement. He even went to the heart failure clinic and he was given IV Lasix for 2 consecutive days in the 2 days preceding admission but his symptoms did not improve.  He came to the emergency room at the request of his nurse practitioner from cardiology office.  Subjective: Patient has no new complaints today.  Thinks that his shortness of breath is improving.  He wants to go back home.  Assessment & Plan:   Principal Problem:   Acute on chronic combined systolic and diastolic CHF (congestive heart failure) (HCC) Active Problems:   CKD stage 3 due to type 2 diabetes mellitus (HCC)  Acute on chronic systolic and diastolic heart failure.  Patient was admitted with volume overload.  Continue to have elevated REDS vest reading.  He was initially treated with Lasix gtt. appears euvolemic with weight down to 196 pound from 226 on admission.  Baseline around 210 pounds.  Creatinine increased to 2.05.  Echo done in February 2021 with EF of 30 to 35% and grade 1 diastolic dysfunction with moderately elevated pulmonary artery  systolic pressure. -Discontinue Lasix infusion. -He will be started on p.o. Torsemide tomorrow. -Continue Entresto and Coreg. -Continue monitoring with daily weight, daily BMP and strict intake and output.  Elevated troponin.  Most likely secondary to demand.  Flat curve.  It also has underlying  CKD.  Sinus bradycardia: Asymptomatic.  He is on carvedilol.  Dose has been decreased.  Continue carvedilol if heart rate allows  CKD stage IIIb: Creatinine increased to 2.05 today. -Continue to monitor. -Avoid nephrotoxins  Type 2 diabetes.  Holding home Metformin and glipizide. -SSI as needed.  Hypertension. -Continue antihypertensives.  Body mass index is 26.89 kg/m.  Objective: Vitals:   07/03/19 0359 07/03/19 0740 07/03/19 1141 07/03/19 1520  BP: (!) 154/91 (!) 148/92 130/73 140/87  Pulse: (!) 59 (!) 56 (!) 56 (!) 55  Resp: 19 18 18 17   Temp: 98 F (36.7 C) 98 F (36.7 C) 98.2 F (36.8 C) 98.4 F (36.9 C)  TempSrc: Oral Oral Oral   SpO2: 94% 94% 98% 93%  Weight:      Height:        Intake/Output Summary (Last 24 hours) at 07/03/2019 1759 Last data filed at 07/03/2019 1336 Gross per 24 hour  Intake 1009.57 ml  Output 3150 ml  Net -2140.43 ml   Filed Weights   07/01/19 0314 07/02/19 0401 07/03/19 0136  Weight: 96.7 kg 92.4 kg 89 kg    Examination:  General exam: Appears calm and comfortable  Respiratory system: Clear to auscultation. Respiratory effort normal. Cardiovascular system: S1 & S2 heard, RRR. No JVD, murmurs, rubs, gallops or clicks. Gastrointestinal system: Soft, nontender, nondistended, bowel sounds positive. Central nervous system: Alert and oriented. No focal neurological deficits.Symmetric 5 x 5 power. Extremities: No edema, no cyanosis, pulses intact and symmetrical. Psychiatry: Judgement and insight appear normal.    DVT prophylaxis: Heparin Code Status: Full Family  Communication: Discussed with patient Disposition Plan:  Status is: Inpatient  Remains inpatient appropriate because:Inpatient level of care appropriate due to severity of illness   Dispo: The patient is from: Home              Anticipated d/c is to: Home              Anticipated d/c date is: 1 day              Patient currently is not medically stable to  d/c.   Consultants:   None  Procedures:  Antimicrobials:   Data Reviewed: I have personally reviewed following labs and imaging studies  CBC: Recent Labs  Lab 06/27/19 1059  WBC 6.1  NEUTROABS 3.5  HGB 10.1*  HCT 32.2*  MCV 89.9  PLT 161   Basic Metabolic Panel: Recent Labs  Lab 06/29/19 0533 06/30/19 0614 07/01/19 0637 07/02/19 0531 07/03/19 0510  NA 138 136 136 133* 137  K 4.6 4.4 4.4 4.4 4.6  CL 105 103 102 97* 98  CO2 25 25 25 26 29   GLUCOSE 136* 147* 137* 129* 152*  BUN 34* 41* 44* 47* 49*  CREATININE 1.81* 1.89* 1.97* 1.97* 2.05*  CALCIUM 8.5* 8.7* 8.7* 8.9 9.1  MG 1.8 2.0 2.1 2.2 2.5*   GFR: Estimated Creatinine Clearance: 40.6 mL/min (A) (by C-G formula based on SCr of 2.05 mg/dL (H)). Liver Function Tests: Recent Labs  Lab 06/27/19 1059  AST 15  ALT 12  ALKPHOS 94  BILITOT 0.9  PROT 7.4  ALBUMIN 3.3*   No results for input(s): LIPASE, AMYLASE in the last 168 hours. No results for input(s): AMMONIA in the last 168 hours. Coagulation Profile: No results for input(s): INR, PROTIME in the last 168 hours. Cardiac Enzymes: No results for input(s): CKTOTAL, CKMB, CKMBINDEX, TROPONINI in the last 168 hours. BNP (last 3 results) No results for input(s): PROBNP in the last 8760 hours. HbA1C: No results for input(s): HGBA1C in the last 72 hours. CBG: Recent Labs  Lab 07/02/19 1622 07/02/19 2121 07/03/19 0741 07/03/19 1141 07/03/19 1634  GLUCAP 247* 190* 136* 154* 193*   Lipid Profile: No results for input(s): CHOL, HDL, LDLCALC, TRIG, CHOLHDL, LDLDIRECT in the last 72 hours. Thyroid Function Tests: No results for input(s): TSH, T4TOTAL, FREET4, T3FREE, THYROIDAB in the last 72 hours. Anemia Panel: No results for input(s): VITAMINB12, FOLATE, FERRITIN, TIBC, IRON, RETICCTPCT in the last 72 hours. Sepsis Labs: No results for input(s): PROCALCITON, LATICACIDVEN in the last 168 hours.  Recent Results (from the past 240 hour(s))   Respiratory Panel by RT PCR (Flu A&B, Covid) - Nasopharyngeal Swab     Status: None   Collection Time: 06/27/19  2:43 PM   Specimen: Nasopharyngeal Swab  Result Value Ref Range Status   SARS Coronavirus 2 by RT PCR NEGATIVE NEGATIVE Final    Comment: (NOTE) SARS-CoV-2 target nucleic acids are NOT DETECTED. The SARS-CoV-2 RNA is generally detectable in upper respiratoy specimens during the acute phase of infection. The lowest concentration of SARS-CoV-2 viral copies this assay can detect is 131 copies/mL. A negative result does not preclude SARS-Cov-2 infection and should not be used as the sole basis for treatment or other patient management decisions. A negative result may occur with  improper specimen collection/handling, submission of specimen other than nasopharyngeal swab, presence of viral mutation(s) within the areas targeted by this assay, and inadequate number of viral copies (<131 copies/mL). A negative result must be combined with clinical observations,  patient history, and epidemiological information. The expected result is Negative. Fact Sheet for Patients:  PinkCheek.be Fact Sheet for Healthcare Providers:  GravelBags.it This test is not yet ap proved or cleared by the [place] FDA and  has been authorized for detection and/or diagnosis of SARS-CoV-2 by FDA under an Emergency Use Authorization (EUA). This EUA will remain  in effect (meaning this test can be used) for the duration of the COVID-19 declaration under Section 564(b)(1) of the Act, 21 U.S.C. section 360bbb-3(b)(1), unless the authorization is terminated or revoked sooner.    Influenza A by PCR NEGATIVE NEGATIVE Final   Influenza B by PCR NEGATIVE NEGATIVE Final    Comment: (NOTE) The Xpert Xpress SARS-CoV-2/FLU/RSV assay is intended as an aid in  the diagnosis of influenza from Nasopharyngeal swab specimens and  should not be used as a sole  basis for treatment. Nasal washings and  aspirates are unacceptable for Xpert Xpress SARS-CoV-2/FLU/RSV  testing. Fact Sheet for Patients: PinkCheek.be Fact Sheet for Healthcare Providers: GravelBags.it This test is not yet approved or cleared by the [place] FDA and  has been authorized for detection and/or diagnosis of SARS-CoV-2 by  FDA under an Emergency Use Authorization (EUA). This EUA will remain  in effect (meaning this test can be used) for the duration of the  Covid-19 declaration under Section 564(b)(1) of the Act, 21  U.S.C. section 360bbb-3(b)(1), unless the authorization is  terminated or revoked. Performed at [facility], [address], [place], [place] [address]      Radiology Studies: No results found.  Scheduled Meds: . carvedilol  12.5 mg Oral BID WC  . heparin  5,000 Units Subcutaneous Q8H  . insulin aspart  0-6 Units Subcutaneous TID WC  . magnesium oxide  400 mg Oral BID  . mometasone-formoterol  2 puff Inhalation BID  . omega-3 acid ethyl esters  1 g Oral BID  . rOPINIRole  0.5 mg Oral QHS  . sacubitril-valsartan  1 tablet Oral BID  . sodium chloride flush  3 mL Intravenous Q12H  . tamsulosin  0.4 mg Oral Daily  . tiotropium  18 mcg Inhalation Daily   Continuous Infusions: . sodium chloride       LOS: 6 days   Time spent: 40 minutes.  Lorella Nimrod, MD [facility]  If 7PM-7AM, please contact night-coverage [email or web address]  07/03/2019, 5:59 PM   This record has been created using Systems analyst. Errors have been sought and corrected,but may not always be located. Such creation errors do not reflect on the standard of care.

## 2019-07-03 NOTE — Progress Notes (Signed)
   07/03/19 8177  ReDS Vest / Clip  Station Marker C  Ruler Value 34  ReDS Value Range (!) > 40  ReDS Actual Value 46

## 2019-07-04 LAB — CBC
HCT: 34.1 % — ABNORMAL LOW (ref 39.0–52.0)
Hemoglobin: 11.4 g/dL — ABNORMAL LOW (ref 13.0–17.0)
MCH: 28.4 pg (ref 26.0–34.0)
MCHC: 33.4 g/dL (ref 30.0–36.0)
MCV: 85 fL (ref 80.0–100.0)
Platelets: 188 10*3/uL (ref 150–400)
RBC: 4.01 MIL/uL — ABNORMAL LOW (ref 4.22–5.81)
RDW: 16.7 % — ABNORMAL HIGH (ref 11.5–15.5)
WBC: 7.3 10*3/uL (ref 4.0–10.5)
nRBC: 0 % (ref 0.0–0.2)

## 2019-07-04 LAB — BASIC METABOLIC PANEL
Anion gap: 7 (ref 5–15)
BUN: 54 mg/dL — ABNORMAL HIGH (ref 8–23)
CO2: 26 mmol/L (ref 22–32)
Calcium: 8.5 mg/dL — ABNORMAL LOW (ref 8.9–10.3)
Chloride: 99 mmol/L (ref 98–111)
Creatinine, Ser: 2.1 mg/dL — ABNORMAL HIGH (ref 0.61–1.24)
GFR calc Af Amer: 37 mL/min — ABNORMAL LOW (ref >60)
GFR calc non Af Amer: 32 mL/min — ABNORMAL LOW (ref >60)
Glucose, Bld: 185 mg/dL — ABNORMAL HIGH (ref 70–99)
Potassium: 4.7 mmol/L (ref 3.5–5.1)
Sodium: 132 mmol/L — ABNORMAL LOW (ref 135–145)

## 2019-07-04 LAB — MAGNESIUM: Magnesium: 2.6 mg/dL — ABNORMAL HIGH (ref 1.7–2.4)

## 2019-07-04 LAB — GLUCOSE, CAPILLARY: Glucose-Capillary: 155 mg/dL — ABNORMAL HIGH (ref 70–99)

## 2019-07-04 MED ORDER — CARVEDILOL 12.5 MG PO TABS: 12.5000 mg | ORAL_TABLET | Freq: Two times a day (BID) | ORAL | 1 refills | Status: DC

## 2019-07-04 MED ORDER — TORSEMIDE 20 MG PO TABS: 40.0000 mg | ORAL_TABLET | Freq: Every day | ORAL | 0 refills | Status: DC

## 2019-07-04 MED ORDER — MAGNESIUM OXIDE 400 (241.3 MG) MG PO TABS
400.0000 mg | ORAL_TABLET | Freq: Every day | ORAL | Status: DC
  Administered 2019-07-04: 400 mg via ORAL
  Filled 2019-07-04: qty 1

## 2019-07-04 NOTE — Care Management Important Message (Signed)
Important Message  Patient Details  Name: Shawn Hayes MRN: [id number] Date of Birth: 1955/07/18   Medicare Important Message Given:  No  Patient discharged prior to arrival to unit to deliver concurrent Medicare IM.    Dannette Barbara 07/04/2019, 2:54 PM

## 2019-07-04 NOTE — Plan of Care (Signed)
  Problem: Education: Goal: Knowledge of General Education information will improve Description: Including pain rating scale, medication(s)/side effects and non-pharmacologic comfort measures Outcome: Progressing   Problem: Clinical Measurements: Goal: Ability to maintain clinical measurements within normal limits will improve Outcome: Progressing   

## 2019-07-04 NOTE — Discharge Summary (Signed)
Physician Discharge Summary  BARRE AYDELOTT TIR:[id number] DOB: 1955-05-10 DOA: 06/27/2019  PCP: [name], MD  Admit date: 06/27/2019 Discharge date: 07/04/2019  Admitted From: Home Disposition: Home  Recommendations for Outpatient Follow-up:  1. Follow up with PCP early next week. 2. Follow up with heart failure clinic 3. Please obtain BMP/CBC in one week 4. Please follow up on the following pending results:None  Home Health:No Equipment/Devices:None Discharge Condition: Stable CODE STATUS: Full Diet recommendation: Heart Healthy / Carb Modified    Brief/Interim Summary: Shawn Hynek Robesonis an 64 y.o.malewith medical history significant for type 2 diabetes mellitus, hypertension, CKD stage IIIb, chronic systolic and diastolic CHF, nonischemic cardiomyopathy, BPH, presented to the hospital because of shortness of breath. He said he has been having shortness of breath for about a month now. This has been associated with increasing bilateral leg swelling. He had been taking his Lasix at home without any improvement. He even went to the heart failure clinic and he was given IV Lasix for 2 consecutive days in the 2 days preceding admission but his symptoms did not improve. He came to the emergency room at the request of his nurse practitioner from cardiology office. He was initially diuresed with IV Lasix infusion, weight down to 192 pounds on discharge from 226 pounds on admission.  Echo done in February 2021 with EF of 30 to 35% and grade 1 diastolic dysfunction with moderately elevated pulmonary artery  systolic pressure.  Lasix infusion was discontinued due to increasing creatinine.  Patient appears euvolemic clinically. He was started on torsemide on discharge and will continue home dose of Entresto and Coreg.  Coreg dose was decreased due to asymptomatic bradycardia  He had mildly elevated troponin with flat curve most likely secondary to demand.  He will continue rest  of home meds and will follow up with PCP and cardiology.  Discharge Diagnoses:  Principal Problem:   Acute on chronic combined systolic and diastolic CHF (congestive heart failure) (HCC) Active Problems:   CKD stage 3 due to type 2 diabetes mellitus [facility])  Discharge Instructions  Discharge Instructions    (HEART FAILURE PATIENTS) Call MD:  Anytime you have any of the following symptoms: 1) 3 pound weight gain in 24 hours or 5 pounds in 1 week 2) shortness of breath, with or without a dry hacking cough 3) swelling in the hands, feet or stomach 4) if you have to sleep on extra pillows at night in order to breathe.   Complete by: As directed    Amb Referral to Cardiac Rehabilitation   Complete by: As directed    Diagnosis: Heart Failure (see criteria below if ordering Phase II)   Heart Failure Type: Chronic Systolic & Diastolic   After initial evaluation and assessments completed: Virtual Based Care may be provided alone or in conjunction with Phase 2 Cardiac Rehab based on patient barriers.: Yes   Amb Referral to HF Clinic   Complete by: As directed    Diet - low sodium heart healthy   Complete by: As directed    Discharge instructions   Complete by: As directed    It was pleasure taking care of you. We made some changes to your medications, decrease the dose of carvedilol to 12.5 mg twice daily instead of 25 mg twice daily. Stop taking Lasix and start taking torsemide 40 mg daily and follow-up with your cardiologist, they should be able to titrate your dose as needed.  Start taking torsemide from tomorrow.  You  also need to follow-up with your primary care physician early next week to have your kidney functions checked.   Increase activity slowly   Complete by: As directed      Allergies as of 07/04/2019   No Known Allergies     Medication List    STOP taking these medications   furosemide 40 MG tablet Commonly known as: LASIX     TAKE these medications   albuterol 108 (90  Base) MCG/ACT inhaler Commonly known as: VENTOLIN HFA Inhale 2 puffs into the lungs every 6 (six) hours as needed for wheezing or shortness of breath.   budesonide-formoterol 160-4.5 MCG/ACT inhaler Commonly known as: Symbicort Inhale 2 puffs into the lungs in the morning and at bedtime.   carvedilol 12.5 MG tablet Commonly known as: COREG Take 1 tablet (12.5 mg total) by mouth 2 (two) times daily with a meal. What changed:   medication strength  how much to take   glipiZIDE 5 MG tablet Commonly known as: GLUCOTROL Take 1 tablet (5 mg total) by mouth 2 (two) times daily.   metFORMIN 500 MG 24 hr tablet Commonly known as: GLUCOPHAGE-XR Take 500 mg by mouth daily.   multivitamin with minerals Tabs tablet Take 1 tablet by mouth daily.   omega-3 acid ethyl esters 1 g capsule Commonly known as: LOVAZA Take 1 g by mouth 2 (two) times daily.   rOPINIRole 0.5 MG tablet Commonly known as: REQUIP Take 1 tablet (0.5 mg total) by mouth at bedtime.   sacubitril-valsartan 24-26 MG Commonly known as: ENTRESTO Take 1 tablet by mouth 2 (two) times daily.   tamsulosin 0.4 MG Caps capsule Commonly known as: FLOMAX Take 1 capsule (0.4 mg total) by mouth daily.   tiotropium 18 MCG inhalation capsule Commonly known as: SPIRIVA Place 1 capsule (18 mcg total) into inhaler and inhale daily.   torsemide 20 MG tablet Commonly known as: Demadex Take 2 tablets (40 mg total) by mouth daily.      Follow-up Information    [place] Follow up on 07/11/2019.   Specialty: Cardiology Why: at 10:30am. Enter through the [place] entrance Contact information: [place] [phone]       [name], MD. Schedule an appointment as soon as possible for a visit.   Specialty: Internal Medicine Why: To be seen early next week to have kidney function checked Contact information: [place] [address] [phone]          No Known Allergies  Consultations:  None  Procedures/Studies: DG Chest 2 View  Result Date: 06/27/2019 CLINICAL DATA:  Elevated BNP and renal function labs. Failed diuresis. Shortness of breath. EXAM: CHEST - 2 VIEW COMPARISON:  04/17/2019 FINDINGS: Heart is enlarged. There is pulmonary vascular congestion. Mild interstitial edema is also present. No overt alveolar edema. No consolidations or pleural effusions. IMPRESSION: Cardiomegaly and mild interstitial edema. Electronically Signed   By: Nolon Nations M.D.   On: 06/27/2019 13:21     Subjective: Patient was feeling better when seen today.  No new complaints.  He thinks his exertional dyspnea is improved and he was eager to go back home.  Discharge Exam: Vitals:   07/04/19 0351 07/04/19 0739  BP: 136/81 125/65  Pulse: (!) 56 (!) 57  Resp:  18  Temp: 97.6 F (36.4 C) 97.6 F (36.4 C)  SpO2: 97% 96%  Vitals:   07/03/19 1520 07/03/19 2003 07/04/19 0351 07/04/19 0739  BP: 140/87 (!) 154/89 136/81 125/65  Pulse: (!) 55 (!) 57 (!) 56 (!) 57  Resp: 17 18  18   Temp: 98.4 F (36.9 C)  97.6 F (36.4 C) 97.6 F (36.4 C)  TempSrc:   Oral   SpO2: 93% 97% 97% 96%  Weight:   87.1 kg   Height:        General: Pt is alert, awake, not in acute distress Cardiovascular: RRR, S1/S2 +, no rubs, no gallops Respiratory: CTA bilaterally, no wheezing, no rhonchi Abdominal: Soft, NT, ND, bowel sounds + Extremities: no edema, no cyanosis   The results of significant diagnostics from this hospitalization (including imaging, microbiology, ancillary and laboratory) are listed below for reference.    Microbiology: Recent Results (from the past 240 hour(s))  Respiratory Panel by RT PCR (Flu A&B, Covid) - Nasopharyngeal Swab     Status: None   Collection Time: 06/27/19  2:43 PM   Specimen: Nasopharyngeal Swab  Result Value Ref Range Status   SARS  Coronavirus 2 by RT PCR NEGATIVE NEGATIVE Final    Comment: (NOTE) SARS-CoV-2 target nucleic acids are NOT DETECTED. The SARS-CoV-2 RNA is generally detectable in upper respiratoy specimens during the acute phase of infection. The lowest concentration of SARS-CoV-2 viral copies this assay can detect is 131 copies/mL. A negative result does not preclude SARS-Cov-2 infection and should not be used as the sole basis for treatment or other patient management decisions. A negative result may occur with  improper specimen collection/handling, submission of specimen other than nasopharyngeal swab, presence of viral mutation(s) within the areas targeted by this assay, and inadequate number of viral copies (<131 copies/mL). A negative result must be combined with clinical observations, patient history, and epidemiological information. The expected result is Negative. Fact Sheet for Patients:  PinkCheek.be Fact Sheet for Healthcare Providers:  GravelBags.it This test is not yet ap proved or cleared by the [place] FDA and  has been authorized for detection and/or diagnosis of SARS-CoV-2 by FDA under an Emergency Use Authorization (EUA). This EUA will remain  in effect (meaning this test can be used) for the duration of the COVID-19 declaration under Section 564(b)(1) of the Act, 21 U.S.C. section 360bbb-3(b)(1), unless the authorization is terminated or revoked sooner.    Influenza A by PCR NEGATIVE NEGATIVE Final   Influenza B by PCR NEGATIVE NEGATIVE Final    Comment: (NOTE) The Xpert Xpress SARS-CoV-2/FLU/RSV assay is intended as an aid in  the diagnosis of influenza from Nasopharyngeal swab specimens and  should not be used as a sole basis for treatment. Nasal washings and  aspirates are unacceptable for Xpert Xpress SARS-CoV-2/FLU/RSV  testing. Fact Sheet for Patients: PinkCheek.be Fact Sheet  for Healthcare Providers: GravelBags.it This test is not yet approved or cleared by the [place] FDA and  has been authorized for detection and/or diagnosis of SARS-CoV-2 by  FDA under an Emergency Use Authorization (EUA). This EUA will remain  in effect (meaning this test can be used) for the duration of the  Covid-19 declaration under Section 564(b)(1) of the Act, 21  U.S.C. section 360bbb-3(b)(1), unless the authorization is  terminated or revoked. Performed at [facility], [address] [place], [place] [address]      Labs: BNP (last 3 results) Recent Labs    06/25/19 1353 06/26/19 1056 06/27/19 1059  BNP 2,820.0* 3,147.0* 5,681.2*   Basic Metabolic Panel: Recent Labs  Lab  06/30/19 7793 07/01/19 0637 07/02/19 0531 07/03/19 0510 07/04/19 0457  NA 136 136 133* 137 132*  K 4.4 4.4 4.4 4.6 4.7  CL 103 102 97* 98 99  CO2 25 25 26 29 26   GLUCOSE 147* 137* 129* 152* 185*  BUN 41* 44* 47* 49* 54*  CREATININE 1.89* 1.97* 1.97* 2.05* 2.10*  CALCIUM 8.7* 8.7* 8.9 9.1 8.5*  MG 2.0 2.1 2.2 2.5* 2.6*   Liver Function Tests: Recent Labs  Lab 06/27/19 1059  AST 15  ALT 12  ALKPHOS 94  BILITOT 0.9  PROT 7.4  ALBUMIN 3.3*   No results for input(s): LIPASE, AMYLASE in the last 168 hours. No results for input(s): AMMONIA in the last 168 hours. CBC: Recent Labs  Lab 06/27/19 1059 07/04/19 0457  WBC 6.1 7.3  NEUTROABS 3.5  --   HGB 10.1* 11.4*  HCT 32.2* 34.1*  MCV 89.9 85.0  PLT 207 188   Cardiac Enzymes: No results for input(s): CKTOTAL, CKMB, CKMBINDEX, TROPONINI in the last 168 hours. BNP: Invalid input(s): POCBNP CBG: Recent Labs  Lab 07/03/19 0741 07/03/19 1141 07/03/19 1634 07/03/19 2145 07/04/19 0740  GLUCAP 136* 154* 193* 180* 155*   D-Dimer No results for input(s): DDIMER in the last 72 hours. Hgb A1c No results for input(s): HGBA1C in the last 72 hours. Lipid Profile No results for input(s):  CHOL, HDL, LDLCALC, TRIG, CHOLHDL, LDLDIRECT in the last 72 hours. Thyroid function studies No results for input(s): TSH, T4TOTAL, T3FREE, THYROIDAB in the last 72 hours.  Invalid input(s): FREET3 Anemia work up No results for input(s): VITAMINB12, FOLATE, FERRITIN, TIBC, IRON, RETICCTPCT in the last 72 hours. Urinalysis No results found for: COLORURINE, APPEARANCEUR, [place], [place], [place], [place], [place], [place], PROTEINUR, UROBILINOGEN, NITRITE, LEUKOCYTESUR Sepsis Labs Invalid input(s): PROCALCITONIN,  WBC,  LACTICIDVEN Microbiology Recent Results (from the past 240 hour(s))  Respiratory Panel by RT PCR (Flu A&B, Covid) - Nasopharyngeal Swab     Status: None   Collection Time: 06/27/19  2:43 PM   Specimen: Nasopharyngeal Swab  Result Value Ref Range Status   SARS Coronavirus 2 by RT PCR NEGATIVE NEGATIVE Final    Comment: (NOTE) SARS-CoV-2 target nucleic acids are NOT DETECTED. The SARS-CoV-2 RNA is generally detectable in upper respiratoy specimens during the acute phase of infection. The lowest concentration of SARS-CoV-2 viral copies this assay can detect is 131 copies/mL. A negative result does not preclude SARS-Cov-2 infection and should not be used as the sole basis for treatment or other patient management decisions. A negative result may occur with  improper specimen collection/handling, submission of specimen other than nasopharyngeal swab, presence of viral mutation(s) within the areas targeted by this assay, and inadequate number of viral copies (<131 copies/mL). A negative result must be combined with clinical observations, patient history, and epidemiological information. The expected result is Negative. Fact Sheet for Patients:  PinkCheek.be Fact Sheet for Healthcare Providers:  GravelBags.it This test is not yet ap proved or cleared by the [place] FDA and  has been authorized for  detection and/or diagnosis of SARS-CoV-2 by FDA under an Emergency Use Authorization (EUA). This EUA will remain  in effect (meaning this test can be used) for the duration of the COVID-19 declaration under Section 564(b)(1) of the Act, 21 U.S.C. section 360bbb-3(b)(1), unless the authorization is terminated or revoked sooner.    Influenza A by PCR NEGATIVE NEGATIVE Final   Influenza B by PCR NEGATIVE NEGATIVE Final    Comment: (NOTE) The Xpert Xpress SARS-CoV-2/FLU/RSV  assay is intended as an aid in  the diagnosis of influenza from Nasopharyngeal swab specimens and  should not be used as a sole basis for treatment. Nasal washings and  aspirates are unacceptable for Xpert Xpress SARS-CoV-2/FLU/RSV  testing. Fact Sheet for Patients: PinkCheek.be Fact Sheet for Healthcare Providers: GravelBags.it This test is not yet approved or cleared by the [place] FDA and  has been authorized for detection and/or diagnosis of SARS-CoV-2 by  FDA under an Emergency Use Authorization (EUA). This EUA will remain  in effect (meaning this test can be used) for the duration of the  Covid-19 declaration under Section 564(b)(1) of the Act, 21  U.S.C. section 360bbb-3(b)(1), unless the authorization is  terminated or revoked. Performed at [facility], [address] [place], [place] [address]     Time coordinating discharge: Over 30 minutes  SIGNED:  Lorella Nimrod, MD  [facility] 07/04/2019, 10:46 AM  If 7PM-7AM, please contact night-coverage [email or web address]  This record has been created using Systems analyst. Errors have been sought and corrected,but may not always be located. Such creation errors do not reflect on the standard of care.

## 2019-07-08 ENCOUNTER — Telehealth: Payer: Self-pay | Admitting: Family

## 2019-07-08 NOTE — Telephone Encounter (Signed)
Patient is doing ok since he got out of hospital and confirmed his follow up/ CHF clinic appointment. He is checking his weight daily at home, following a low sodium diet, taking meds as prescribed and trying to stay active. He has no complaints currently.   Alyse Low, [place]

## 2019-07-09 NOTE — Progress Notes (Signed)
Patient ID: Shawn Hayes, male    DOB: Nov 29, 1955, 64 y.o.   MRN: [id number]  HPI  Mr Streight is a 64 y/o male with a history of DM, anemia, HTN, CKD, COPD, current tobacco use and chronic heart failure.   Echo report from 04/18/19 reviewed and showed an EF of 30-35% along with trivial MR and moderately elevated PA pressure.  Admitted 06/27/19 due to acute on chronic HF after failing outpatient diuresis. Initially placed on lasix drip and then transitioned to oral diuretics. Mildly elevated troponin thought to be due to demand ischemia. Discharged after 7 days. Admitted 04/17/19 due to acute on chronic heart failure with anasarca. Initially given IV lasix and then transitioned to oral diuretics. Spironolactone was held due to hyperkalemia. Given prednisone for COPD exacerbation. Discharged after 6 days.   He presents today for a follow-up visit with a chief complaint of minimal fatigue upon moderate exertion. He describes this as chronic in nature having been present for several years but is improving. He has associated pedal edema (much better), constipation and neuropathy in legs along with this. He denies any difficulty sleeping, dizziness, abdominal distention, palpitations, chest pain, shortness of breath or cough.   Says that his weight is gradually going up but he feels like a lot of this is related to constipation. He says that it's been 3 days since he last had a bowel movement. Going to see his PCP later today.    Past Medical History:  Diagnosis Date  . Anemia   . BPH (benign prostatic hyperplasia)   . CHF (congestive heart failure) ([place])   . Chronic kidney disease   . Controlled type 2 diabetes mellitus without complication ([place])   . COPD (chronic obstructive pulmonary disease) ([place])   . Hypertension   . Idiopathic cardiomyopathy ([place])    Past Surgical History:  Procedure Laterality Date  . APPENDECTOMY    . TONSILLECTOMY     Family History  Problem Relation Age of Onset   . Cancer Brother    Social History   Tobacco Use  . Smoking status: Former Smoker    Types: Cigarettes  . Smokeless tobacco: Never Used  . Tobacco comment: Pt states he quit 1 week ago  Substance Use Topics  . Alcohol use: Not Currently   No Known Allergies  Prior to Admission medications   Medication Sig Start Date End Date Taking? Authorizing Provider  albuterol (VENTOLIN HFA) 108 (90 Base) MCG/ACT inhaler Inhale 2 puffs into the lungs every 6 (six) hours as needed for wheezing or shortness of breath. 04/23/19  Yes [name], [name], MD  budesonide-formoterol [facility]) 160-4.5 MCG/ACT inhaler Inhale 2 puffs into the lungs in the morning and at bedtime. 04/23/19  Yes [name], MD  carvedilol (COREG) 12.5 MG tablet Take 1 tablet (12.5 mg total) by mouth 2 (two) times daily with a meal. 07/04/19  Yes Lorella Nimrod, MD  glipiZIDE (GLUCOTROL) 5 MG tablet Take 1 tablet (5 mg total) by mouth 2 (two) times daily. 04/19/19 04/18/20 Yes [name], [name], MD  metFORMIN (GLUCOPHAGE-XR) 500 MG 24 hr tablet Take 500 mg by mouth daily.    Yes [provider]  Multiple Vitamin (MULTIVITAMIN WITH MINERALS) TABS tablet Take 1 tablet by mouth daily.   Yes [provider]  Omega-3 Fatty Acids (FISH OIL) 1000 MG CAPS Take 1 g by mouth daily.   Yes [provider]  rOPINIRole (REQUIP) 0.5 MG tablet Take 1 tablet (0.5 mg total) by mouth at  bedtime. 04/19/19  Yes [name], [name], MD  sacubitril-valsartan (ENTRESTO) 24-26 MG Take 1 tablet by mouth 2 (two) times daily. 04/30/19  Yes Darylene Price A, FNP  tamsulosin (FLOMAX) 0.4 MG CAPS capsule Take 1 capsule (0.4 mg total) by mouth daily. 04/23/19  Yes [name], MD  tiotropium (SPIRIVA) 18 MCG inhalation capsule Place 1 capsule (18 mcg total) into inhaler and inhale daily. 04/23/19  Yes [name], [name], MD  torsemide (DEMADEX) 20 MG tablet Take 2 tablets (40 mg total) by mouth daily. 07/04/19  Yes Lorella Nimrod, MD      Review of Systems  Constitutional: Positive for fatigue. Negative for appetite change.  HENT: Negative for congestion, postnasal drip and sore throat.   Eyes: Negative.   Respiratory: Negative for cough and shortness of breath.   Cardiovascular: Positive for leg swelling ("better"). Negative for chest pain and palpitations.  Gastrointestinal: Positive for constipation. Negative for abdominal distention, abdominal pain and diarrhea.  Endocrine: Negative.   Genitourinary: Negative.   Musculoskeletal: Negative for arthralgias, back pain and neck pain.  Skin: Negative.   Allergic/Immunologic: Negative.   Neurological: Positive for numbness (neuropathy in legs). Negative for dizziness and light-headedness.  Hematological: Negative for adenopathy. Does not bruise/bleed easily.  Psychiatric/Behavioral: Negative for dysphoric mood and sleep disturbance (sleeping on 1 pillow). The patient is not nervous/anxious.    Vitals:   07/11/19 1031 07/11/19 1110  BP: (!) 171/105 (!) 142/92  Pulse: 78   Resp: 18   SpO2: 100%   Weight: 204 lb (92.5 kg)   Height: 6\' 1"  (1.854 m)    Wt Readings from Last 3 Encounters:  07/11/19 204 lb (92.5 kg)  07/04/19 192 lb 1.6 oz (87.1 kg)  06/26/19 233 lb 2 oz (105.7 kg)   Lab Results  Component Value Date   CREATININE 2.10 (H) 07/04/2019   CREATININE 2.05 (H) 07/03/2019   CREATININE 1.97 (H) 07/02/2019    Physical Exam Vitals and nursing note reviewed.  Constitutional:      Appearance: He is well-developed.  HENT:     Head: Normocephalic and atraumatic.  Neck:     Vascular: No JVD.  Cardiovascular:     Rate and Rhythm: Normal rate and regular rhythm.  Pulmonary:     Effort: Pulmonary effort is normal. No respiratory distress.     Breath sounds: No rhonchi or rales.  Abdominal:     General: There is no distension.     Palpations: Abdomen is soft.     Tenderness: There is no abdominal tenderness.  Musculoskeletal:     Cervical back:  Normal range of motion and neck supple.     Right lower leg: No tenderness. Edema (1+ pitting) present.     Left lower leg: No tenderness. Edema (1+ pitting) present.  Skin:    General: Skin is warm and dry.  Neurological:     General: No focal deficit present.     Mental Status: He is alert and oriented to person, place, and time.  Psychiatric:        Mood and Affect: Mood normal.        Behavior: Behavior normal.     Assessment & Plan:  1: Chronic heart failure with reduced ejection fraction- - NYHA class II - euvolemic today - weighing daily; reminded to call for an overnight weight gain of >2 pounds or a weekly weight gain of >5 pounds - weight down 29 pounds from last visit here 2 weeks ago (after recent admission) - home  weight has gone up gradually but he says that he's constipated; going to talk with PCP about this later today - not adding salt to his food and is trying to follow a low sodium diet; sister is now cooking for him and doesn't cook with salt; did eat BBQ, pea salad and cake last night - note given for PCP to check BMP/ BNP/ CBC today; depending on results, will titrate up entresto at his next visit - saw cardiology (Fath) 05/07/19 - BNP 06/27/19 was 3153.0 - PharmD reconciled medications with the patient - advised him to take both his torsemide tablets in the morning instead of BID  2: HTN- - BP initially elevated (171/105) but markedly improved upon recheck with a manual cuff (142/92) - saw PCP Ouida Sills) 05/30/19 - BMP 07/04/19 reviewed and showed sodium 132 , potassium 4.7 , creatinine 2.1  and GFR 32  3: DM- - A1c 05/30/19 was 8.4% - fasting glucose at home today was 114  4: Lymphedema- - stage 2 - has worn compression socks in the past but says that the swelling continues - elevate his legs when sitting for long periods of time - does walk daily but edema persists - has heard from lymphapress compression [place] but is waiting to hear what his insurance  will pay   Medication bottles were reviewed.   Return in 1 month or sooner for any questions/problems before then.

## 2019-07-11 ENCOUNTER — Ambulatory Visit: Payer: Medicare HMO | Attending: Family | Admitting: Family

## 2019-07-11 ENCOUNTER — Encounter: Payer: Self-pay | Admitting: Family

## 2019-07-11 ENCOUNTER — Other Ambulatory Visit: Payer: Self-pay

## 2019-07-11 VITALS — BP 142/92 | HR 78 | Resp 18 | Ht 73.0 in | Wt 204.0 lb

## 2019-07-11 DIAGNOSIS — N183 Chronic kidney disease, stage 3 unspecified: Secondary | ICD-10-CM

## 2019-07-11 DIAGNOSIS — Z79899 Other long term (current) drug therapy: Secondary | ICD-10-CM | POA: Insufficient documentation

## 2019-07-11 DIAGNOSIS — I130 Hypertensive heart and chronic kidney disease with heart failure and stage 1 through stage 4 chronic kidney disease, or unspecified chronic kidney disease: Secondary | ICD-10-CM | POA: Insufficient documentation

## 2019-07-11 DIAGNOSIS — I10 Essential (primary) hypertension: Secondary | ICD-10-CM

## 2019-07-11 DIAGNOSIS — E1140 Type 2 diabetes mellitus with diabetic neuropathy, unspecified: Secondary | ICD-10-CM | POA: Diagnosis not present

## 2019-07-11 DIAGNOSIS — Z09 Encounter for follow-up examination after completed treatment for conditions other than malignant neoplasm: Secondary | ICD-10-CM | POA: Diagnosis not present

## 2019-07-11 DIAGNOSIS — N400 Enlarged prostate without lower urinary tract symptoms: Secondary | ICD-10-CM | POA: Diagnosis not present

## 2019-07-11 DIAGNOSIS — N189 Chronic kidney disease, unspecified: Secondary | ICD-10-CM | POA: Insufficient documentation

## 2019-07-11 DIAGNOSIS — Z7984 Long term (current) use of oral hypoglycemic drugs: Secondary | ICD-10-CM | POA: Diagnosis not present

## 2019-07-11 DIAGNOSIS — Z87891 Personal history of nicotine dependence: Secondary | ICD-10-CM | POA: Insufficient documentation

## 2019-07-11 DIAGNOSIS — Z7951 Long term (current) use of inhaled steroids: Secondary | ICD-10-CM | POA: Insufficient documentation

## 2019-07-11 DIAGNOSIS — I5022 Chronic systolic (congestive) heart failure: Secondary | ICD-10-CM | POA: Insufficient documentation

## 2019-07-11 DIAGNOSIS — E1122 Type 2 diabetes mellitus with diabetic chronic kidney disease: Secondary | ICD-10-CM | POA: Insufficient documentation

## 2019-07-11 DIAGNOSIS — J449 Chronic obstructive pulmonary disease, unspecified: Secondary | ICD-10-CM | POA: Diagnosis not present

## 2019-07-11 DIAGNOSIS — I890 Lymphedema, not elsewhere classified: Secondary | ICD-10-CM | POA: Insufficient documentation

## 2019-07-11 DIAGNOSIS — I429 Cardiomyopathy, unspecified: Secondary | ICD-10-CM | POA: Insufficient documentation

## 2019-07-11 NOTE — Patient Instructions (Addendum)
Continue weighing daily and call for an overnight weight gain of > 2 pounds or a weekly weight gain of >5 pounds.   Take both torsemide pills in the morning.   Per discharge summary, please ask Dr. Ouida Sills to check BMP/CBC/ BNP today

## 2019-07-11 NOTE — Progress Notes (Signed)
[facility] - PHARMACIST COUNSELING NOTE  ADHERENCE ASSESSMENT  Adherence strategy: Utilizes a pillbox at home.    Do you ever forget to take your medication? [x] Yes (1) [] No (0)  Do you ever skip doses due to side effects? [] Yes (1) [x] No (0)  Do you have trouble affording your medicines? [] Yes (1) [x] No (0)  Are you ever unable to pick up your medication due to transportation difficulties? [] Yes (1) [x] No (0)  Do you ever stop taking your medications because you don't believe they are helping? [] Yes (1) [x] No (0)  Total score 1   Recommendations given to patient about increasing adherence: Forgets to take medications about 1x/week due to busy lifestyle. Advised patient to set an alarm on his phone to remind him to take his medications.  Guideline-Directed Medical Therapy/Evidence Based Medicine  ACE/ARB/ARNI: Delene Loll 24-26 mg BID Beta Blocker: Carvedilol 12.5 mg BID Aldosterone Antagonist: None Diuretic: Torsemide 40 mg daily    SUBJECTIVE  HPI: Patient is a 64 y/o M with PMH as below who presents to CHF clinic for follow-up. He was recently hospitalized 4/29-5/6 for CHF exacerbation. Patient was 226 lbs on admission and discharged at 192 lbs. Carvedilol was reduced due to asymptomatic bradycardia.   Past Medical History:  Diagnosis Date  . Anemia   . BPH (benign prostatic hyperplasia)   . CHF (congestive heart failure) ([place])   . Chronic kidney disease   . Controlled type 2 diabetes mellitus without complication ([place])   . COPD (chronic obstructive pulmonary disease) ([place])   . Hypertension   . Idiopathic cardiomyopathy ([place])      OBJECTIVE   Vital signs: HR 78, BP 142/92, weight (pounds) 204 ECHO: Date 04/18/19, EF 30-35%, notes: LV global hypokinesis, grade 1 diastolic dysfunction. RV mildly enlarged. Moderately elevated PA systolic pressures. LA + RA mildly dilated. No significant valvular disease.  BMP Latest Ref Rng &  Units 07/04/2019 07/03/2019 07/02/2019  Glucose 70 - 99 mg/dL 185(H) 152(H) 129(H)  BUN 8 - 23 mg/dL 54(H) 49(H) 47(H)  Creatinine 0.61 - 1.24 mg/dL 2.10(H) 2.05(H) 1.97(H)  Sodium 135 - 145 mmol/L 132(L) 137 133(L)  Potassium 3.5 - 5.1 mmol/L 4.7 4.6 4.4  Chloride 98 - 111 mmol/L 99 98 97(L)  CO2 22 - 32 mmol/L 26 29 26   Calcium 8.9 - 10.3 mg/dL 8.5(L) 9.1 8.9    ASSESSMENT Patient is well appearing in no acute distress. He denies shortness of breath/edema. He endorses taking medications every day but does forget on occasion. Denies side effects of therapy. He has been taking torsemide 20 mg BID at home instead of 40 mg daily as prescribed. Reports he is not making that much urine with diuretic and is likely not reaching threshold with 20 mg dose. Counseled patient on taking two tablets (40 mg) once daily.    PLAN  1). CHF -Entresto 24-26 mg BID, carvedilol 12.5 mg BID, torsemide 40 mg daily -Addition of spironolactone limited by renal function -Spoke with provider - no changes to medications today with tentative plan to increase Entresto at next visit  2). Hypertension -Patient is hypertensive today -Checking BP at home 2-3x/week. Reports BP runs 140-160/>100 -Antihypertensive regimen includes Entresto 24-26 mg BID, carvedilol 12.5 mg BID, and torsemide 40 mg daily  3). T2DM, un-controlled -HgbA1c was 8.4% on 05/30/19 which had increased from 8% on 04/18/19 -Antihyperglycemic regimen includes metformin ER 500 mg daily, glipizide 5 mg BID -Counseled patient on taking antidiabetic medications with food to  mitigate hypoglycemia with glipizide and GI side effects with metformin -He checks BG in the AM and reports it runs between 100 - 200 g/dL. Denies hypoglycemia  4). CKD stage III -Likely secondary to diabetes and hypertension -Most recent eGFR has been between 30-40 mL/min  5). COPD -Former smoker. Endorses abstinence from smoking since 04/2019 -Symbicort 160-4.5 two puff BID,  tiotropium 18 mcg daily, albuterol HFA PRN -Endorses washing mouth out after using Symbicort  6). BPH -Endorses weak stream with urination -Tamsulosin 0.4 mg daily. He thinks he is out of this medication -Advised patient to follow-up with tamsulosin Rx with PCP   Time spent: 25 minutes  [place] Resident 07/11/2019 10:34 AM    Current Outpatient Medications:  .  albuterol (VENTOLIN HFA) 108 (90 Base) MCG/ACT inhaler, Inhale 2 puffs into the lungs every 6 (six) hours as needed for wheezing or shortness of breath., Disp: 18 g, Rfl: 0 .  budesonide-formoterol (SYMBICORT) 160-4.5 MCG/ACT inhaler, Inhale 2 puffs into the lungs in the morning and at bedtime., Disp: 1 Inhaler, Rfl: 0 .  carvedilol (COREG) 12.5 MG tablet, Take 1 tablet (12.5 mg total) by mouth 2 (two) times daily with a meal., Disp: 60 tablet, Rfl: 1 .  glipiZIDE (GLUCOTROL) 5 MG tablet, Take 1 tablet (5 mg total) by mouth 2 (two) times daily., Disp: 60 tablet, Rfl: 11 .  metFORMIN (GLUCOPHAGE-XR) 500 MG 24 hr tablet, Take 500 mg by mouth daily. , Disp: , Rfl:  .  Multiple Vitamin (MULTIVITAMIN WITH MINERALS) TABS tablet, Take 1 tablet by mouth daily., Disp: , Rfl:  .  omega-3 acid ethyl esters (LOVAZA) 1 g capsule, Take 1 g by mouth 2 (two) times daily., Disp: , Rfl:  .  rOPINIRole (REQUIP) 0.5 MG tablet, Take 1 tablet (0.5 mg total) by mouth at bedtime., Disp: 30 tablet, Rfl: 0 .  sacubitril-valsartan (ENTRESTO) 24-26 MG, Take 1 tablet by mouth 2 (two) times daily., Disp: 60 tablet, Rfl: 3 .  tamsulosin (FLOMAX) 0.4 MG CAPS capsule, Take 1 capsule (0.4 mg total) by mouth daily., Disp: 30 capsule, Rfl: 0 .  tiotropium (SPIRIVA) 18 MCG inhalation capsule, Place 1 capsule (18 mcg total) into inhaler and inhale daily. (Patient not taking: Reported on 06/27/2019), Disp: 30 capsule, Rfl: 12 .  torsemide (DEMADEX) 20 MG tablet, Take 2 tablets (40 mg total) by mouth daily., Disp: 60 tablet, Rfl: 0   COUNSELING  POINTS/CLINICAL PEARLS  Carvedilol (Goal: weight less than 85 kg is 25 mg BID, weight greater than 85 kg is 50 mg BID)  Patient should avoid activities requiring coordination until drug effects are realized, as drug may cause dizziness.  This drug may cause diarrhea, nausea, vomiting, arthralgia, back pain, myalgia, headache, vision disorder, erectile dysfunction, reduced libido, or fatigue.  Instruct patient to report signs/symptoms of adverse cardiovascular effects such as hypotension (especially in elderly patients), arrhythmias, syncope, palpitations, angina, or edema.  Drug may mask symptoms of hypoglycemia. Advise diabetic patients to carefully monitor blood sugar levels.  Patient should take drug with food.  Advise patient against sudden discontinuation of drug. Entresto (Goal: 97/103 mg twice daily)  Warn male patient to avoid pregnancy during therapy and to report a pregnancy to a physician.  Advise patient to report symptomatic hypotension.  Side effects may include hyperkalemia, cough, dizziness, or renal failure. Torsemide  Side effects may include excessive urination.  Tell patient to report symptoms of ototoxicity.  Instruct patient to report lightheadedness or syncope.  Warn patient  to avoid use of nonprescription NSAID products without first discussing it with their healthcare provider.  DRUGS TO AVOID IN HEART FAILURE  Drug or Class Mechanism  Analgesics . NSAIDs . COX-2 inhibitors . Glucocorticoids  Sodium and water retention, increased systemic vascular resistance, decreased response to diuretics   Diabetes Medications . Metformin . Thiazolidinediones o Rosiglitazone (Avandia) o Pioglitazone (Actos) . DPP4 Inhibitors o Saxagliptin (Onglyza) o Sitagliptin (Januvia)   Lactic acidosis Possible calcium channel blockade   Unknown  Antiarrhythmics . Class I  o Flecainide o Disopyramide . Class III o Sotalol . Other o Dronedarone  Negative inotrope,  proarrhythmic   Proarrhythmic, beta blockade  Negative inotrope  Antihypertensives . Alpha Blockers o Doxazosin . Calcium Channel Blockers o Diltiazem o Verapamil o Nifedipine . Central Alpha Adrenergics o Moxonidine . Peripheral Vasodilators o Minoxidil  Increases renin and aldosterone  Negative inotrope    Possible sympathetic withdrawal  Unknown  Anti-infective . Itraconazole . Amphotericin B  Negative inotrope Unknown  Hematologic . Anagrelide . Cilostazol   Possible inhibition of PD IV Inhibition of PD III causing arrhythmias  Neurologic/Psychiatric . Stimulants . Anti-Seizure Drugs o Carbamazepine o Pregabalin . Antidepressants o Tricyclics o Citalopram . Parkinsons o Bromocriptine o Pergolide o Pramipexole . Antipsychotics o Clozapine . Antimigraine o Ergotamine o Methysergide . Appetite suppressants . Bipolar o Lithium  Peripheral alpha and beta agonist activity  Negative inotrope and chronotrope Calcium channel blockade  Negative inotrope, proarrhythmic Dose-dependent QT prolongation  Excessive serotonin activity/valvular damage Excessive serotonin activity/valvular damage Unknown  IgE mediated hypersensitivy, calcium channel blockade  Excessive serotonin activity/valvular damage Excessive serotonin activity/valvular damage Valvular damage  Direct myofibrillar degeneration, adrenergic stimulation  Antimalarials . Chloroquine . Hydroxychloroquine Intracellular inhibition of lysosomal enzymes  Urologic Agents . Alpha Blockers o Doxazosin o Prazosin o Tamsulosin o Terazosin  Increased renin and aldosterone  Adapted from Page RL, et al. "Drugs That May Cause or Exacerbate Heart Failure: A Scientific Statement from the [place]." Circulation 2016; 198:K22-H79. DOI: 10.1161/CIR.0000000000000426   MEDICATION ADHERENCES TIPS AND STRATEGIES 1. Taking medication as prescribed improves patient outcomes in heart  failure (reduces hospitalizations, improves symptoms, increases survival) 2. Side effects of medications can be managed by decreasing doses, switching agents, stopping drugs, or adding additional therapy. Please let someone in the [facility] know if you have having bothersome side effects so we can modify your regimen. Do not alter your medication regimen without talking to [place].  3. Medication reminders can help patients remember to take drugs on time. If you are missing or forgetting doses you can try linking behaviors, using pill boxes, or an electronic reminder like an alarm on your phone or an app. Some people can also get automated phone calls as medication reminders.

## 2019-07-16 ENCOUNTER — Ambulatory Visit: Payer: Self-pay | Admitting: Family

## 2019-07-24 DIAGNOSIS — I890 Lymphedema, not elsewhere classified: Secondary | ICD-10-CM | POA: Diagnosis not present

## 2019-07-25 DIAGNOSIS — I429 Cardiomyopathy, unspecified: Secondary | ICD-10-CM | POA: Diagnosis not present

## 2019-08-12 DIAGNOSIS — I429 Cardiomyopathy, unspecified: Secondary | ICD-10-CM | POA: Diagnosis not present

## 2019-08-12 DIAGNOSIS — R42 Dizziness and giddiness: Secondary | ICD-10-CM | POA: Diagnosis not present

## 2019-08-12 DIAGNOSIS — I10 Essential (primary) hypertension: Secondary | ICD-10-CM | POA: Diagnosis not present

## 2019-08-12 DIAGNOSIS — Z720 Tobacco use: Secondary | ICD-10-CM | POA: Diagnosis not present

## 2019-08-12 DIAGNOSIS — E1122 Type 2 diabetes mellitus with diabetic chronic kidney disease: Secondary | ICD-10-CM | POA: Diagnosis not present

## 2019-08-12 DIAGNOSIS — I340 Nonrheumatic mitral (valve) insufficiency: Secondary | ICD-10-CM | POA: Diagnosis not present

## 2019-08-12 DIAGNOSIS — E119 Type 2 diabetes mellitus without complications: Secondary | ICD-10-CM | POA: Diagnosis not present

## 2019-08-12 DIAGNOSIS — N183 Chronic kidney disease, stage 3 unspecified: Secondary | ICD-10-CM | POA: Diagnosis not present

## 2019-08-12 DIAGNOSIS — I5020 Unspecified systolic (congestive) heart failure: Secondary | ICD-10-CM | POA: Diagnosis not present

## 2019-08-12 NOTE — Progress Notes (Signed)
Patient ID: Shawn Hayes, male    DOB: 04/28/1955, 64 y.o.   MRN: [id number]  HPI  Shawn Hayes is a 64 y/o male with a history of DM, anemia, HTN, CKD, COPD, current tobacco use and chronic heart failure.   Echo report from 04/18/19 reviewed and showed an EF of 30-35% along with trivial Shawn and moderately elevated PA pressure.  Admitted 06/27/19 due to acute on chronic HF after failing outpatient diuresis. Initially placed on lasix drip and then transitioned to oral diuretics. Mildly elevated troponin thought to be due to demand ischemia. Discharged after 7 days. Admitted 04/17/19 due to acute on chronic heart failure with anasarca. Initially given IV lasix and then transitioned to oral diuretics. Spironolactone was held due to hyperkalemia. Given prednisone for COPD exacerbation. Discharged after 6 days.   He presents today for a follow-up visit with a chief complaint of minimal shortness of breath upon moderate exertion. He says that this started just a little bit ago. He has associated fatigue and dizziness along with this. He denies any difficulty sleeping, abdominal distention, palpitations, pedal edema, chest pain or weight gain.   Says that he started flomax ~ 2 weeks ago and yesterday his BP was 140/100. This morning his glucose was 140 and he felt fine, went to [employer] to eat lunch and then subsequently started feeling dizzy, light-headed, short of breath and occasionally feeling like he was going to pass out. Denies taking any extra doses of medication by accident and says that he's been eating/ drinking like usual. Denies any tobacco, alcohol or drug use.   Past Medical History:  Diagnosis Date  . Anemia   . BPH (benign prostatic hyperplasia)   . CHF (congestive heart failure) ([place])   . Chronic kidney disease   . Controlled type 2 diabetes mellitus without complication ([place])   . COPD (chronic obstructive pulmonary disease) ([place])   . Hypertension   . Idiopathic cardiomyopathy  ([place])    Past Surgical History:  Procedure Laterality Date  . APPENDECTOMY    . TONSILLECTOMY     Family History  Problem Relation Age of Onset  . Cancer Brother    Social History   Tobacco Use  . Smoking status: Former Smoker    Types: Cigarettes  . Smokeless tobacco: Never Used  . Tobacco comment: Pt states he quit 1 week ago  Substance Use Topics  . Alcohol use: Not Currently   No Known Allergies  Prior to Admission medications   Medication Sig Start Date End Date Taking? Authorizing Provider  albuterol (VENTOLIN HFA) 108 (90 Base) MCG/ACT inhaler Inhale 2 puffs into the lungs every 6 (six) hours as needed for wheezing or shortness of breath. 04/23/19  Yes [name], [name], MD  budesonide-formoterol [facility]) 160-4.5 MCG/ACT inhaler Inhale 2 puffs into the lungs in the morning and at bedtime. 04/23/19  Yes [name], MD  carvedilol (COREG) 12.5 MG tablet Take 1 tablet (12.5 mg total) by mouth 2 (two) times daily with a meal. 07/04/19  Yes Lorella Nimrod, MD  glipiZIDE (GLUCOTROL) 5 MG tablet Take 1 tablet (5 mg total) by mouth 2 (two) times daily. 04/19/19 04/18/20 Yes [name], [name], MD  metFORMIN (GLUCOPHAGE-XR) 500 MG 24 hr tablet Take 500 mg by mouth daily.    Yes [provider]  Multiple Vitamin (MULTIVITAMIN WITH MINERALS) TABS tablet Take 1 tablet by mouth daily.   Yes [provider]  Omega-3 Fatty Acids (FISH OIL) 1000 MG CAPS Take 1 g by  mouth daily.   Yes [provider]  rOPINIRole (REQUIP) 0.5 MG tablet Take 1 tablet (0.5 mg total) by mouth at bedtime. 04/19/19  Yes [name], [name], MD  sacubitril-valsartan (ENTRESTO) 24-26 MG Take 1 tablet by mouth 2 (two) times daily. 04/30/19  Yes Darylene Price A, FNP  tamsulosin (FLOMAX) 0.4 MG CAPS capsule Take 1 capsule (0.4 mg total) by mouth daily. 04/23/19  Yes [name], MD  tiotropium (SPIRIVA) 18 MCG inhalation capsule Place 1 capsule (18 mcg total) into inhaler and inhale daily.  04/23/19  Yes [name], [name], MD  torsemide (DEMADEX) 20 MG tablet Take 2 tablets (40 mg total) by mouth daily. Patient taking differently: Take 40 mg by mouth.  07/04/19  Yes Lorella Nimrod, MD    Review of Systems  Constitutional: Positive for fatigue. Negative for appetite change.  HENT: Negative for congestion, postnasal drip and sore throat.   Eyes: Negative.   Respiratory: Positive for shortness of breath. Negative for cough.   Cardiovascular: Negative for chest pain, palpitations and leg swelling.  Gastrointestinal: Positive for constipation. Negative for abdominal distention, abdominal pain and diarrhea.  Endocrine: Negative.   Genitourinary: Negative.   Musculoskeletal: Negative for arthralgias, back pain and neck pain.  Skin: Negative.   Allergic/Immunologic: Negative.   Neurological: Positive for dizziness and numbness (neuropathy in legs). Negative for light-headedness.  Hematological: Negative for adenopathy. Does not bruise/bleed easily.  Psychiatric/Behavioral: Negative for dysphoric mood and sleep disturbance (sleeping on 1 pillow). The patient is not nervous/anxious.    Vitals:   08/13/19 1225 08/13/19 1240  BP: (!) 86/63 92/65  Pulse: 75   Resp: 18   SpO2: 97%   Weight: 198 lb 8 oz (90 kg)   Height: 6' (1.829 m)    Wt Readings from Last 3 Encounters:  08/13/19 198 lb 8 oz (90 kg)  07/11/19 204 lb (92.5 kg)  07/04/19 192 lb 1.6 oz (87.1 kg)   Lab Results  Component Value Date   CREATININE 2.10 (H) 07/04/2019   CREATININE 2.05 (H) 07/03/2019   CREATININE 1.97 (H) 07/02/2019    Physical Exam Vitals and nursing note reviewed.  Constitutional:      Appearance: He is well-developed.  HENT:     Head: Normocephalic and atraumatic.  Neck:     Vascular: No JVD.  Cardiovascular:     Rate and Rhythm: Normal rate and regular rhythm.  Pulmonary:     Effort: Pulmonary effort is normal. No respiratory distress.     Breath sounds: No rhonchi or rales.  Abdominal:      General: There is no distension.     Palpations: Abdomen is soft.     Tenderness: There is no abdominal tenderness.  Musculoskeletal:     Cervical back: Normal range of motion and neck supple.     Right lower leg: No tenderness. No edema.     Left lower leg: No tenderness. No edema.  Skin:    General: Skin is warm and dry.  Neurological:     General: No focal deficit present.     Mental Status: He is alert and oriented to person, place, and time.  Psychiatric:        Mood and Affect: Mood normal.        Behavior: Behavior normal.     Assessment & Plan:  1: Chronic heart failure with reduced ejection fraction- - NYHA class II - euvolemic today - weighing daily; reminded to call for an overnight weight gain of >2 pounds  or a weekly weight gain of >5 pounds - weight down 6 pounds from last visit here 1 month - not adding salt to his food and is trying to follow a low sodium diet; sister is now cooking for him and doesn't cook with salt; did eat [employer] earlier today - saw cardiology Minette Brine) 08/12/19 - BNP 06/27/19 was 3153.0  2: Hypotension- - BP initially low at 86/63 and then rechecked with manual cuff after drinking a cup of water was 92/65 and patient said he was feeling better with very little dizziness - since patient was feeling better and BP was improving, advised patient to check his BP later today and if it was still low to hold his entresto and then call me in the morning - discussed possibly decreasing his diuretic since his edema is now gone - saw PCP Ouida Sills) 07/11/19 - BMP 07/25/19 reviewed and showed sodium 135 , potassium 4.8 , creatinine 1.7 and GFR 41  3: DM- - A1c 05/30/19 was 8.4% - fasting glucose at home today was 140; rechecked in the office and nonfasting was 243 (had eaten quarter pounder at [employer] ~ 1 hour prior)  4: Lymphedema- - stage 2 - now has compression boots - lymphedema resolved on today's visit  Addendum: patient had left the  office in a wheelchair (which is how he normally comes) with a volunteer to take him back to the [facility] entrance. Volunteer returned with patient in less than 5 minutes as patient told the volunteer that he was feeling quite dizzy again and felt like he was going to pass out. Advised volunteer to take patient directly to the ED and patient is already checked into the ED.   Return in 3 months, sooner pending ED disposition.

## 2019-08-13 ENCOUNTER — Encounter: Payer: Self-pay | Admitting: Family

## 2019-08-13 ENCOUNTER — Ambulatory Visit: Payer: Medicare HMO | Admitting: Family

## 2019-08-13 ENCOUNTER — Observation Stay: Payer: Medicare HMO

## 2019-08-13 ENCOUNTER — Other Ambulatory Visit: Payer: Self-pay

## 2019-08-13 ENCOUNTER — Encounter: Payer: Self-pay | Admitting: Medical Oncology

## 2019-08-13 ENCOUNTER — Observation Stay
Admission: EM | Admit: 2019-08-13 | Discharge: 2019-08-14 | Disposition: A | Discharge status: Home / Self Care | Payer: Medicare HMO | Attending: Internal Medicine | Admitting: Internal Medicine

## 2019-08-13 VITALS — BP 92/65 | HR 75 | Resp 18 | Ht 72.0 in | Wt 198.5 lb

## 2019-08-13 DIAGNOSIS — G2581 Restless legs syndrome: Secondary | ICD-10-CM | POA: Diagnosis not present

## 2019-08-13 DIAGNOSIS — Z20822 Contact with and (suspected) exposure to covid-19: Secondary | ICD-10-CM | POA: Diagnosis not present

## 2019-08-13 DIAGNOSIS — I959 Hypotension, unspecified: Secondary | ICD-10-CM | POA: Insufficient documentation

## 2019-08-13 DIAGNOSIS — I5022 Chronic systolic (congestive) heart failure: Secondary | ICD-10-CM

## 2019-08-13 DIAGNOSIS — I429 Cardiomyopathy, unspecified: Secondary | ICD-10-CM | POA: Insufficient documentation

## 2019-08-13 DIAGNOSIS — R55 Syncope and collapse: Secondary | ICD-10-CM | POA: Diagnosis not present

## 2019-08-13 DIAGNOSIS — R918 Other nonspecific abnormal finding of lung field: Secondary | ICD-10-CM | POA: Diagnosis not present

## 2019-08-13 DIAGNOSIS — Z79899 Other long term (current) drug therapy: Secondary | ICD-10-CM | POA: Insufficient documentation

## 2019-08-13 DIAGNOSIS — D631 Anemia in chronic kidney disease: Secondary | ICD-10-CM | POA: Diagnosis not present

## 2019-08-13 DIAGNOSIS — N401 Enlarged prostate with lower urinary tract symptoms: Secondary | ICD-10-CM | POA: Diagnosis not present

## 2019-08-13 DIAGNOSIS — I951 Orthostatic hypotension: Principal | ICD-10-CM | POA: Diagnosis present

## 2019-08-13 DIAGNOSIS — E1122 Type 2 diabetes mellitus with diabetic chronic kidney disease: Secondary | ICD-10-CM | POA: Diagnosis present

## 2019-08-13 DIAGNOSIS — I890 Lymphedema, not elsewhere classified: Secondary | ICD-10-CM | POA: Insufficient documentation

## 2019-08-13 DIAGNOSIS — I5042 Chronic combined systolic (congestive) and diastolic (congestive) heart failure: Secondary | ICD-10-CM | POA: Diagnosis not present

## 2019-08-13 DIAGNOSIS — Z87891 Personal history of nicotine dependence: Secondary | ICD-10-CM | POA: Insufficient documentation

## 2019-08-13 DIAGNOSIS — Z7951 Long term (current) use of inhaled steroids: Secondary | ICD-10-CM | POA: Insufficient documentation

## 2019-08-13 DIAGNOSIS — N1832 Chronic kidney disease, stage 3b: Secondary | ICD-10-CM | POA: Insufficient documentation

## 2019-08-13 DIAGNOSIS — I130 Hypertensive heart and chronic kidney disease with heart failure and stage 1 through stage 4 chronic kidney disease, or unspecified chronic kidney disease: Secondary | ICD-10-CM | POA: Insufficient documentation

## 2019-08-13 DIAGNOSIS — I110 Hypertensive heart disease with heart failure: Secondary | ICD-10-CM | POA: Diagnosis not present

## 2019-08-13 DIAGNOSIS — I428 Other cardiomyopathies: Secondary | ICD-10-CM | POA: Insufficient documentation

## 2019-08-13 DIAGNOSIS — D649 Anemia, unspecified: Secondary | ICD-10-CM

## 2019-08-13 DIAGNOSIS — J449 Chronic obstructive pulmonary disease, unspecified: Secondary | ICD-10-CM | POA: Diagnosis present

## 2019-08-13 DIAGNOSIS — N183 Chronic kidney disease, stage 3 unspecified: Secondary | ICD-10-CM

## 2019-08-13 DIAGNOSIS — I10 Essential (primary) hypertension: Secondary | ICD-10-CM | POA: Diagnosis not present

## 2019-08-13 DIAGNOSIS — N189 Chronic kidney disease, unspecified: Secondary | ICD-10-CM | POA: Insufficient documentation

## 2019-08-13 DIAGNOSIS — I509 Heart failure, unspecified: Secondary | ICD-10-CM

## 2019-08-13 DIAGNOSIS — R42 Dizziness and giddiness: Secondary | ICD-10-CM | POA: Diagnosis not present

## 2019-08-13 DIAGNOSIS — N400 Enlarged prostate without lower urinary tract symptoms: Secondary | ICD-10-CM | POA: Insufficient documentation

## 2019-08-13 DIAGNOSIS — Z7984 Long term (current) use of oral hypoglycemic drugs: Secondary | ICD-10-CM | POA: Insufficient documentation

## 2019-08-13 DIAGNOSIS — E119 Type 2 diabetes mellitus without complications: Secondary | ICD-10-CM

## 2019-08-13 LAB — BASIC METABOLIC PANEL
Anion gap: 10 (ref 5–15)
BUN: 42 mg/dL — ABNORMAL HIGH (ref 8–23)
CO2: 25 mmol/L (ref 22–32)
Calcium: 8.6 mg/dL — ABNORMAL LOW (ref 8.9–10.3)
Chloride: 100 mmol/L (ref 98–111)
Creatinine, Ser: 2.36 mg/dL — ABNORMAL HIGH (ref 0.61–1.24)
GFR calc Af Amer: 32 mL/min — ABNORMAL LOW (ref >60)
GFR calc non Af Amer: 28 mL/min — ABNORMAL LOW (ref >60)
Glucose, Bld: 272 mg/dL — ABNORMAL HIGH (ref 70–99)
Potassium: 4.6 mmol/L (ref 3.5–5.1)
Sodium: 135 mmol/L (ref 135–145)

## 2019-08-13 LAB — CBC
HCT: 31.4 % — ABNORMAL LOW (ref 39.0–52.0)
Hemoglobin: 10.6 g/dL — ABNORMAL LOW (ref 13.0–17.0)
MCH: 28.8 pg (ref 26.0–34.0)
MCHC: 33.8 g/dL (ref 30.0–36.0)
MCV: 85.3 fL (ref 80.0–100.0)
Platelets: 190 10*3/uL (ref 150–400)
RBC: 3.68 MIL/uL — ABNORMAL LOW (ref 4.22–5.81)
RDW: 14.1 % (ref 11.5–15.5)
WBC: 8 10*3/uL (ref 4.0–10.5)
nRBC: 0 % (ref 0.0–0.2)

## 2019-08-13 LAB — URINALYSIS, COMPLETE (UACMP) WITH MICROSCOPIC
Bacteria, UA: NONE SEEN
Bilirubin Urine: NEGATIVE
Glucose, UA: 50 mg/dL — AB
Ketones, ur: NEGATIVE mg/dL
Leukocytes,Ua: NEGATIVE
Nitrite: NEGATIVE
Protein, ur: 100 mg/dL — AB
Specific Gravity, Urine: 1.009 (ref 1.005–1.030)
Squamous Epithelial / HPF: NONE SEEN (ref 0–5)
pH: 6 (ref 5.0–8.0)

## 2019-08-13 LAB — HEPATIC FUNCTION PANEL
ALT: 12 U/L (ref 0–44)
AST: 17 U/L (ref 15–41)
Albumin: 3.2 g/dL — ABNORMAL LOW (ref 3.5–5.0)
Alkaline Phosphatase: 67 U/L (ref 38–126)
Bilirubin, Direct: <0.1 mg/dL (ref 0.0–0.2)
Total Bilirubin: 0.7 mg/dL (ref 0.3–1.2)
Total Protein: 7 g/dL (ref 6.5–8.1)

## 2019-08-13 LAB — CK: Total CK: 177 U/L (ref 49–397)

## 2019-08-13 LAB — CREATININE, URINE, RANDOM: Creatinine, Urine: 55 mg/dL

## 2019-08-13 LAB — TROPONIN I (HIGH SENSITIVITY): Troponin I (High Sensitivity): 18 ng/L — ABNORMAL HIGH (ref <18)

## 2019-08-13 LAB — GLUCOSE, CAPILLARY
Glucose-Capillary: 238 mg/dL — ABNORMAL HIGH (ref 70–99)
Glucose-Capillary: 100 mg/dL — ABNORMAL HIGH (ref 70–99)

## 2019-08-13 LAB — MAGNESIUM: Magnesium: 1.6 mg/dL — ABNORMAL LOW (ref 1.7–2.4)

## 2019-08-13 LAB — SODIUM, URINE, RANDOM: Sodium, Ur: 95 mmol/L

## 2019-08-13 LAB — SARS CORONAVIRUS 2 BY RT PCR (HOSPITAL ORDER, PERFORMED IN ~~LOC~~ HOSPITAL LAB): SARS Coronavirus 2: NEGATIVE

## 2019-08-13 MED ORDER — INSULIN ASPART 100 UNIT/ML ~~LOC~~ SOLN: 0.0000 [IU] | Freq: Every day | SUBCUTANEOUS | Status: DC

## 2019-08-13 MED ORDER — SODIUM CHLORIDE 0.9% FLUSH
3.0000 mL | Freq: Two times a day (BID) | INTRAVENOUS | Status: DC
  Administered 2019-08-14: 3 mL via INTRAVENOUS

## 2019-08-13 MED ORDER — SODIUM CHLORIDE 0.9 % IV BOLUS
500.0000 mL | Freq: Once | INTRAVENOUS | Status: AC
  Administered 2019-08-13: 500 mL via INTRAVENOUS

## 2019-08-13 MED ORDER — ACETAMINOPHEN 325 MG PO TABS: 650.0000 mg | ORAL_TABLET | Freq: Four times a day (QID) | ORAL | Status: DC | PRN

## 2019-08-13 MED ORDER — ALBUTEROL SULFATE (2.5 MG/3ML) 0.083% IN NEBU: 2.5000 mg | INHALATION_SOLUTION | RESPIRATORY_TRACT | Status: DC | PRN

## 2019-08-13 MED ORDER — ONDANSETRON HCL 4 MG/2ML IJ SOLN: 4.0000 mg | Freq: Four times a day (QID) | INTRAMUSCULAR | Status: DC | PRN

## 2019-08-13 MED ORDER — INSULIN ASPART 100 UNIT/ML ~~LOC~~ SOLN: 0.0000 [IU] | Freq: Three times a day (TID) | SUBCUTANEOUS | Status: DC

## 2019-08-13 MED ORDER — ALBUTEROL SULFATE HFA 108 (90 BASE) MCG/ACT IN AERS: 2.0000 | INHALATION_SPRAY | Freq: Four times a day (QID) | RESPIRATORY_TRACT | Status: DC | PRN

## 2019-08-13 MED ORDER — SODIUM CHLORIDE 0.9% FLUSH: 3.0000 mL | Freq: Once | INTRAVENOUS | Status: DC

## 2019-08-13 MED ORDER — SODIUM CHLORIDE 0.9 % IV SOLN: INTRAVENOUS | Status: DC

## 2019-08-13 MED ORDER — ROPINIROLE HCL 0.25 MG PO TABS
0.5000 mg | ORAL_TABLET | Freq: Every day | ORAL | Status: DC
  Administered 2019-08-13: 0.5 mg via ORAL
  Filled 2019-08-13 (×2): qty 2

## 2019-08-13 MED ORDER — ACETAMINOPHEN 650 MG RE SUPP: 650.0000 mg | Freq: Four times a day (QID) | RECTAL | Status: DC | PRN

## 2019-08-13 MED ORDER — MOMETASONE FURO-FORMOTEROL FUM 200-5 MCG/ACT IN AERO
2.0000 | INHALATION_SPRAY | Freq: Two times a day (BID) | RESPIRATORY_TRACT | Status: DC
  Administered 2019-08-13 – 2019-08-14 (×2): 2 via RESPIRATORY_TRACT
  Filled 2019-08-13: qty 8.8

## 2019-08-13 MED ORDER — ENOXAPARIN SODIUM 40 MG/0.4ML ~~LOC~~ SOLN
40.0000 mg | SUBCUTANEOUS | Status: DC
  Filled 2019-08-13: qty 0.4

## 2019-08-13 MED ORDER — ONDANSETRON HCL 4 MG PO TABS: 4.0000 mg | ORAL_TABLET | Freq: Four times a day (QID) | ORAL | Status: DC | PRN

## 2019-08-13 MED ORDER — DOCUSATE SODIUM 100 MG PO CAPS
100.0000 mg | ORAL_CAPSULE | Freq: Two times a day (BID) | ORAL | Status: DC
  Administered 2019-08-13: 100 mg via ORAL
  Filled 2019-08-13: qty 1

## 2019-08-13 MED ORDER — MAGNESIUM SULFATE IN D5W 1-5 GM/100ML-% IV SOLN
1.0000 g | Freq: Once | INTRAVENOUS | Status: AC
  Administered 2019-08-14: 1 g via INTRAVENOUS
  Filled 2019-08-13: qty 100

## 2019-08-13 NOTE — Progress Notes (Signed)
Anticoagulation monitoring(Lovenox):  64 yo male ordered Lovenox 30 mg Q24h  Filed Weights   08/13/19 1415  Weight: 90 kg (198 lb 6.6 oz)   BMI    Lab Results  Component Value Date   CREATININE 2.36 (H) 08/13/2019   CREATININE 2.10 (H) 07/04/2019   CREATININE 2.05 (H) 07/03/2019   Estimated Creatinine Clearance: 34.3 mL/min (A) (by C-G formula based on SCr of 2.36 mg/dL (H)). Hemoglobin & Hematocrit     Component Value Date/Time   HGB 10.6 (L) 08/13/2019 1417   HCT 31.4 (L) 08/13/2019 1417     Per Protocol for Patient with estCrcl > 30 ml/min and BMI > 40, will transition to Lovenox 40 mg Q24h.

## 2019-08-13 NOTE — ED Triage Notes (Signed)
Pt reports he was leaving heart care when he got dizzy and fell. Pt denies LOC. Denies injury from fall.

## 2019-08-13 NOTE — ED Provider Notes (Signed)
[facility] Emergency Department Provider Note  ____________________________________________   First MD Initiated Contact with Patient 08/13/19 1746     (approximate)  I have reviewed the triage vital signs and the nursing notes.   HISTORY  Chief Complaint Near Syncope    HPI Shawn Hayes is a 64 y.o. male with COPD, diabetes, hypertension, cardiomyopathy who comes in with near syncope.  Patient was leaving his heart care appointment when he started to feel dizzy and fell.  Patient states he has had these intermittent episodes of dizziness but mostly occur when he stands up or moves, better when he is sitting down, moderate in nature.  He stated that they initially were just a little bit of twinges and was seen by his cardiologist to let them know about this today I was told to just monitor his heart rates and blood pressures.  When he was going out to his car he went to go stand up out of the wheelchair and he immediately fell forward onto his car.  He did not lose consciousness but stated that he just felt dizzy and lost his balance.  He did not hit his head.  Denies any chest pain or shortness of breath or abdominal pain.  Patient was given 500 cc of fluid in triage and states that his symptoms are now better.   On review of last discharge summary patient was admitted for CHF and was diuresed from 2/20 6 pounds down to 192 pounds.  He states he has been compliant with his torsemide.          Past Medical History:  Diagnosis Date  . Anemia   . BPH (benign prostatic hyperplasia)   . CHF (congestive heart failure) ([place])   . Chronic kidney disease   . Controlled type 2 diabetes mellitus without complication ([place])   . COPD (chronic obstructive pulmonary disease) ([place])   . Hypertension   . Idiopathic cardiomyopathy [facility])     Patient Active Problem List   Diagnosis Date Noted  . Acute on chronic combined systolic and diastolic CHF (congestive heart  failure) ([place]) 06/27/2019  . Hyperkalemia   . Acute kidney injury superimposed on CKD ([place])   . COPD with acute exacerbation ([place])   . Benign prostatic hyperplasia with urinary hesitancy   . Essential hypertension   . Acute on chronic HFrEF (heart failure with reduced ejection fraction) ([place]) 04/18/2019  . Idiopathic cardiomyopathy ([place])   . Controlled type 2 diabetes mellitus without complication ([place])   . CKD stage 3 due to type 2 diabetes mellitus ([place])   . Weakness   . RLS (restless legs syndrome)   . Normocytic anemia 11/28/2018    Past Surgical History:  Procedure Laterality Date  . APPENDECTOMY    . TONSILLECTOMY      Prior to Admission medications   Medication Sig Start Date End Date Taking? Authorizing Provider  albuterol (VENTOLIN HFA) 108 (90 Base) MCG/ACT inhaler Inhale 2 puffs into the lungs every 6 (six) hours as needed for wheezing or shortness of breath. 04/23/19   [name], MD  budesonide-formoterol [facility]) 160-4.5 MCG/ACT inhaler Inhale 2 puffs into the lungs in the morning and at bedtime. 04/23/19   [name], MD  carvedilol (COREG) 12.5 MG tablet Take 1 tablet (12.5 mg total) by mouth 2 (two) times daily with a meal. 07/04/19   Lorella Nimrod, MD  glipiZIDE (GLUCOTROL) 5 MG tablet Take 1 tablet (5 mg total) by mouth 2 (two) times  daily. 04/19/19 04/18/20  [name], MD  metFORMIN (GLUCOPHAGE-XR) 500 MG 24 hr tablet Take 500 mg by mouth daily.     [provider]  Multiple Vitamin (MULTIVITAMIN WITH MINERALS) TABS tablet Take 1 tablet by mouth daily.    [provider]  Omega-3 Fatty Acids (FISH OIL) 1000 MG CAPS Take 1 g by mouth daily.    [provider]  rOPINIRole (REQUIP) 0.5 MG tablet Take 1 tablet (0.5 mg total) by mouth at bedtime. 04/19/19   [name], MD  sacubitril-valsartan (ENTRESTO) 24-26 MG Take 1 tablet by mouth 2 (two) times daily. 04/30/19   [name], FNP  tamsulosin (FLOMAX) 0.4 MG CAPS  capsule Take 1 capsule (0.4 mg total) by mouth daily. 04/23/19   [name], MD  tiotropium (SPIRIVA) 18 MCG inhalation capsule Place 1 capsule (18 mcg total) into inhaler and inhale daily. 04/23/19   [name], MD  torsemide (DEMADEX) 20 MG tablet Take 2 tablets (40 mg total) by mouth daily. Patient taking differently: Take 40 mg by mouth.  07/04/19   Lorella Nimrod, MD    Allergies Patient has no known allergies.  Family History  Problem Relation Age of Onset  . Cancer Brother     Social History Social History   Tobacco Use  . Smoking status: Former Smoker    Types: Cigarettes  . Smokeless tobacco: Never Used  . Tobacco comment: Pt states he quit 1 week ago  Substance Use Topics  . Alcohol use: Not Currently  . Drug use: Not Currently      Review of Systems Constitutional: No fever/chills Eyes: No visual changes. ENT: No sore throat. Cardiovascular: Denies chest pain. Respiratory: Denies shortness of breath. Gastrointestinal: No abdominal pain.  No nausea, no vomiting.  No diarrhea.  No constipation. Genitourinary: Negative for dysuria. Musculoskeletal: Negative for back pain. Skin: Negative for rash. Neurological: Negative for headaches, focal weakness or numbness. All other ROS negative ____________________________________________   PHYSICAL EXAM:  VITAL SIGNS: ED Triage Vitals  Enc Vitals Group     BP 08/13/19 1414 (!) 83/44     Pulse Rate 08/13/19 1414 78     Resp 08/13/19 1414 16     Temp 08/13/19 1414 98.2 F (36.8 C)     Temp Source 08/13/19 1414 Oral     SpO2 08/13/19 1414 97 %     Weight 08/13/19 1415 198 lb 6.6 oz (90 kg)     Height 08/13/19 1415 6' (1.829 m)     Head Circumference --      Peak Flow --      Pain Score 08/13/19 1415 0     Pain Loc --      Pain Edu? --      Excl. in [place]? --     Constitutional: Alert and oriented. Well appearing and in no acute distress. Eyes: Conjunctivae are normal. EOMI. Head: Atraumatic. Nose:  No congestion/rhinnorhea. Mouth/Throat: Mucous membranes are moist.   Neck: No stridor. Trachea Midline. FROM Cardiovascular: Normal rate, regular rhythm. Grossly normal heart sounds.  Good peripheral circulation. Respiratory: Normal respiratory effort.  No retractions. Lungs CTAB. Gastrointestinal: Soft and nontender. No distention. No abdominal bruits.  Musculoskeletal: No lower extremity tenderness nor edema.  No joint effusions. Neurologic:  Normal speech and language. No gross focal neurologic deficits are appreciated.  Finger-to-nose normal.  Cranial nerves II through XII are intact Skin:  Skin is warm, dry and intact. No rash noted. Psychiatric: Mood and affect are normal.  Speech and behavior are normal. GU: Deferred   ____________________________________________   LABS (all labs ordered are listed, but only abnormal results are displayed)  Labs Reviewed  BASIC METABOLIC PANEL - Abnormal; Notable for the following components:      Result Value   Glucose, Bld 272 (*)    BUN 42 (*)    Creatinine, Ser 2.36 (*)    Calcium 8.6 (*)    GFR calc non Af Amer 28 (*)    GFR calc Af Amer 32 (*)    All other components within normal limits  CBC - Abnormal; Notable for the following components:   RBC 3.68 (*)    Hemoglobin 10.6 (*)    HCT 31.4 (*)    All other components within normal limits  URINALYSIS, COMPLETE (UACMP) WITH MICROSCOPIC   ____________________________________________   ED ECG REPORT I, Vanessa Elmer, the attending physician, personally viewed and interpreted this ECG.  Normal sinus rate of 72, no ST elevation, T wave inversion in aVL, normal intervals ____________________________________________  RADIOLOGY Robert Bellow, personally viewed and evaluated these images (plain radiographs) as part of my medical decision making, as well as reviewing the written report by the radiologist.  ED MD interpretation: Some mild interstitial edema  Official radiology  report(s): DG Chest Port 1 View  Result Date: 08/13/2019 CLINICAL DATA:  Dizziness with fall EXAM: PORTABLE CHEST 1 VIEW COMPARISON:  Radiograph 06/27/2019 FINDINGS: Diffusely increased interstitial opacities with cephalized, indistinct pulmonary vascularity as well as some mild fissural thickening and few peripheral septal lines. No focal consolidative opacity. No pneumothorax or visible effusion. Cardiomediastinal contours are unremarkable for the portable technique. No acute osseous or soft tissue abnormality. Specifically, no visible displaced rib fractures or other acute traumatic osseous injuries. Degenerative changes are present in the imaged spine and shoulders. IMPRESSION: Features most compatible with mild CHF/volume overload with interstitial edema. No acute traumatic findings in the chest. Electronically Signed   By: Lovena Le M.D.   On: 08/13/2019 20:29    ____________________________________________   PROCEDURES  Procedure(s) performed (including Critical Care):  Procedures   ____________________________________________   INITIAL IMPRESSION / ASSESSMENT AND PLAN / ED COURSE  CERVANDO DURNIN was evaluated in Emergency Department on 08/13/2019 for the symptoms described in the history of present illness. He was evaluated in the context of the global COVID-19 pandemic, which necessitated consideration that the patient might be at risk for infection with the SARS-CoV-2 virus that causes COVID-19. Institutional protocols and algorithms that pertain to the evaluation of patients at risk for COVID-19 are in a state of rapid change based on information released by regulatory bodies including the CDC and federal and state organizations. These policies and algorithms were followed during the patient's care in the ED.     Patient is a 64 year old gentleman who comes in for syncope.  Concerning for orthostatic syncope and will give 500 cc of fluid and reevaluate.  Will get labs to  evaluate for AKI, electrolyte abnormalities.  Denies any chest pain to suggest ACS or shortness of breath to suggest PE.  No abdominal tenderness distress AAA.  Denies significant dizziness with sitting there and his finger-nose is normal so unlikely to be a stroke.  Patient states he did not fully pass out.  Did not hit his head to suggest intracranial hemorrhage.  Kidney function has been trending upward for the past month and is now at 2.36.  Sugars are slightly elevated but no anion gap elevation to suggest  DKA.  Hemoglobin is around baseline at 10.6.  No white count elevation to suggest infection  Even after the fluids patient still orthostatic where he drops from the 150s to 160s all the way down to the 80s.  Will be difficult to manage patient's orthostasis with his significant heart failure.  I did message Dr. Ubaldo Glassing so he was also aware of patient being here since followed by the [facility] clinic.  I discussed with hospital team for admission for close we monitoring his blood pressures in the setting of giving fluids for his orthostatic hypotension      ____________________________________________   FINAL CLINICAL IMPRESSION(S) / ED DIAGNOSES   Final diagnoses:  Chronic heart failure, unspecified heart failure type ([place])  Orthostatic hypotension  Near syncope      MEDICATIONS GIVEN DURING THIS VISIT:  Medications  sodium chloride flush (NS) 0.9 % injection 3 mL (3 mLs Intravenous Refused 08/13/19 1938)  insulin aspart (novoLOG) injection 0-5 Units (0 Units Subcutaneous Not Given 08/13/19 2056)  insulin aspart (novoLOG) injection 0-9 Units (has no administration in time range)  rOPINIRole (REQUIP) tablet 0.5 mg (has no administration in time range)  albuterol (VENTOLIN HFA) 108 (90 Base) MCG/ACT inhaler 2 puff (has no administration in time range)  mometasone-formoterol (DULERA) 200-5 MCG/ACT inhaler 2 puff (has no administration in time range)  sodium chloride flush (NS) 0.9 % injection  3 mL (3 mLs Intravenous Refused 08/13/19 2056)  acetaminophen (TYLENOL) tablet 650 mg (has no administration in time range)    Or  acetaminophen (TYLENOL) suppository 650 mg (has no administration in time range)  docusate sodium (COLACE) capsule 100 mg (has no administration in time range)  ondansetron (ZOFRAN) tablet 4 mg (has no administration in time range)    Or  ondansetron (ZOFRAN) injection 4 mg (has no administration in time range)  enoxaparin (LOVENOX) injection 30 mg (has no administration in time range)  0.9 %  sodium chloride infusion (has no administration in time range)  albuterol (PROVENTIL) (2.5 MG/3ML) 0.083% nebulizer solution 2.5 mg (has no administration in time range)  sodium chloride 0.9 % bolus 500 mL (0 mLs Intravenous Stopped 08/13/19 2043)  sodium chloride 0.9 % bolus 500 mL (0 mLs Intravenous Stopped 08/13/19 2043)     ED Discharge Orders    None       Note:  This document was prepared using Dragon voice recognition software and may include unintentional dictation errors.   Vanessa Marshall, MD 08/13/19 2059

## 2019-08-13 NOTE — H&P (Signed)
Shawn Hayes:[id number] DOB: 06-Dec-1955 DOA: 08/13/2019   PCP: [name], MD   Outpatient Specialists:  CARDS:   Dr.Fath, Dr. Clayborn Bigness    Patient arrived to ER on 08/13/19 at 1320  Patient coming from: home Lives alone,        Chief Complaint:   Chief Complaint  Patient presents with  . Near Syncope    HPI: Shawn Hayes is a 64 y.o. male with medical history significant of CHF EF 30% type 2 diabetes mellitus, hypertension, CKD stage IIIb,  nonischemic cardiomyopathy, BPH, anemia    Presented with hypotension and orthostasis, lightheadedness that resulted in a fall. Recently admitted for CHF exacerbation and was diuresed was able to be discharged in the beginning of May at patient's EF at baseline is 30%. His dry weight at the time of discharge was down to 192 pounds down from 226 on admission. Patient already on Entresto and Coreg.  His Coreg had to be decreased secondary to symptomatic bradycardia. Patient has chronically mildly elevated troponin.  Patient reports he had significant shortness of breath with ambulation but better at rest  For the past few days he has been having episodes of dizziness and today he was visiting his cardiologist when he felt lightheaded and fell down.  He has been having episodes similar to this every time he tries to stand up and is way better when he sits back down. He did let his cardiologist know about this and was recommended to monitor his blood pressure but when he was leaving an appointment he stood up from his wheelchair to get to his car and fell forward.  He did not completely lose consciousness but does report that he felt dizzy when he stood up and that resulted in the fall. No associated chest pain or shortness of breath no abdominal pain EMS administered 500 cc IV fluid and he has been feeling much better since.  Infectious risk factors:  Reports fatigue    Has  NOt been vaccinated against COVID (still thinking  about it)  In  ER   COVID TEST  NEGATIVE   Lab Results  Component Value Date   [place] 08/13/2019   [place] NEGATIVE 06/27/2019   [place] NEGATIVE 04/18/2019    Regarding pertinent Chronic problems:     HTN on Coreg   chronic CHF diastolic/systolic/ combined - last echo Feb 2021 EF 30% LV global hypokinesis, grade 1 diastolic dysfunction. RV mildly enlarged Entresto     DM 2 -  Lab Results  Component Value Date   HGBA1C 8.0 (H) 04/18/2019   on PO meds only      COPD - not  followed by pulmonology not on baseline oxygen     PF Readings from Last 1 Encounters:  No data found for PF   CKD stage III - baseline Cr 2.1 Lab Results  Component Value Date   CREATININE 2.36 (H) 08/13/2019   CREATININE 2.10 (H) 07/04/2019   CREATININE 2.05 (H) 07/03/2019    BPH - on Flomax,       While in ER: While in the emergency department patient noted to be persistently orthostatic Blood pressure down to 83/44 he was given gentle IV fluids and noted that he had some lower extremity edema.  When he got IV fluids.  Although still not short of breath and still somewhat orthostatic Given significantly confused EF and heart plan to admit gentle rehydration monitoring  ER Provider Called: Cardiology  Dr. Ubaldo Glassing  They Recommend admit to medicine   Will see in AM   Hospitalist was called for admission for orthostasis with presyncope in the setting of known history of CHF  The following Work up has been ordered so far:  Orders Placed This Encounter  Procedures  . Basic metabolic panel  . CBC  . Urinalysis, Complete w Microscopic  . Cardiac monitoring  . [place] Lock IV, Maintain IV access  . Orthostatic vital signs  . Consult to hospitalist  ALL PATIENTS BEING ADMITTED/HAVING PROCEDURES NEED COVID-19 SCREENING  . Pulse oximetry, continuous  . ED EKG    Following Medications were ordered in ER: Medications  sodium chloride flush (NS) 0.9 % injection 3 mL (has no  administration in time range)  sodium chloride 0.9 % bolus 500 mL (has no administration in time range)  sodium chloride 0.9 % bolus 500 mL (500 mLs Intravenous New Bag/Given 08/13/19 1425)        Consult Orders  (From admission, onward)         Start     Ordered   08/13/19 1856  Consult to hospitalist  ALL PATIENTS BEING ADMITTED/HAVING PROCEDURES NEED COVID-19 SCREENING  Once       Comments: ALL PATIENTS BEING ADMITTED/HAVING PROCEDURES NEED COVID-19 SCREENING  Provider:  (Not yet assigned)  Question Answer Comment  Place call to: [phone]   Reason for Consult Admit      08/13/19 1855           Significant initial  Findings: Abnormal Labs Reviewed  BASIC METABOLIC PANEL - Abnormal; Notable for the following components:      Result Value   Glucose, Bld 272 (*)    BUN 42 (*)    Creatinine, Ser 2.36 (*)    Calcium 8.6 (*)    GFR calc non Af Amer 28 (*)    GFR calc Af Amer 32 (*)    All other components within normal limits  CBC - Abnormal; Notable for the following components:   RBC 3.68 (*)    Hemoglobin 10.6 (*)    HCT 31.4 (*)    All other components within normal limits  URINALYSIS, COMPLETE (UACMP) WITH MICROSCOPIC - Abnormal; Notable for the following components:   Color, Urine STRAW (*)    APPearance CLEAR (*)    Glucose, UA 50 (*)    Hgb urine dipstick SMALL (*)    Protein, ur 100 (*)    All other components within normal limits  HEPATIC FUNCTION PANEL - Abnormal; Notable for the following components:   Albumin 3.2 (*)    All other components within normal limits  MAGNESIUM - Abnormal; Notable for the following components:   Magnesium 1.6 (*)    All other components within normal limits  GLUCOSE, CAPILLARY - Abnormal; Notable for the following components:   Glucose-Capillary 100 (*)    All other components within normal limits  TROPONIN I (HIGH SENSITIVITY) - Abnormal; Notable for the following components:   Troponin I (High Sensitivity) 18 (*)    All  other components within normal limits     Otherwise labs showing:    Recent Labs  Lab 08/13/19 1417  NA 135  K 4.6  CO2 25  GLUCOSE 272*  BUN 42*  CREATININE 2.36*  CALCIUM 8.6*    Cr  Up from baseline see below Lab Results  Component Value Date   CREATININE 2.36 (H) 08/13/2019   CREATININE 2.10 (H) 07/04/2019   CREATININE 2.05 (H)  07/03/2019    No results for input(s): Hayes, ALT, ALKPHOS, BILITOT, PROT, ALBUMIN in the last 168 hours. Lab Results  Component Value Date   CALCIUM 8.6 (L) 08/13/2019   WBC      Component Value Date/Time   WBC 8.0 08/13/2019 1417   ANC    Component Value Date/Time   NEUTROABS 3.5 06/27/2019 1059   ALC No components found for: LYMPHAB    Plt: Lab Results  Component Value Date   PLT 190 08/13/2019    COVID-19 Labs  No results for input(s): DDIMER, FERRITIN, LDH, CRP in the last 72 hours.  Lab Results  Component Value Date   SARSCOV2NAA NEGATIVE 06/27/2019   [place] NEGATIVE 04/18/2019     HG/HCT  stable,       Component Value Date/Time   HGB 10.6 (L) 08/13/2019 1417   HCT 31.4 (L) 08/13/2019 1417       ECG: Ordered Personally reviewed by me showing: HR : 72 Rhythm:  NSR,     no evidence of ischemic changes QTC 468   BNP (last 3 results) Recent Labs    06/25/19 1353 06/26/19 1056 06/27/19 1059  BNP 2,820.0* 3,147.0* 3,153.0*     DM  labs:  HbA1C: Recent Labs    04/18/19 0441  HGBA1C 8.0*       CBG (last 3)  Recent Labs    08/13/19 1243  GLUCAP 238*     UA   no evidence of UTI     Urine analysis:    Component Value Date/Time   COLORURINE STRAW (A) 08/13/2019 2200   APPEARANCEUR CLEAR (A) 08/13/2019 2200   LABSPEC 1.009 08/13/2019 2200   PHURINE 6.0 08/13/2019 2200   GLUCOSEU 50 (A) 08/13/2019 2200   HGBUR SMALL (A) 08/13/2019 2200   BILIRUBINUR NEGATIVE 08/13/2019 2200   KETONESUR NEGATIVE 08/13/2019 2200   PROTEINUR 100 (A) 08/13/2019 2200   NITRITE NEGATIVE 08/13/2019 2200     LEUKOCYTESUR NEGATIVE 08/13/2019 2200      Ordered   CXR -  Mild CHF     ED Triage Vitals  Enc Vitals Group     BP 08/13/19 1414 (!) 83/44     Pulse Rate 08/13/19 1414 78     Resp 08/13/19 1414 16     Temp 08/13/19 1414 98.2 F (36.8 C)     Temp Source 08/13/19 1414 Oral     SpO2 08/13/19 1414 97 %     Weight 08/13/19 1415 198 lb 6.6 oz (90 kg)     Height 08/13/19 1415 6' (1.829 m)     Head Circumference --      Peak Flow --      Pain Score 08/13/19 1415 0     Pain Loc --      Pain Edu? --      Excl. in [place]? --   TMAX(24)@       Latest  Blood pressure (!) 139/96, pulse 66, temperature 97.6 F (36.4 C), temperature source Oral, resp. rate 18, height 6' (1.829 m), weight 90 kg, SpO2 100 %.   Review of Systems:    Pertinent positives include:    fatigue  Constitutional:  No weight loss, night sweats, Fevers, chills,, weight loss  HEENT:  No headaches, Difficulty swallowing,Tooth/dental problems,Sore throat,  No sneezing, itching, ear ache, nasal congestion, post nasal drip,  Cardio-vascular:  No chest pain, Orthopnea, PND, anasarca, dizziness, palpitations.no Bilateral lower extremity swelling  GI:  No heartburn, indigestion, abdominal pain, nausea, vomiting,  diarrhea, change in bowel habits, loss of appetite, melena, blood in stool, hematemesis Resp:  no shortness of breath at rest. No dyspnea on exertion, No excess mucus, no productive cough, No non-productive cough, No coughing up of blood.No change in color of mucus.No wheezing. Skin:  no rash or lesions. No jaundice GU:  no dysuria, change in color of urine, no urgency or frequency. No straining to urinate.  No flank pain.  Musculoskeletal:  No joint pain or no joint swelling. No decreased range of motion. No back pain.  Psych:  No change in mood or affect. No depression or anxiety. No memory loss.  Neuro: no localizing neurological complaints, no tingling, no weakness, no double vision, no gait abnormality,  no slurred speech, no confusion  All systems reviewed and apart from [place] all are negative  Past Medical History:   Past Medical History:  Diagnosis Date  . Anemia   . BPH (benign prostatic hyperplasia)   . CHF (congestive heart failure) ([place])   . Chronic kidney disease   . Controlled type 2 diabetes mellitus without complication ([place])   . COPD (chronic obstructive pulmonary disease) ([place])   . Hypertension   . Idiopathic cardiomyopathy ([place])       Past Surgical History:  Procedure Laterality Date  . APPENDECTOMY    . TONSILLECTOMY      Social History:  Ambulatory         reports that he has quit smoking. His smoking use included cigarettes. He has never used smokeless tobacco. He reports previous alcohol use. He reports previous drug use.     Family History:   Family History  Problem Relation Age of Onset  . Cancer Brother     Allergies: No Known Allergies   Prior to Admission medications   Medication Sig Start Date End Date Taking? Authorizing Provider  albuterol (VENTOLIN HFA) 108 (90 Base) MCG/ACT inhaler Inhale 2 puffs into the lungs every 6 (six) hours as needed for wheezing or shortness of breath. 04/23/19   [name], MD  budesonide-formoterol [facility]) 160-4.5 MCG/ACT inhaler Inhale 2 puffs into the lungs in the morning and at bedtime. 04/23/19   [name], MD  carvedilol (COREG) 12.5 MG tablet Take 1 tablet (12.5 mg total) by mouth 2 (two) times daily with a meal. 07/04/19   Lorella Nimrod, MD  glipiZIDE (GLUCOTROL) 5 MG tablet Take 1 tablet (5 mg total) by mouth 2 (two) times daily. 04/19/19 04/18/20  [name], MD  metFORMIN (GLUCOPHAGE-XR) 500 MG 24 hr tablet Take 500 mg by mouth daily.     [provider]  Multiple Vitamin (MULTIVITAMIN WITH MINERALS) TABS tablet Take 1 tablet by mouth daily.    [provider]  Omega-3 Fatty Acids (FISH OIL) 1000 MG CAPS Take 1 g by mouth daily.    [provider]   rOPINIRole (REQUIP) 0.5 MG tablet Take 1 tablet (0.5 mg total) by mouth at bedtime. 04/19/19   [name], MD  sacubitril-valsartan (ENTRESTO) 24-26 MG Take 1 tablet by mouth 2 (two) times daily. 04/30/19   [name], FNP  tamsulosin (FLOMAX) 0.4 MG CAPS capsule Take 1 capsule (0.4 mg total) by mouth daily. 04/23/19   [name], MD  tiotropium (SPIRIVA) 18 MCG inhalation capsule Place 1 capsule (18 mcg total) into inhaler and inhale daily. 04/23/19   [name], MD  torsemide (DEMADEX) 20 MG tablet Take 2 tablets (40 mg total) by mouth daily. Patient taking differently: Take 40 mg by mouth.  07/04/19   Lorella Nimrod, MD   Physical Exam: Blood pressure (!) 139/96, pulse 66, temperature 97.6 F (36.4 C), temperature source Oral, resp. rate 18, height 6' (1.829 m), weight 90 kg, SpO2 100 %. 1. General:  in No Acute distress   Chronically ill  -appearing 2. Psychological: Alert and   Oriented 3. Head/ENT:    Dry Mucous Membranes                          Head Non traumatic, neck supple                           Poor Dentition 4. SKIN:    decreased Skin turgor,  Skin clean Dry and intact no rash 5. Heart: Regular rate and rhythm no Murmur, no Rub or gallop 6. Lungs:  , no wheezes or crackles   7. Abdomen: Soft,  non-tender, Non distended obese  bowel sounds present 8. Lower extremities: no clubbing, cyanosis, mild edema 9. Neurologically Grossly intact, moving all 4 extremities equally  10. MSK: Normal range of motion   All other LABS:     Recent Labs  Lab 08/13/19 1417  WBC 8.0  HGB 10.6*  HCT 31.4*  MCV 85.3  PLT 190     Recent Labs  Lab 08/13/19 1417  NA 135  K 4.6  CL 100  CO2 25  GLUCOSE 272*  BUN 42*  CREATININE 2.36*  CALCIUM 8.6*     Recent Labs  Lab 08/13/19 1417  Hayes 17  ALT 12  ALKPHOS 67  BILITOT 0.7  PROT 7.0  ALBUMIN 3.2*       Cultures: No results found for: SDES, SPECREQUEST, CULT, REPTSTATUS   Radiological Exams on  Admission: DG Chest Port 1 View  Result Date: 08/13/2019 CLINICAL DATA:  Dizziness with fall EXAM: PORTABLE CHEST 1 VIEW COMPARISON:  Radiograph 06/27/2019 FINDINGS: Diffusely increased interstitial opacities with cephalized, indistinct pulmonary vascularity as well as some mild fissural thickening and few peripheral septal lines. No focal consolidative opacity. No pneumothorax or visible effusion. Cardiomediastinal contours are unremarkable for the portable technique. No acute osseous or soft tissue abnormality. Specifically, no visible displaced rib fractures or other acute traumatic osseous injuries. Degenerative changes are present in the imaged spine and shoulders. IMPRESSION: Features most compatible with mild CHF/volume overload with interstitial edema. No acute traumatic findings in the chest. Electronically Signed   By: Lovena Le M.D.   On: 08/13/2019 20:29    Chart has been reviewed   Assessment/Plan  64 y.o. male with medical history significant of CHF EF 30% type 2 diabetes mellitus, hypertension, CKD stage IIIb,  nonischemic cardiomyopathy, BPH, anemia  Admitted for syncope likely secondary to orthostatic hypotension in the setting of cardiomyopathy and CHF with EF of 30-35%  Present on Admission: . Syncopal episodes -in the setting of orthostatic hypotension.  Admit for gently rehydration continue to follow fluid status avoid over rehydration Repeat echo cycle cardiac enzymes obtain carotid Dopplers for completion  . Orthostatic hypotension -hold torsemide for tonight gently rehydrate him initially in emergency department now given   slight lower extremity edema and chest x-ray showing some pulmonary congestion as well will hold off on further IV fluids Patient may benefit from education regarding orthostatic hypotension  appreciate cardiology input  . Normocytic anemia obtain anemia panel and follow currently appears to be stable  . CKD stage 3 due to type 2  diabetes mellitus  ([place]) avoid nephrotoxic medications and follow renal function, obtain urine electrolytes given slight bump in creatinine most likely secondary to slight overdiuresis  . Essential hypertension -hold home medications given hypotension hold Entresto  DM2 -  - Order SensitiveSSI   -  check TSH and HgA1C  - Hold by mouth medications   COPD -chronic stable continue home medications  Hypomagnesemia - will replaced  Hematuria- follow up with Urology as an outpatient no flank pain to suggest kidney stones  Other plan as per orders.  DVT prophylaxis:   Lovenox     Code Status:  FULL CODE   as per patient    Family Communication:   Family not at  Bedside    Disposition Plan:       To home once workup is complete and patient is stable    Following barriers for discharge:                           BP corrected                                                      Will need consultants to evaluate patient prior to discharge   EXPECT DC tomorrow                     Would benefit from PT/OT eval prior to DC  Ordered                                       Consults called:    Cardiology is aware  Admission status:  ED Disposition    ED Disposition Condition [place]: [place] [[id number]]  Level of Care: Progressive Cardiac [[id number]]  Admit to Progressive based on following criteria: CARDIOVASCULAR & THORACIC of moderate stability with acute coronary syndrome symptoms/low risk myocardial infarction/hypertensive urgency/arrhythmias/heart failure potentially compromising stability and stable post cardiovascular intervention patients.  Covid Evaluation: Asymptomatic Screening Protocol (No Symptoms)  Diagnosis: Orthostatic hypotension [458.0.ICD-9-CM]  Admitting Physician: Toy Baker [[id number]]  Attending Physician: Toy Baker [[id number]]        Obs    Level of care  progresive tele     Precautions: admitted as   Negative      PPE: Used  by the provider:  N95 eye Goggles,  Gloves      Abrea Henle 08/13/2019, 11:04 PM    [facility]     after 2 AM please page floor coverage PA If 7AM-7PM, please contact the day team taking care of the patient using [email or web address]   Patient was evaluated in the context of the global COVID-19 pandemic, which necessitated consideration that the patient might be at risk for infection with the SARS-CoV-2 virus that causes COVID-19. Institutional protocols and algorithms that pertain to the evaluation of patients at risk for COVID-19 are in a state of rapid change based on information released by regulatory bodies including the CDC and federal and state organizations. These policies and algorithms were followed during the patient's care.

## 2019-08-13 NOTE — ED Notes (Signed)
Patient states he started feeling a lot better @ 1630. BP has come up and patients is relaxing watching TV.

## 2019-08-13 NOTE — Patient Instructions (Signed)
Continue weighing daily and call for an overnight weight gain of > 2 pounds or a weekly weight gain of >5 pounds. 

## 2019-08-14 ENCOUNTER — Observation Stay: Payer: Medicare HMO

## 2019-08-14 DIAGNOSIS — E119 Type 2 diabetes mellitus without complications: Secondary | ICD-10-CM

## 2019-08-14 DIAGNOSIS — R55 Syncope and collapse: Secondary | ICD-10-CM

## 2019-08-14 DIAGNOSIS — I951 Orthostatic hypotension: Secondary | ICD-10-CM

## 2019-08-14 DIAGNOSIS — I959 Hypotension, unspecified: Secondary | ICD-10-CM | POA: Diagnosis not present

## 2019-08-14 LAB — CBC WITH DIFFERENTIAL/PLATELET
Abs Immature Granulocytes: 0.03 10*3/uL (ref 0.00–0.07)
Basophils Absolute: 0.1 10*3/uL (ref 0.0–0.1)
Basophils Relative: 1 %
Eosinophils Absolute: 0.9 10*3/uL — ABNORMAL HIGH (ref 0.0–0.5)
Eosinophils Relative: 13 %
HCT: 29.3 % — ABNORMAL LOW (ref 39.0–52.0)
Hemoglobin: 10.5 g/dL — ABNORMAL LOW (ref 13.0–17.0)
Immature Granulocytes: 0 %
Lymphocytes Relative: 31 %
Lymphs Abs: 2.2 10*3/uL (ref 0.7–4.0)
MCH: 29.2 pg (ref 26.0–34.0)
MCHC: 35.8 g/dL (ref 30.0–36.0)
MCV: 81.6 fL (ref 80.0–100.0)
Monocytes Absolute: 0.5 10*3/uL (ref 0.1–1.0)
Monocytes Relative: 8 %
Neutro Abs: 3.4 10*3/uL (ref 1.7–7.7)
Neutrophils Relative %: 47 %
Platelets: 151 10*3/uL (ref 150–400)
RBC: 3.59 MIL/uL — ABNORMAL LOW (ref 4.22–5.81)
RDW: 14.1 % (ref 11.5–15.5)
WBC: 7.1 10*3/uL (ref 4.0–10.5)
nRBC: 0 % (ref 0.0–0.2)

## 2019-08-14 LAB — IRON AND TIBC
Iron: 83 ug/dL (ref 45–182)
Saturation Ratios: 41 % — ABNORMAL HIGH (ref 17.9–39.5)
TIBC: 203 ug/dL — ABNORMAL LOW (ref 250–450)
UIBC: 120 ug/dL

## 2019-08-14 LAB — FOLATE: Folate: 7.6 ng/mL (ref >5.9)

## 2019-08-14 LAB — RETICULOCYTES
Immature Retic Fract: 6.4 % (ref 2.3–15.9)
RBC.: 3.56 MIL/uL — ABNORMAL LOW (ref 4.22–5.81)
Retic Count, Absolute: 44.9 10*3/uL (ref 19.0–186.0)
Retic Ct Pct: 1.3 % (ref 0.4–3.1)

## 2019-08-14 LAB — COMPREHENSIVE METABOLIC PANEL
ALT: 11 U/L (ref 0–44)
AST: 14 U/L — ABNORMAL LOW (ref 15–41)
Albumin: 3 g/dL — ABNORMAL LOW (ref 3.5–5.0)
Alkaline Phosphatase: 64 U/L (ref 38–126)
Anion gap: 9 (ref 5–15)
BUN: 41 mg/dL — ABNORMAL HIGH (ref 8–23)
CO2: 25 mmol/L (ref 22–32)
Calcium: 8.8 mg/dL — ABNORMAL LOW (ref 8.9–10.3)
Chloride: 104 mmol/L (ref 98–111)
Creatinine, Ser: 1.97 mg/dL — ABNORMAL HIGH (ref 0.61–1.24)
GFR calc Af Amer: 40 mL/min — ABNORMAL LOW (ref >60)
GFR calc non Af Amer: 35 mL/min — ABNORMAL LOW (ref >60)
Glucose, Bld: 160 mg/dL — ABNORMAL HIGH (ref 70–99)
Potassium: 4.1 mmol/L (ref 3.5–5.1)
Sodium: 138 mmol/L (ref 135–145)
Total Bilirubin: 0.6 mg/dL (ref 0.3–1.2)
Total Protein: 6.4 g/dL — ABNORMAL LOW (ref 6.5–8.1)

## 2019-08-14 LAB — PHOSPHORUS: Phosphorus: 4 mg/dL (ref 2.5–4.6)

## 2019-08-14 LAB — TROPONIN I (HIGH SENSITIVITY): Troponin I (High Sensitivity): 18 ng/L — ABNORMAL HIGH (ref <18)

## 2019-08-14 LAB — FERRITIN: Ferritin: 162 ng/mL (ref 24–336)

## 2019-08-14 LAB — VITAMIN B12: Vitamin B-12: 219 pg/mL (ref 180–914)

## 2019-08-14 LAB — TSH: TSH: 2.136 u[IU]/mL (ref 0.350–4.500)

## 2019-08-14 LAB — MAGNESIUM: Magnesium: 1.8 mg/dL (ref 1.7–2.4)

## 2019-08-14 LAB — GLUCOSE, CAPILLARY: Glucose-Capillary: 126 mg/dL — ABNORMAL HIGH (ref 70–99)

## 2019-08-14 MED ORDER — VALSARTAN 40 MG PO TABS
40.0000 mg | ORAL_TABLET | Freq: Every day | ORAL | 0 refills | Status: DC
Start: 2019-08-14 — End: 2019-12-17

## 2019-08-14 NOTE — Discharge Summary (Addendum)
Physician Discharge Summary  Shawn Hayes MGQ:[id number] DOB: 06-05-1955 DOA: 08/13/2019  PCP: [name], MD  Admit date: 08/13/2019 Discharge date: 08/14/2019  Admitted From: home Disposition:  Home  Recommendations for Outpatient Follow-up:  1. Follow up with PCP in 1-2 weeks 2. F/u cardio, Dr. Ubaldo Glassing, in 7-10 days  Home Health: no  Equipment/Devices:  Discharge Condition: stable  CODE STATUS: full  Diet recommendation: Heart Healthy / Carb Modified   Brief/Interim Summary: HPI was taken from Dr. Roel Cluck:  Shawn Hayes is a 64 y.o. male with medical history significant of CHF EF 30%type 2 diabetes mellitus, hypertension, CKD stage IIIb, nonischemic cardiomyopathy, BPH, anemia    Presented with hypotension and orthostasis, lightheadedness that resulted in a fall. Recently admitted for CHF exacerbation and was diuresed was able to be discharged in the beginning of May at patient's EF at baseline is 30%. His dry weight at the time of discharge was down to 192 pounds down from 226 on admission. Patient already on Entresto and Coreg.  His Coreg had to be decreased secondary to symptomatic bradycardia. Patient has chronically mildly elevated troponin.  Patient reports he had significant shortness of breath with ambulation but better at rest  For the past few days he has been having episodes of dizziness and today he was visiting his cardiologist when he felt lightheaded and fell down.  He has been having episodes similar to this every time he tries to stand up and is way better when he sits back down. He did let his cardiologist know about this and was recommended to monitor his blood pressure but when he was leaving an appointment he stood up from his wheelchair to get to his car and fell forward.  He did not completely lose consciousness but does report that he felt dizzy when he stood up and that resulted in the fall. No associated chest pain or shortness of breath  no abdominal pain EMS administered 500 cc IV fluid and he has been feeling much better since.  Infectious risk factors:  Reports fatigue    Has  NOt been vaccinated against COVID (still thinking about it)  In  ER   COVID TEST  NEGATIVE    Hospital Course from Dr. Lenise Herald 08/14/19: Pt presented after a syncopal episode which was likely secondary to orthostatic hypotension. The orthostatic hypotension resolved prior to d/c. Cardio was consulted and d/c entresto and recommended continue on carvedilol, torsemide. No further cardiac work-up was indicated as per cardio. US carotids was unremarkable. For more information, please see previous progress notes   Discharge Diagnoses:  Active Problems:   Normocytic anemia   Controlled type 2 diabetes mellitus without complication ([place])   CKD stage 3 due to type 2 diabetes mellitus (HCC)   Essential hypertension   Orthostatic hypotension   Syncopal episodes   COPD (chronic obstructive pulmonary disease) (HCC)   Hypomagnesemia  Syncope: in the setting of orthostatic hypotension. Improved w/ IVFs. US carotids shows unremarkable carotid doppler ultrasound   Orthostatic hypotension: resolved  Normocytic anemia: no need for a transfusion currently. Will continue to monitor   CKDIIIb: baseline Cr is unknown. Cr is trending down. Will continue to monitor   Essential hypertension : restart home dose of carvedilol, torsemide   DM2: controlled. Continue on SSI w/ accuchecks   COPD: w/o exacerbation.   Hypomagnesemia: WNL today   Hematuria: follow up with Urology as an outpatient no flank pain to suggest kidney stones    Discharge  Instructions  Discharge Instructions    Diet - low sodium heart healthy   Complete by: As directed    Diet Carb Modified   Complete by: As directed    Discharge instructions   Complete by: As directed    F/u PCP in 2 weeks. F/u cardio (Dr. Ubaldo Glassing) in 7-10 days   Increase activity slowly   Complete  by: As directed      Allergies as of 08/14/2019   No Known Allergies     Medication List    STOP taking these medications   sacubitril-valsartan 24-26 MG Commonly known as: ENTRESTO     TAKE these medications   albuterol 108 (90 Base) MCG/ACT inhaler Commonly known as: VENTOLIN HFA Inhale 2 puffs into the lungs every 6 (six) hours as needed for wheezing or shortness of breath.   budesonide-formoterol 160-4.5 MCG/ACT inhaler Commonly known as: Symbicort Inhale 2 puffs into the lungs in the morning and at bedtime.   carvedilol 12.5 MG tablet Commonly known as: COREG Take 1 tablet (12.5 mg total) by mouth 2 (two) times daily with a meal.   Fish Oil 1000 MG Caps Take 1 g by mouth daily.   glipiZIDE 5 MG tablet Commonly known as: GLUCOTROL Take 1 tablet (5 mg total) by mouth 2 (two) times daily.   metFORMIN 500 MG 24 hr tablet Commonly known as: GLUCOPHAGE-XR Take 500 mg by mouth daily.   multivitamin with minerals Tabs tablet Take 1 tablet by mouth daily.   rOPINIRole 0.5 MG tablet Commonly known as: REQUIP Take 1 tablet (0.5 mg total) by mouth at bedtime.   tamsulosin 0.4 MG Caps capsule Commonly known as: FLOMAX Take 1 capsule (0.4 mg total) by mouth daily. What changed: how much to take   tiotropium 18 MCG inhalation capsule Commonly known as: SPIRIVA Place 1 capsule (18 mcg total) into inhaler and inhale daily.   torsemide 20 MG tablet Commonly known as: Demadex Take 2 tablets (40 mg total) by mouth daily. What changed: when to take this   valsartan 40 MG tablet Commonly known as: Diovan Take 1 tablet (40 mg total) by mouth daily.       Follow-up Information    [facility] Follow up on 09/10/2019.   Specialty: Cardiology Why: at 10:30am. Enter through the [place] entrance Contact information: [place] [phone]             No Known  Allergies  Consultations: Cardio   Procedures/Studies: US Carotid Bilateral  Result Date: 08/14/2019 CLINICAL DATA:  Syncopal episode. History of hypertension and diabetes. Former smoker. EXAM: BILATERAL CAROTID DUPLEX ULTRASOUND TECHNIQUE: Pearline Cables scale imaging, color Doppler and duplex ultrasound were performed of bilateral carotid and vertebral arteries in the neck. COMPARISON:  None. FINDINGS: Criteria: Quantification of carotid stenosis is based on velocity parameters that correlate the residual internal carotid diameter with NASCET-based stenosis levels, using the diameter of the distal internal carotid lumen as the denominator for stenosis measurement. The following velocity measurements were obtained: RIGHT ICA: 63/22 cm/sec CCA: 74/25 cm/sec SYSTOLIC ICA/CCA RATIO:  1.1 ECA: 72 cm/sec LEFT ICA: 61/19 cm/sec CCA: 95/6 cm/sec SYSTOLIC ICA/CCA RATIO:  1.1 ECA: 189 cm/sec RIGHT CAROTID ARTERY: There is no grayscale evidence of significant intimal thickening or atherosclerotic plaque affecting the interrogated portions of the right carotid system. There are no elevated peak systolic velocities within the interrogated course of the right internal carotid artery to  suggest a hemodynamically significant stenosis. RIGHT VERTEBRAL ARTERY:  Antegrade flow LEFT CAROTID ARTERY: There is no grayscale evidence of significant intimal thickening or atherosclerotic plaque affecting the interrogated portions of the left carotid system. There are no elevated peak systolic velocities within the interrogated course of the left internal carotid artery to suggest a hemodynamically significant stenosis. LEFT VERTEBRAL ARTERY:  Antegrade flow IMPRESSION: Unremarkable carotid Doppler ultrasound. Electronically Signed   By: Sandi Mariscal M.D.   On: 08/14/2019 07:32   DG Chest Port 1 View  Result Date: 08/13/2019 CLINICAL DATA:  Dizziness with fall EXAM: PORTABLE CHEST 1 VIEW COMPARISON:  Radiograph 06/27/2019 FINDINGS:  Diffusely increased interstitial opacities with cephalized, indistinct pulmonary vascularity as well as some mild fissural thickening and few peripheral septal lines. No focal consolidative opacity. No pneumothorax or visible effusion. Cardiomediastinal contours are unremarkable for the portable technique. No acute osseous or soft tissue abnormality. Specifically, no visible displaced rib fractures or other acute traumatic osseous injuries. Degenerative changes are present in the imaged spine and shoulders. IMPRESSION: Features most compatible with mild CHF/volume overload with interstitial edema. No acute traumatic findings in the chest. Electronically Signed   By: Lovena Le M.D.   On: 08/13/2019 20:29       Subjective: Pt denies any complaints    Discharge Exam: Vitals:   08/14/19 0700 08/14/19 0815  BP: (!) 151/89 (!) 143/93  Pulse: 64 69  Resp:  18  Temp:  97.7 F (36.5 C)  SpO2: 98% 98%   Vitals:   08/14/19 0530 08/14/19 0600 08/14/19 0700 08/14/19 0815  BP: 139/87 (!) 145/86 (!) 151/89 (!) 143/93  Pulse: 64 66 64 69  Resp:    18  Temp:    97.7 F (36.5 C)  TempSrc:    Oral  SpO2: 96% 96% 98% 98%  Weight:      Height:        General: Pt is alert, awake, not in acute distress Cardiovascular:  S1/S2 +, no rubs, no gallops Respiratory: CTA bilaterally, no wheezing, no rhonchi Abdominal: Soft, NT, ND, bowel sounds + Extremities: no edema, no cyanosis    The results of significant diagnostics from this hospitalization (including imaging, microbiology, ancillary and laboratory) are listed below for reference.     Microbiology: Recent Results (from the past 240 hour(s))  SARS Coronavirus 2 by RT PCR (hospital order, performed in [facility] hospital lab) Nasopharyngeal Nasopharyngeal Swab     Status: None   Collection Time: 08/13/19  8:54 PM   Specimen: Nasopharyngeal Swab  Result Value Ref Range Status   SARS Coronavirus 2 NEGATIVE NEGATIVE Final    Comment:  (NOTE) SARS-CoV-2 target nucleic acids are NOT DETECTED.  The SARS-CoV-2 RNA is generally detectable in upper and lower respiratory specimens during the acute phase of infection. The lowest concentration of SARS-CoV-2 viral copies this assay can detect is 250 copies / mL. A negative result does not preclude SARS-CoV-2 infection and should not be used as the sole basis for treatment or other patient management decisions.  A negative result may occur with improper specimen collection / handling, submission of specimen other than nasopharyngeal swab, presence of viral mutation(s) within the areas targeted by this assay, and inadequate number of viral copies (<250 copies / mL). A negative result must be combined with clinical observations, patient history, and epidemiological information.  Fact Sheet for Patients:   StrictlyIdeas.no  Fact Sheet for Healthcare Providers: BankingDealers.co.za  This test is not yet approved or  cleared  by the [place] and has been authorized for detection and/or diagnosis of SARS-CoV-2 by FDA under an Emergency Use Authorization (EUA).  This EUA will remain in effect (meaning this test can be used) for the duration of the COVID-19 declaration under Section 564(b)(1) of the Act, 21 U.S.C. section 360bbb-3(b)(1), unless the authorization is terminated or revoked sooner.  Performed at [facility], [address] [place], [place] [address]      Labs: BNP (last 3 results) Recent Labs    06/25/19 1353 06/26/19 1056 06/27/19 1059  BNP 2,820.0* 3,147.0* 6,045.4*   Basic Metabolic Panel: Recent Labs  Lab 08/13/19 1417 08/14/19 0501  NA 135 138  K 4.6 4.1  CL 100 104  CO2 25 25  GLUCOSE 272* 160*  BUN 42* 41*  CREATININE 2.36* 1.97*  CALCIUM 8.6* 8.8*  MG 1.6* 1.8  PHOS  --  4.0   Liver Function Tests: Recent Labs  Lab 08/13/19 1417 08/14/19 0501  AST 17 14*  ALT 12 11   ALKPHOS 67 64  BILITOT 0.7 0.6  PROT 7.0 6.4*  ALBUMIN 3.2* 3.0*   No results for input(s): LIPASE, AMYLASE in the last 168 hours. No results for input(s): AMMONIA in the last 168 hours. CBC: Recent Labs  Lab 08/13/19 1417 08/14/19 0501  WBC 8.0 7.1  NEUTROABS  --  3.4  HGB 10.6* 10.5*  HCT 31.4* 29.3*  MCV 85.3 81.6  PLT 190 151   Cardiac Enzymes: Recent Labs  Lab 08/13/19 1417  CKTOTAL 177   BNP: Invalid input(s): POCBNP CBG: Recent Labs  Lab 08/13/19 1243 08/13/19 2053 08/14/19 0820  GLUCAP 238* 100* 126*   D-Dimer No results for input(s): DDIMER in the last 72 hours. Hgb A1c No results for input(s): HGBA1C in the last 72 hours. Lipid Profile No results for input(s): CHOL, HDL, LDLCALC, TRIG, CHOLHDL, LDLDIRECT in the last 72 hours. Thyroid function studies Recent Labs    08/14/19 0501  TSH 2.136   Anemia work up Recent Labs    08/14/19 0501  FOLATE 7.6  FERRITIN 162  TIBC 203*  IRON 83  RETICCTPCT 1.3   Urinalysis    Component Value Date/Time   COLORURINE STRAW (A) 08/13/2019 2200   APPEARANCEUR CLEAR (A) 08/13/2019 2200   LABSPEC 1.009 08/13/2019 2200   PHURINE 6.0 08/13/2019 2200   GLUCOSEU 50 (A) 08/13/2019 2200   HGBUR SMALL (A) 08/13/2019 2200   BILIRUBINUR NEGATIVE 08/13/2019 2200   KETONESUR NEGATIVE 08/13/2019 2200   PROTEINUR 100 (A) 08/13/2019 2200   NITRITE NEGATIVE 08/13/2019 2200   LEUKOCYTESUR NEGATIVE 08/13/2019 2200   Sepsis Labs Invalid input(s): PROCALCITONIN,  WBC,  LACTICIDVEN Microbiology Recent Results (from the past 240 hour(s))  SARS Coronavirus 2 by RT PCR (hospital order, performed in [place] hospital lab) Nasopharyngeal Nasopharyngeal Swab     Status: None   Collection Time: 08/13/19  8:54 PM   Specimen: Nasopharyngeal Swab  Result Value Ref Range Status   SARS Coronavirus 2 NEGATIVE NEGATIVE Final    Comment: (NOTE) SARS-CoV-2 target nucleic acids are NOT DETECTED.  The SARS-CoV-2 RNA is  generally detectable in upper and lower respiratory specimens during the acute phase of infection. The lowest concentration of SARS-CoV-2 viral copies this assay can detect is 250 copies / mL. A negative result does not preclude SARS-CoV-2 infection and should not be used as the sole basis for treatment or other patient management decisions.  A negative result may occur with improper specimen collection /  handling, submission of specimen other than nasopharyngeal swab, presence of viral mutation(s) within the areas targeted by this assay, and inadequate number of viral copies (<250 copies / mL). A negative result must be combined with clinical observations, patient history, and epidemiological information.  Fact Sheet for Patients:   StrictlyIdeas.no  Fact Sheet for Healthcare Providers: BankingDealers.co.za  This test is not yet approved or  cleared by the [place] FDA and has been authorized for detection and/or diagnosis of SARS-CoV-2 by FDA under an Emergency Use Authorization (EUA).  This EUA will remain in effect (meaning this test can be used) for the duration of the COVID-19 declaration under Section 564(b)(1) of the Act, 21 U.S.C. section 360bbb-3(b)(1), unless the authorization is terminated or revoked sooner.  Performed at [facility], [address], [place], [place] [address]      Time coordinating discharge: Over 30 minutes  SIGNED:   Wyvonnia Dusky, MD  [facility] 08/14/2019, 10:39 AM Pager   If 7PM-7AM, please contact night-coverage [email or web address]

## 2019-08-14 NOTE — ED Notes (Signed)
Pt able to ambulate independently denies dizziness at this time. NAD.  MD Jimmye Norman notified.

## 2019-08-14 NOTE — ED Notes (Signed)
Pt provided with breakfast and water.

## 2019-08-14 NOTE — ED Notes (Signed)
NAD noted at this time. Pt ambulated independently with a steady gait. Denies CP/SHOB/dizziness at this time.

## 2019-08-14 NOTE — Consult Note (Signed)
CARDIOLOGY CONSULT NOTE               Patient ID: Shawn Hayes MRN: [id number] DOB/AGE: [birth year] 64 y.o.  Admit date: 08/13/2019 Referring Physician Dr. Marjean Donna  Primary Physician Dr. Ouida Sills  Primary Cardiologist Dr. Ubaldo Glassing  Reason for Consultation Orthostatic hypotension, history of HFrEF   HPI: Shawn Hayes is a 64 year old male with a past medical history significant for HFrEF, idiopathic cardiomyopathy, mitral insufficiency, type 2 diabetes, chronic kidney disease, hypertension, and tobacco abuse who presented to the ED on 08/13/19 for dizziness and near syncope. Workup has been significant for marked orthostatic hypotension, high sensitivity troponin 18 and 18 respectively, creatinine of 2.36, hemoglobin of 10.6, and chest xray revealing mild CHF with interstitial edema.   He reports a month long history of dizziness, worse with exertion.  Symptoms occurred shortly after beginning Entresto.  He also admits to poor oral intake while at work, as he forgets to eat.  He denies chest pain, palpitations, shortness of breath, lower extremity swelling, orthopnea, or PND.  He has been compliant with carvedilol 12.5mg  BID, Entresto 49-51mg  BID, and torsemide 20mg  daily.   Shawn Hayes is followed in outpatient cardiology by Dr. Bartholome Bill. Most recent echocardiogram through [facility] on 04/19/2019 revealed moderately to severely reduced LV systolic function with an EF estimated at 30 to 35% with global hypokinesis and grade 1 diastolic dysfunction. RV systolic function was normal with moderately elevated pulmonary systolic pressures. No significant valvular abnormalities noted. Stress test on 01/09/2019 revealed no evidence of stress-induced ischemia.   Review of systems complete and found to be negative unless listed above     Past Medical History:  Diagnosis Date  . Anemia   . BPH (benign prostatic hyperplasia)   . CHF (congestive heart failure) ([place])   . Chronic kidney  disease   . Controlled type 2 diabetes mellitus without complication ([place])   . COPD (chronic obstructive pulmonary disease) ([place])   . Hypertension   . Idiopathic cardiomyopathy ([place])     Past Surgical History:  Procedure Laterality Date  . APPENDECTOMY    . TONSILLECTOMY      (Not in a hospital admission)  Social History   Socioeconomic History  . Marital status: Single    Spouse name: Not on file  . Number of children: Not on file  . Years of education: Not on file  . Highest education level: Not on file  Occupational History  . Not on file  Tobacco Use  . Smoking status: Former Smoker    Types: Cigarettes  . Smokeless tobacco: Never Used  . Tobacco comment: Pt states he quit 1 week ago  Substance and Sexual Activity  . Alcohol use: Not Currently  . Drug use: Not Currently  . Sexual activity: Not Currently  Other Topics Concern  . 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 Quit smoking- sep 28th, 2020/80ppd. No alcohol.    Social Determinants of Health   Financial Resource Strain:   . Difficulty of Paying Living Expenses:   Food Insecurity:   . Worried About Charity fundraiser in the Last Year:   . Arboriculturist in the Last Year:   Transportation Needs:   . Film/video editor (Medical):   Marland Kitchen Lack of Transportation (Non-Medical):   Physical Activity:   . Days of Exercise per Week:   .  Minutes of Exercise per Session:   Stress:   . Feeling of Stress :   Social Connections:   . Frequency of Communication with Friends and Family:   . Frequency of Social Gatherings with Friends and Family:   . Attends Religious Services:   . Active Member of Clubs or Organizations:   . Attends Archivist Meetings:   Marland Kitchen Marital Status:   Intimate Partner Violence:   . Fear of Current or Ex-Partner:   . Emotionally Abused:   Marland Kitchen Physically Abused:   . Sexually Abused:       Family History  Problem Relation Age of Onset  . Cancer Brother       Review of systems complete and found to be negative unless listed above      PHYSICAL EXAM  General: Well developed, well nourished, in no acute distress HEENT:  Normocephalic and atramatic Neck:  No JVD.  Lungs: Clear bilaterally to auscultation and percussion. Heart: HRRR . Normal S1 and S2 without gallops or murmurs.  Abdomen: Bowel sounds are positive, abdomen soft and non-tender  Msk:  Back normal, normal gait. Normal strength and tone for age. Extremities: No clubbing or cyanosis,  Mild, non-pitting edema noted in bilateral lower extremities   Neuro: Alert and oriented X 3. Psych:  Good affect, responds appropriately  Labs:   Lab Results  Component Value Date   WBC 7.1 08/14/2019   HGB 10.5 (L) 08/14/2019   HCT 29.3 (L) 08/14/2019   MCV 81.6 08/14/2019   PLT 151 08/14/2019    Recent Labs  Lab 08/14/19 0501  NA 138  K 4.1  CL 104  CO2 25  BUN 41*  CREATININE 1.97*  CALCIUM 8.8*  PROT 6.4*  BILITOT 0.6  ALKPHOS 64  ALT 11  AST 14*  GLUCOSE 160*   Lab Results  Component Value Date   CKTOTAL 177 08/13/2019   No results found for: CHOL No results found for: HDL No results found for: LDLCALC No results found for: TRIG No results found for: CHOLHDL No results found for: LDLDIRECT    Radiology: DG Chest Port 1 View  Result Date: 08/13/2019 CLINICAL DATA:  Dizziness with fall EXAM: PORTABLE CHEST 1 VIEW COMPARISON:  Radiograph 06/27/2019 FINDINGS: Diffusely increased interstitial opacities with cephalized, indistinct pulmonary vascularity as well as some mild fissural thickening and few peripheral septal lines. No focal consolidative opacity. No pneumothorax or visible effusion. Cardiomediastinal contours are unremarkable for the portable technique. No acute osseous or soft tissue abnormality. Specifically, no visible displaced rib fractures or other acute traumatic osseous injuries.  Degenerative changes are present in the imaged spine and shoulders. IMPRESSION: Features most compatible with mild CHF/volume overload with interstitial edema. No acute traumatic findings in the chest. Electronically Signed   By: Lovena Le M.D.   On: 08/13/2019 20:29    EKG: Normal sinus rhythm, ventricular rate of 72 bpm, with no evidence of acute ischemia   ASSESSMENT AND PLAN:  1.  Orthostatic hypotension   -Reported episodes of dizziness/lightheadedness for the past month, shortly after beginning Entresto; patient also reports poor oral intake while at work   -Will d/c [employer] and remain and start valsartan 40mg  daily   -No further cardiac workup indicated; would recommend discharge with close outpatient follow up; 7-10 days with Dr. Bartholome Bill or Doristine Mango PA-C   -Will continue carvedilol 12.5mg  BID and torsemide 20mg  daily; following symptoms, will consider alternative therapy such as [place] in an outpatient setting  2.  HFrEF   -Mild pulmonary edema on chest xray; no clinical evidence of shortness of breath, respiratory distress, significant peripheral edema   -Will d/c Entresto as mentioned above, and remain on carvedilol and torsemide   -Continue daily weights, low sodium diet   3.  Acute on chronic kidney disease   -Creatinine 2.36 on admission; improved to 1.97 with IV fluids - baseline appears to be around 1.5   The history, physical exam findings, and plan of care were all discussed with Dr. Bartholome Bill, and all decision making was made in collaboration.   Signed: Avie Arenas PA-C 08/14/2019, 7:05 AM

## 2019-08-15 LAB — HEMOGLOBIN A1C
Hgb A1c MFr Bld: 7.6 % — ABNORMAL HIGH (ref 4.8–5.6)
Mean Plasma Glucose: 171 mg/dL

## 2019-09-09 NOTE — [place] Notes (Signed)
Patient ID: Shawn Hayes, male    DOB: [birth year], 64 y.o.   MRN: [id number]  HPI  Shawn Hayes is a 64 y/o male with a history of DM, anemia, HTN, CKD, COPD, current tobacco use and chronic heart failure.   Echo report from 04/18/19 reviewed and showed an EF of 30-35% along with trivial Shawn and moderately elevated PA pressure.  Was in the ED 08/13/19 due to orthostatic hypotension. Given IVF and was released. Admitted 06/27/19 due to acute on chronic HF after failing outpatient diuresis. Initially placed on lasix drip and then transitioned to oral diuretics. Mildly elevated troponin thought to be due to demand ischemia. Discharged after 7 days. Admitted 04/17/19 due to acute on chronic heart failure with anasarca. Initially given IV lasix and then transitioned to oral diuretics. Spironolactone was held due to hyperkalemia. Given prednisone for COPD exacerbation. Discharged after 6 days.   He presents today for a follow-up visit with a chief complaint of minimal shortness of breath upon moderate exertion. He describes this as chronic in nature having been present for several years although he feels like it has improved. He has associated fatigue, difficulty sleeping, pedal edema and weight gain along with this. He denies any dizziness, abdominal distention, palpitations, chest pain or cough.   Has been out of his diuretic for the last week and has noticed that his swelling has returned. Is getting his scales back out tomorrow. Does wear the compression boots which helps with his swelling. Sits mostly at a computer all day with his legs down.   Past Medical History:  Diagnosis Date  . Anemia   . BPH (benign prostatic hyperplasia)   . CHF (congestive heart failure) ([place])   . Chronic kidney disease   . Controlled type 2 diabetes mellitus without complication ([place])   . COPD (chronic obstructive pulmonary disease) ([place])   . Hypertension   . Idiopathic cardiomyopathy ([place])    Past Surgical History:   Procedure Laterality Date  . APPENDECTOMY    . TONSILLECTOMY     Family History  Problem Relation Age of Onset  . Cancer Brother    Social History   Tobacco Use  . Smoking status: Former Smoker    Types: Cigarettes  . Smokeless tobacco: Never Used  . Tobacco comment: Pt states he quit 1 week ago  Substance Use Topics  . Alcohol use: Not Currently   No Known Allergies  Prior to Admission medications   Medication Sig Start Date End Date Taking? Authorizing Provider  albuterol (VENTOLIN HFA) 108 (90 Base) MCG/ACT inhaler Inhale 2 puffs into the lungs every 6 (six) hours as needed for wheezing or shortness of breath. 04/23/19  Yes [name], [name], MD  budesonide-formoterol [facility]) 160-4.5 MCG/ACT inhaler Inhale 2 puffs into the lungs in the morning and at bedtime. 04/23/19  Yes [name], MD  carvedilol (COREG) 12.5 MG tablet Take 1 tablet (12.5 mg total) by mouth 2 (two) times daily with a meal. 07/04/19  Yes Lorella Nimrod, MD  glipiZIDE (GLUCOTROL) 5 MG tablet Take 1 tablet (5 mg total) by mouth 2 (two) times daily. 04/19/19 04/18/20 Yes [name], [name], MD  metFORMIN (GLUCOPHAGE-XR) 500 MG 24 hr tablet Take 500 mg by mouth daily.    Yes [provider]  Multiple Vitamin (MULTIVITAMIN WITH MINERALS) TABS tablet Take 1 tablet by mouth daily.   Yes [provider]  Omega-3 Fatty Acids (FISH OIL) 1000 MG CAPS Take 1 g by mouth daily.   Yes  [provider]  rOPINIRole (REQUIP) 0.5 MG tablet Take 1 tablet (0.5 mg total) by mouth at bedtime. 04/19/19  Yes [name], [name], MD  tamsulosin (FLOMAX) 0.4 MG CAPS capsule Take 1 capsule (0.4 mg total) by mouth daily. Patient taking differently: Take 0.8 mg by mouth daily.  04/23/19  Yes [name], MD  tiotropium (SPIRIVA) 18 MCG inhalation capsule Place 1 capsule (18 mcg total) into inhaler and inhale daily. 04/23/19  Yes [name], [name], MD  torsemide (DEMADEX) 20 MG tablet Take 2 tablets (40 mg total) by  mouth daily. Patient taking differently: Take 40 mg by mouth.  07/04/19  Yes Lorella Nimrod, MD  valsartan (DIOVAN) 40 MG tablet Take 1 tablet (40 mg total) by mouth daily. 08/14/19  Yes Avie Arenas, PA-C    Review of Systems  Constitutional: Positive for fatigue. Negative for appetite change.  HENT: Negative for congestion, postnasal drip and sore throat.   Eyes: Negative.   Respiratory: Positive for shortness of breath (very minimal). Negative for cough.   Cardiovascular: Positive for leg swelling. Negative for chest pain and palpitations.  Gastrointestinal: Negative for abdominal distention, abdominal pain and diarrhea.  Endocrine: Negative.   Genitourinary: Negative.   Musculoskeletal: Negative for arthralgias, back pain and neck pain.  Skin: Negative.   Allergic/Immunologic: Negative.   Neurological: Positive for numbness (neuropathy in legs). Negative for dizziness and light-headedness.  Hematological: Negative for adenopathy. Does not bruise/bleed easily.  Psychiatric/Behavioral: Positive for sleep disturbance (interrupted sleep; sleeping on 1 pillow). Negative for dysphoric mood. The patient is not nervous/anxious.    Vitals:   09/10/19 1021 09/10/19 1031  BP: (!) 175/105 (!) 160/80  Pulse: 81   Resp: 18   SpO2: 100%   Weight: 212 lb 8 oz (96.4 kg)   Height: 6\' 1"  (1.854 m)    Wt Readings from Last 3 Encounters:  09/10/19 212 lb 8 oz (96.4 kg)  08/13/19 198 lb 6.6 oz (90 kg)  08/13/19 198 lb 8 oz (90 kg)   Lab Results  Component Value Date   CREATININE 1.97 (H) 08/14/2019   CREATININE 2.36 (H) 08/13/2019   CREATININE 2.10 (H) 07/04/2019     Physical Exam Vitals and nursing note reviewed.  Constitutional:      Appearance: He is well-developed.  HENT:     Head: Normocephalic and atraumatic.  Neck:     Vascular: No JVD.  Cardiovascular:     Rate and Rhythm: Normal rate and regular rhythm.  Pulmonary:     Effort: Pulmonary effort is normal. No respiratory  distress.     Breath sounds: No rhonchi or rales.  Abdominal:     General: There is no distension.     Palpations: Abdomen is soft.     Tenderness: There is no abdominal tenderness.  Musculoskeletal:        General: No tenderness.     Cervical back: Normal range of motion and neck supple.     Right lower leg: No tenderness. Edema (1+ pitting) present.     Left lower leg: No tenderness. Edema (1+ pitting) present.  Skin:    General: Skin is warm and dry.  Neurological:     General: No focal deficit present.     Mental Status: He is alert and oriented to person, place, and time.  Psychiatric:        Mood and Affect: Mood normal.        Behavior: Behavior normal.     Assessment & Plan:  1: Chronic heart failure with reduced ejection fraction- - NYHA class II - euvolemic today - not weighing daily but is getting his scales out tomorrow; reminded to call for an overnight weight gain of >2 pounds or a weekly weight gain of >5 pounds - weight up 14 pounds from last visit here 1 month; has been out of his diuretic for the last week - torsemide RX sent in today and instructed him to call us prior to running out - wearing compression boots which do help but says that he usually sits at a computer all day at work with his legs down - not adding salt to his food and is trying to follow a low sodium diet; sister is now cooking for him and doesn't cook with salt - now on valsartan as he developed hypotension on entresto - saw cardiology Minette Brine) 08/12/19 - BNP 06/27/19 was 3153.0  2: HTN- - BP elevated after walking here (175/105) but had improved some upon recheck with a manual cuff (160/80); again has been out of diuretic for the last week - saw PCP Ouida Sills) 07/11/19 & returns in 2 days - BMP 08/14/19 reviewed and showed sodium 138 , potassium 4.1, creatinine 1.97 and GFR 35  3: DM- - A1c 05/30/19 was 8.4% - fasting glucose at home today was 100   Patient did not bring his medications  nor a list. Each medication was verbally reviewed with the patient and he was encouraged to bring the bottles to every visit to confirm accuracy of list.  Return in 3 months or sooner for any questions/problems before then.

## 2019-09-10 ENCOUNTER — Ambulatory Visit: Payer: Medicare HMO | Attending: Family | Admitting: Family

## 2019-09-10 ENCOUNTER — Encounter: Payer: Self-pay | Admitting: Family

## 2019-09-10 ENCOUNTER — Other Ambulatory Visit: Payer: Self-pay

## 2019-09-10 VITALS — BP 160/80 | HR 81 | Resp 18 | Ht 73.0 in | Wt 212.5 lb

## 2019-09-10 DIAGNOSIS — Z79899 Other long term (current) drug therapy: Secondary | ICD-10-CM | POA: Insufficient documentation

## 2019-09-10 DIAGNOSIS — J441 Chronic obstructive pulmonary disease with (acute) exacerbation: Secondary | ICD-10-CM | POA: Insufficient documentation

## 2019-09-10 DIAGNOSIS — I5022 Chronic systolic (congestive) heart failure: Secondary | ICD-10-CM | POA: Insufficient documentation

## 2019-09-10 DIAGNOSIS — N400 Enlarged prostate without lower urinary tract symptoms: Secondary | ICD-10-CM | POA: Diagnosis not present

## 2019-09-10 DIAGNOSIS — Z87891 Personal history of nicotine dependence: Secondary | ICD-10-CM | POA: Insufficient documentation

## 2019-09-10 DIAGNOSIS — Z7951 Long term (current) use of inhaled steroids: Secondary | ICD-10-CM | POA: Diagnosis not present

## 2019-09-10 DIAGNOSIS — Z7952 Long term (current) use of systemic steroids: Secondary | ICD-10-CM | POA: Diagnosis not present

## 2019-09-10 DIAGNOSIS — Z7984 Long term (current) use of oral hypoglycemic drugs: Secondary | ICD-10-CM | POA: Insufficient documentation

## 2019-09-10 DIAGNOSIS — E1122 Type 2 diabetes mellitus with diabetic chronic kidney disease: Secondary | ICD-10-CM | POA: Insufficient documentation

## 2019-09-10 DIAGNOSIS — N189 Chronic kidney disease, unspecified: Secondary | ICD-10-CM | POA: Diagnosis not present

## 2019-09-10 DIAGNOSIS — I10 Essential (primary) hypertension: Secondary | ICD-10-CM

## 2019-09-10 DIAGNOSIS — I429 Cardiomyopathy, unspecified: Secondary | ICD-10-CM | POA: Diagnosis not present

## 2019-09-10 DIAGNOSIS — I130 Hypertensive heart and chronic kidney disease with heart failure and stage 1 through stage 4 chronic kidney disease, or unspecified chronic kidney disease: Secondary | ICD-10-CM | POA: Insufficient documentation

## 2019-09-10 MED ORDER — TORSEMIDE 20 MG PO TABS: 40.0000 mg | ORAL_TABLET | Freq: Every day | ORAL | 6 refills | Status: DC

## 2019-09-10 NOTE — Patient Instructions (Signed)
Continue weighing daily and call for an overnight weight gain of > 2 pounds or a weekly weight gain of >5 pounds. 

## 2019-09-12 DIAGNOSIS — I700 Atherosclerosis of aorta: Secondary | ICD-10-CM | POA: Diagnosis not present

## 2019-09-12 DIAGNOSIS — N183 Chronic kidney disease, stage 3 unspecified: Secondary | ICD-10-CM | POA: Diagnosis not present

## 2019-09-12 DIAGNOSIS — I5020 Unspecified systolic (congestive) heart failure: Secondary | ICD-10-CM | POA: Diagnosis not present

## 2019-09-12 DIAGNOSIS — E1122 Type 2 diabetes mellitus with diabetic chronic kidney disease: Secondary | ICD-10-CM | POA: Diagnosis not present

## 2019-09-12 DIAGNOSIS — J42 Unspecified chronic bronchitis: Secondary | ICD-10-CM | POA: Diagnosis not present

## 2019-09-24 ENCOUNTER — Ambulatory Visit: Payer: Medicare HMO | Admitting: Urology

## 2019-09-25 NOTE — [place] Notes (Signed)
09/26/2019 3:56 PM   Shawn Hayes Jul 23, 1955 [id number]  Referring provider: [name], MD [facility] [place],  [place] [address] Chief Complaint  Patient presents with  . Hematuria    New Patient    HPI: Shawn Hayes is a 64 y.o. male with hematuria who is seen for evaluation and management of gross hematuria / microscopic hematuria.   CT A/P w/o contrast on 11/29/2018 showed no CT findings to account for the patient's upper abdominal pain. Small bilateral inguinal nodes measure up to 14 mm short axis, at the upper limits of normal, favored to be reactive. Otherwise, no suspicious abdominopelvic lymphadenopathy. Spleen was normal in size. Cardiomegaly with small pericardial effusion and small bilateral pleural effusions.  The patient was seen by their PCP on 09/12/2019 for a routine follow up. He was on a diabetic diet and medications were tolerated. His breathing was at baseline. He had no abdominal pain but noted back pain. Denied fever, nausea, and chills.   UA revealed protein >=300, glucose 250, gross hematuria, 10-50 RBC, rare bacteria. There was no associating urine culture.   One year ago the patient noted hematuria x 2. Denies hematuria today.   He has been on Flomax from 3 month secondary to a weak stream and nocturia x 3 nightly. Symptoms have improved slightly. His stream is still weak.   Current smoker. Smoked heavily in the past. Has tried stopping numerous times.    PMH: Past Medical History:  Diagnosis Date  . Anemia   . BPH (benign prostatic hyperplasia)   . CHF (congestive heart failure) ([place])   . Chronic kidney disease   . Controlled type 2 diabetes mellitus without complication ([place])   . COPD (chronic obstructive pulmonary disease) ([place])   . Hypertension   . Idiopathic cardiomyopathy [facility])     Surgical History: Past Surgical History:  Procedure Laterality Date  . APPENDECTOMY    . TONSILLECTOMY       Home Medications:  Allergies as of 09/26/2019   No Known Allergies     Medication List       Accurate as of September 26, 2019 11:59 PM. If you have any questions, ask your nurse or doctor.        albuterol 108 (90 Base) MCG/ACT inhaler Commonly known as: VENTOLIN HFA Inhale 2 puffs into the lungs every 6 (six) hours as needed for wheezing or shortness of breath.   budesonide-formoterol 160-4.5 MCG/ACT inhaler Commonly known as: Symbicort Inhale 2 puffs into the lungs in the morning and at bedtime.   carvedilol 12.5 MG tablet Commonly known as: COREG Take 1 tablet (12.5 mg total) by mouth 2 (two) times daily with a meal.   Entresto 24-26 MG Generic drug: sacubitril-valsartan   Fish Oil 1000 MG Caps Take 1 g by mouth daily.   glipiZIDE 5 MG tablet Commonly known as: GLUCOTROL Take 1 tablet (5 mg total) by mouth 2 (two) times daily.   metFORMIN 500 MG 24 hr tablet Commonly known as: GLUCOPHAGE-XR Take 500 mg by mouth daily.   multivitamin with minerals Tabs tablet Take 1 tablet by mouth daily.   rOPINIRole 0.5 MG tablet Commonly known as: REQUIP Take 1 tablet (0.5 mg total) by mouth at bedtime.   tamsulosin 0.4 MG Caps capsule Commonly known as: FLOMAX Take 1 capsule (0.4 mg total) by mouth daily. What changed: how much to take   tiotropium 18 MCG inhalation capsule Commonly known as:  SPIRIVA Place 1 capsule (18 mcg total) into inhaler and inhale daily.   torsemide 20 MG tablet Commonly known as: Demadex Take 2 tablets (40 mg total) by mouth daily.   valsartan 40 MG tablet Commonly known as: Diovan Take 1 tablet (40 mg total) by mouth daily.       Allergies: No Known Allergies  Family History: Family History  Problem Relation Age of Onset  . Cancer Brother   . Kidney failure Brother   . Kidney cancer Neg Hx   . Bladder Cancer Neg Hx   . Prostate cancer Neg Hx     Social History:  reports that he has quit smoking. His smoking use included  cigarettes. He has never used smokeless tobacco. He reports previous alcohol use. He reports previous drug use.   Physical Exam: BP (!) 154/83   Pulse 80   Ht 6\' 1"  (1.854 m)   Wt (!) 206 lb (93.4 kg)   BMI 27.18 kg/m   Constitutional:  Alert and oriented, No acute distress. HEENT: [place] AT, moist mucus membranes.  Trachea midline, no masses. Cardiovascular: No clubbing, cyanosis, or edema. Respiratory: Normal respiratory effort, no increased work of breathing. GI: Abdomen is soft, nontender, nondistended, no abdominal masses GU: No CVA tenderness Rectal: Sphincter tone, 50 g mild prostatomegaly, no nodules/tenderness Skin: No rashes, bruises or suspicious lesions. Neurologic: Grossly intact, no focal deficits, moving all 4 extremities. Psychiatric: Normal mood and affect.  Laboratory Data:  Lab Results  Component Value Date   CREATININE 1.97 (H) 08/14/2019    Lab Results  Component Value Date   HGBA1C 7.6 (H) 08/13/2019    Urinalysis shows 11-30 RBC, no WBC, no nitrates   Pertinent Imaging: CLINICAL DATA:  Anemia, upper quadrant abdominal pain  EXAM: CT ABDOMEN AND PELVIS WITHOUT CONTRAST  TECHNIQUE: Multidetector CT imaging of the abdomen and pelvis was performed following the standard protocol without IV contrast.  COMPARISON:  None.  FINDINGS: Lower chest: Cardiomegaly.  Small pericardial effusion.  Small bilateral pleural effusions.  Hepatobiliary: Unenhanced liver is grossly unremarkable.  Gallbladder is unremarkable. No intrahepatic or extrahepatic ductal dilatation.  Pancreas: Within normal limits.  Spleen: Spleen is normal in size.  Adrenals/Urinary Tract: Adrenal glands are within normal limits.  Kidneys are within normal limits. No renal, ureteral, or bladder calculi. No hydronephrosis.  Bladder is within normal limits.  Stomach/Bowel: Stomach is within normal limits.  No evidence of bowel obstruction.  Appendix is not  discretely visualized.  No colonic wall thickening or mass is evident on CT. Ascending colon is mildly thick-walled although underdistended.  Vascular/Lymphatic: No evidence of abdominal aortic aneurysm.  Atherosclerotic calcifications of the abdominal aorta and branch vessels.  Small bilateral inguinal nodes measuring up to 14 mm short axis on the right (series 2/image 97), at the upper limits of normal and favored to be reactive. Otherwise, no suspicious abdominopelvic lymphadenopathy.  Reproductive: Prostate is grossly unremarkable.  Other: No abdominopelvic ascites.  Musculoskeletal: Mild degenerative changes of the visualized thoracolumbar spine.  IMPRESSION: No CT findings to account for the patient's upper abdominal pain.  Small bilateral inguinal nodes measure up to 14 mm short axis, at the upper limits of normal, favored to be reactive. Otherwise, no suspicious abdominopelvic lymphadenopathy. Spleen is normal in size.  Cardiomegaly with small pericardial effusion and small bilateral pleural effusions.   Electronically Signed   By: Julian Hy M.D.   On: 11/30/2018 08:15   I have personally reviewed the images and agree with radiologist  interpretation.     Assessment & Plan:    1. Microscopic hematuria  Remote history of gross hematuria. Current smoker. Baseline creatinine is 1.6. GFR is 47 mL/min, likely unable to receive IV contrast thus precluding CT urogram Recommended a cystoscopy for further evaluation.  Will consider upper tract imaging and other options for intervention base on results above.  Patient agreed.  UA shows 11-30 RBC, no WBC, no nitrates  -Urine cystology sent.  2. BPH with weak urinary stream Patient has been on Flomax x 3 months with minimal improvement.  Will consider additional treatment based on work up above.   3. PSA screening Patient will benefit from screening despite age and commodities.  No recent PSA  values. PSA today. DRE was benign showing mild prostatomegaly.  [place] [address], [place], [place] [address] [phone]  I, Selena Batten, am acting as a scribe for Dr. Hollice Espy.  I have reviewed the above documentation for accuracy and completeness, and I agree with the above.   Hollice Espy, MD

## 2019-09-26 ENCOUNTER — Ambulatory Visit (INDEPENDENT_AMBULATORY_CARE_PROVIDER_SITE_OTHER): Payer: Medicare HMO | Admitting: Urology

## 2019-09-26 ENCOUNTER — Other Ambulatory Visit: Payer: Self-pay

## 2019-09-26 ENCOUNTER — Encounter: Payer: Self-pay | Admitting: Urology

## 2019-09-26 ENCOUNTER — Other Ambulatory Visit: Payer: Self-pay | Admitting: Urology

## 2019-09-26 VITALS — BP 154/83 | HR 80 | Ht 73.0 in | Wt 206.0 lb

## 2019-09-26 DIAGNOSIS — R3912 Poor urinary stream: Secondary | ICD-10-CM

## 2019-09-26 DIAGNOSIS — N401 Enlarged prostate with lower urinary tract symptoms: Secondary | ICD-10-CM | POA: Diagnosis not present

## 2019-09-26 DIAGNOSIS — Z125 Encounter for screening for malignant neoplasm of prostate: Secondary | ICD-10-CM | POA: Diagnosis not present

## 2019-09-26 DIAGNOSIS — R319 Hematuria, unspecified: Secondary | ICD-10-CM | POA: Diagnosis not present

## 2019-09-27 ENCOUNTER — Telehealth: Payer: Self-pay | Admitting: *Deleted

## 2019-09-27 LAB — MICROSCOPIC EXAMINATION: Bacteria, UA: NONE SEEN

## 2019-09-27 LAB — URINALYSIS, COMPLETE
Bilirubin, UA: NEGATIVE
Ketones, UA: NEGATIVE
Leukocytes,UA: NEGATIVE
Nitrite, UA: NEGATIVE
Specific Gravity, UA: 1.015 (ref 1.005–1.030)
Urobilinogen, Ur: 0.2 mg/dL (ref 0.2–1.0)
pH, UA: 6 (ref 5.0–7.5)

## 2019-09-27 LAB — CYTOLOGY - NON PAP

## 2019-09-27 LAB — PSA: Prostate Specific Ag, Serum: 2.4 ng/mL (ref 0.0–4.0)

## 2019-09-27 NOTE — Telephone Encounter (Addendum)
Patient informed, voiced understanding.  ----- Message from Hollice Espy, MD sent at 09/27/2019 10:17 AM EDT ----- PSA is normal, 2.4.  Good news.  Hollice Espy, MD

## 2019-09-30 LAB — PATHOLOGY

## 2019-10-14 NOTE — [place] Notes (Signed)
   10/15/2019  CC:  Chief Complaint  Patient presents with  . Cysto    HPI: Shawn Hayes is a 64 y.o. male with microscopic hematuria who returns today for a cystoscopy.   CT A/P w/o contrast on 11/29/2018 showed no CT findings to account for the patient's upper abdominal pain. Small bilateral inguinal nodes measure up to 14 mm short axis, at the upper limits of normal, favored to be reactive. Otherwise, no suspicious abdominopelvic lymphadenopathy. Spleen was normal in size. Cardiomegaly with small pericardial effusion and small bilateral pleural effusions.  The patient was seen by their PCP on 09/12/2019 for a routine follow up. He was on a diabetic diet and medications were tolerated. His breathing was at baseline. He had no abdominal pain but noted back pain. Denied fever, nausea, and chills.   UA revealed protein >=300, glucose 250, gross hematuria, 10-50 RBC, rare bacteria. There was no associating urine culture.   One year ago the patient noted hematuria x 2. Denies hematuria today.   At last visit, he had been on Flomax from 3 month secondary to a weak stream and nocturia x 3 nightly. Symptoms had improved slightly. His stream was still weak.   PSA is 2.4 as of 09/26/2019.   Patient states that Flomax mildly improved symptoms.   Current smoker. Smoked heavily in the past. Has tried stopping numerous times.     Blood pressure (!) 155/78, pulse 87. NED. A&Ox3.   No respiratory distress   Abd soft, NT, ND Normal phallus with bilateral descended testicles  Cystoscopy Procedure Note  Patient identification was confirmed, informed consent was obtained, and patient was prepped using Betadine solution.  Lidocaine jelly was administered per urethral meatus.     Pre-Procedure: - Inspection reveals a normal caliber ureteral meatus.  Procedure: The flexible cystoscope was introduced without difficulty - No urethral strictures/lesions are present. - Enlarged 89 g  prostate  - Elevated bladder neck - Bilateral ureteral orifices identified - Bladder mucosa  reveals no ulcers, tumors, or lesions - No bladder stones - Minimal trabeculation  Retroflexion shows no abnormalities.  No median lobe.   Post-Procedure: - Patient tolerated the procedure well  Assessment/ Plan:  1. Microscopic hematuria Remote history of gross hematuria. Current smoker Baseline creatinine is 1.6. GFR is 47 mL/min, likely unable to receive IV contrast  Cystoscopy is unremarkable. Urine cytology negative No recent upper tract imaging. Most recent imaging was negative. Will consider bilateral retrogrades if he is considering an outlet procedure for completeness  2. BPH with weak urinary stream Flomax mildly improved symptoms.  Patient may benefit from outlet procedure, given [employer] handout. We will have him return to discuss this further  3. PSA screening  PSA is 2.4 as of 09/26/2019.     Fransico Him, am acting as a scribe for Dr. Hollice Espy.  I have reviewed the above documentation for accuracy and completeness, and I agree with the above.   Hollice Espy, MD

## 2019-10-15 ENCOUNTER — Ambulatory Visit (INDEPENDENT_AMBULATORY_CARE_PROVIDER_SITE_OTHER): Payer: Medicare HMO | Admitting: Urology

## 2019-10-15 ENCOUNTER — Encounter: Payer: Self-pay | Admitting: Urology

## 2019-10-15 ENCOUNTER — Other Ambulatory Visit: Payer: Self-pay

## 2019-10-15 VITALS — BP 155/78 | HR 87

## 2019-10-15 DIAGNOSIS — R319 Hematuria, unspecified: Secondary | ICD-10-CM

## 2019-10-15 LAB — MICROSCOPIC EXAMINATION: Bacteria, UA: NONE SEEN

## 2019-10-15 LAB — URINALYSIS, COMPLETE
Bilirubin, UA: NEGATIVE
Ketones, UA: NEGATIVE
Leukocytes,UA: NEGATIVE
Nitrite, UA: NEGATIVE
Specific Gravity, UA: 1.025 (ref 1.005–1.030)
Urobilinogen, Ur: 0.2 mg/dL (ref 0.2–1.0)
pH, UA: 6 (ref 5.0–7.5)

## 2019-10-29 ENCOUNTER — Ambulatory Visit: Payer: Self-pay | Admitting: Urology

## 2019-11-05 ENCOUNTER — Ambulatory Visit: Payer: Self-pay | Admitting: Urology

## 2019-11-14 ENCOUNTER — Ambulatory Visit: Payer: Medicare HMO | Admitting: Family

## 2019-11-19 ENCOUNTER — Ambulatory Visit: Payer: Self-pay | Admitting: Urology

## 2019-11-19 ENCOUNTER — Encounter: Payer: Self-pay | Admitting: Urology

## 2019-12-10 ENCOUNTER — Ambulatory Visit: Payer: Medicare HMO | Attending: Family | Admitting: Family

## 2019-12-10 ENCOUNTER — Other Ambulatory Visit: Payer: Self-pay

## 2019-12-10 ENCOUNTER — Encounter: Payer: Self-pay | Admitting: Family

## 2019-12-10 VITALS — BP 179/95 | HR 80 | Resp 18 | Ht 73.0 in | Wt 209.4 lb

## 2019-12-10 DIAGNOSIS — N183 Chronic kidney disease, stage 3 unspecified: Secondary | ICD-10-CM

## 2019-12-10 DIAGNOSIS — I5022 Chronic systolic (congestive) heart failure: Secondary | ICD-10-CM | POA: Diagnosis not present

## 2019-12-10 DIAGNOSIS — Z7984 Long term (current) use of oral hypoglycemic drugs: Secondary | ICD-10-CM | POA: Diagnosis not present

## 2019-12-10 DIAGNOSIS — Z87891 Personal history of nicotine dependence: Secondary | ICD-10-CM | POA: Insufficient documentation

## 2019-12-10 DIAGNOSIS — E1122 Type 2 diabetes mellitus with diabetic chronic kidney disease: Secondary | ICD-10-CM | POA: Insufficient documentation

## 2019-12-10 DIAGNOSIS — I130 Hypertensive heart and chronic kidney disease with heart failure and stage 1 through stage 4 chronic kidney disease, or unspecified chronic kidney disease: Secondary | ICD-10-CM | POA: Diagnosis not present

## 2019-12-10 DIAGNOSIS — N400 Enlarged prostate without lower urinary tract symptoms: Secondary | ICD-10-CM | POA: Diagnosis not present

## 2019-12-10 DIAGNOSIS — N189 Chronic kidney disease, unspecified: Secondary | ICD-10-CM | POA: Diagnosis not present

## 2019-12-10 DIAGNOSIS — I10 Essential (primary) hypertension: Secondary | ICD-10-CM

## 2019-12-10 DIAGNOSIS — Z79899 Other long term (current) drug therapy: Secondary | ICD-10-CM | POA: Diagnosis not present

## 2019-12-10 DIAGNOSIS — J441 Chronic obstructive pulmonary disease with (acute) exacerbation: Secondary | ICD-10-CM | POA: Insufficient documentation

## 2019-12-10 DIAGNOSIS — R519 Headache, unspecified: Secondary | ICD-10-CM | POA: Diagnosis not present

## 2019-12-10 DIAGNOSIS — Z7951 Long term (current) use of inhaled steroids: Secondary | ICD-10-CM | POA: Diagnosis not present

## 2019-12-10 DIAGNOSIS — I429 Cardiomyopathy, unspecified: Secondary | ICD-10-CM | POA: Diagnosis not present

## 2019-12-10 DIAGNOSIS — D631 Anemia in chronic kidney disease: Secondary | ICD-10-CM | POA: Diagnosis not present

## 2019-12-10 LAB — BASIC METABOLIC PANEL
Anion gap: 10 (ref 5–15)
BUN: 31 mg/dL — ABNORMAL HIGH (ref 8–23)
CO2: 23 mmol/L (ref 22–32)
Calcium: 8.6 mg/dL — ABNORMAL LOW (ref 8.9–10.3)
Chloride: 102 mmol/L (ref 98–111)
Creatinine, Ser: 2.46 mg/dL — ABNORMAL HIGH (ref 0.61–1.24)
GFR, Estimated: 26 mL/min — ABNORMAL LOW (ref >60)
Glucose, Bld: 278 mg/dL — ABNORMAL HIGH (ref 70–99)
Potassium: 4.4 mmol/L (ref 3.5–5.1)
Sodium: 135 mmol/L (ref 135–145)

## 2019-12-10 NOTE — Patient Instructions (Addendum)
Keep a track on BP. Please call Dr. Tonette Bihari office to inform them of pounding in head and increased BP's.  SHOULD NOT BE TAKING VALSARTAN, check your bottles at home and make sure you aren't taking this medication  If you develop worsening headaches, double vision, slurred speech or new onset numbness to one side of body please seek immediate ER care.  Try to make more grilled or low sodium choices when eating out and limit fluid intake to 6 cups per day.

## 2019-12-10 NOTE — Progress Notes (Signed)
Shawn Hayes ID: Shawn Hayes, male    DOB: May 30, 1955, 64 y.o.   MRN: [id number]  HPI  Shawn Hayes is a 64 y/o male with a history of DM, anemia, HTN, CKD, COPD, current tobacco use and chronic heart failure.   Echo report from 04/18/19 reviewed and showed an EF of 30-35% along with trivial Shawn and moderately elevated PA pressure.  Was in Shawn ED 08/13/19 due to orthostatic hypotension. Given IVF and was released. Admitted 06/27/19 due to acute on chronic HF after failing outpatient diuresis. Initially placed on lasix drip and then transitioned to oral diuretics. Mildly elevated troponin thought to be due to demand ischemia. Discharged after 7 days. Admitted 04/17/19 due to acute on chronic heart failure with anasarca. Initially given IV lasix and then transitioned to oral diuretics. Spironolactone was held due to hyperkalemia. Given prednisone for COPD exacerbation. Discharged after 6 days.   Shawn Hayes presents today for a follow-up visit with a chief complaint of minimal shortness of breath upon moderate exertion, "pounding" feeling in head, and fatigue. Shawn Hayes reports Shawn Hayes has not been watching his diet as Shawn Hayes should and has been eating out frequently but denies adding salt to food intentionally.   Past Medical History:  Diagnosis Date  . Anemia   . BPH (benign prostatic hyperplasia)   . CHF (congestive heart failure) ([place])   . Chronic kidney disease   . Controlled type 2 diabetes mellitus without complication ([place])   . COPD (chronic obstructive pulmonary disease) ([place])   . Hypertension   . Idiopathic cardiomyopathy ([place])    Past Surgical History:  Procedure Laterality Date  . APPENDECTOMY    . TONSILLECTOMY     Family History  Problem Relation Age of Onset  . Cancer Brother   . Kidney failure Brother   . Kidney cancer Neg Hx   . Bladder Cancer Neg Hx   . Prostate cancer Neg Hx    Social History   Tobacco Use  . Smoking status: Former Smoker    Types: Cigarettes  . Smokeless tobacco:  Never Used  . Tobacco comment: Pt states Shawn Hayes quit 1 week ago  Substance Use Topics  . Alcohol use: Not Currently   No Known Allergies  Prior to Admission medications   Medication Sig Start Date End Date Taking? Authorizing Provider  albuterol (VENTOLIN HFA) 108 (90 Base) MCG/ACT inhaler Inhale 2 puffs into Shawn lungs every 6 (six) hours as needed for wheezing or shortness of breath. 04/23/19  Yes [name], [name], MD  budesonide-formoterol [facility]) 160-4.5 MCG/ACT inhaler Inhale 2 puffs into Shawn lungs in Shawn morning and at bedtime. 04/23/19  Yes [name], MD  carvedilol (COREG) 12.5 MG tablet Take 1 tablet (12.5 mg total) by mouth 2 (two) times daily with a meal. 07/04/19  Yes Lorella Nimrod, MD  glipiZIDE (GLUCOTROL) 5 MG tablet Take 1 tablet (5 mg total) by mouth 2 (two) times daily. 04/19/19 04/18/20 Yes [name], [name], MD  metFORMIN (GLUCOPHAGE-XR) 500 MG 24 hr tablet Take 500 mg by mouth daily.    Yes [provider]  Multiple Vitamin (MULTIVITAMIN WITH MINERALS) TABS tablet Take 1 tablet by mouth daily.   Yes [provider]  Omega-3 Fatty Acids (FISH OIL) 1000 MG CAPS Take 1 g by mouth daily.   Yes [provider]  rOPINIRole (REQUIP) 0.5 MG tablet Take 1 tablet (0.5 mg total) by mouth at bedtime. 04/19/19  Yes [name], [name], MD  tamsulosin (FLOMAX) 0.4 MG CAPS capsule Take 1 capsule (  0.4 mg total) by mouth daily. Shawn Hayes taking differently: Take 0.8 mg by mouth daily.  04/23/19  Yes [name], MD  tiotropium (SPIRIVA) 18 MCG inhalation capsule Place 1 capsule (18 mcg total) into inhaler and inhale daily. 04/23/19  Yes [name], [name], MD  torsemide (DEMADEX) 20 MG tablet Take 2 tablets (40 mg total) by mouth daily. Shawn Hayes taking differently: Take 40 mg by mouth.  07/04/19  Yes Lorella Nimrod, MD  valsartan (DIOVAN) 40 MG tablet Take 1 tablet (40 mg total) by mouth daily. 08/14/19  Yes Avie Arenas, PA-C    Review of Systems  Constitutional:  Positive for fatigue (worsening). Negative for appetite change.  HENT: Negative for congestion, postnasal drip and sore throat.   Eyes: Negative.   Respiratory: Positive for shortness of breath (very little). Negative for cough.   Cardiovascular: Positive for leg swelling (worse at night). Negative for chest pain and palpitations.  Gastrointestinal: Negative for abdominal distention, abdominal pain and diarrhea.  Endocrine: Negative.   Genitourinary: Negative.   Musculoskeletal: Negative for arthralgias, back pain and neck pain.  Skin: Negative.   Allergic/Immunologic: Negative.   Neurological: Positive for numbness (neuropathy in legs). Negative for dizziness and light-headedness.       Pounding in head  Hematological: Negative for adenopathy. Does not bruise/bleed easily.  Psychiatric/Behavioral: Positive for sleep disturbance (interrupted sleep; sleeping on 1 pillow). Negative for dysphoric mood. Shawn Hayes is not nervous/anxious.    Vitals:   12/10/19 1027  BP: (!) 179/95  Pulse: 80  Resp: 18  SpO2: 100%  Weight: 209 lb 6 oz (95 kg)  Height: 6\' 1"  (1.854 m)   Wt Readings from Last 3 Encounters:  12/10/19 209 lb 6 oz (95 kg)  09/26/19 (!) 206 lb (93.4 kg)  09/10/19 212 lb 8 oz (96.4 kg)   Lab Results  Component Value Date   CREATININE 1.97 (H) 08/14/2019   CREATININE 2.36 (H) 08/13/2019   CREATININE 2.10 (H) 07/04/2019     Physical Exam Vitals and nursing note reviewed.  Constitutional:      Appearance: Shawn Hayes is well-developed.  HENT:     Head: Normocephalic and atraumatic.  Neck:     Vascular: No JVD.  Cardiovascular:     Rate and Rhythm: Normal rate and regular rhythm.  Pulmonary:     Effort: Pulmonary effort is normal. No respiratory distress.     Breath sounds: No rhonchi or rales.  Abdominal:     General: There is no distension.     Palpations: Abdomen is soft.     Tenderness: There is no abdominal tenderness.  Musculoskeletal:        General: No  tenderness.     Cervical back: Normal range of motion and neck supple.     Right lower leg: No tenderness. Edema (1+ pitting) present.     Left lower leg: No tenderness. Edema (Trace pitting) present.  Skin:    General: Skin is warm and dry.  Neurological:     General: No focal deficit present.     Mental Status: Shawn Hayes is alert and oriented to person, place, and time.  Psychiatric:        Mood and Affect: Mood normal.        Behavior: Behavior normal.     Assessment & Plan:  1: Chronic heart failure with reduced ejection fraction- - NYHA class II - euvolemic today - not weighing daily but is getting his scales out tomorrow; reminded to call for an overnight  weight gain of >2 pounds or a weekly weight gain of >5 pounds - weight up 3 pounds from last visit here 1 month - wearing compression boots which do help but says that Shawn Hayes usually sits at a computer all day at work with his legs down - not adding salt to his food and is trying to follow a low sodium diet; Shawn Hayes has been getting food from fast food places, often fried chicken Shawn Hayes reports. Encouraged to make more health conscious decisions even when eating out, such as fish options or chicken that is baked or grilled.  -Shawn Hayes is back on Entresto, told to make sure Shawn Hayes is not taking Shawn Valsartan today. Blood pressure is not well controlled at this time.  - saw cardiology Minette Brine) 08/12/19 - BNP 06/27/19 was 3153.0  2: HTN- - BP elevated after walking here (179/95) but had improved some upon recheck with a manual cuff (162/84); Reports 160's/80's consistently at home.  - saw PCP Ouida Sills) 09/12/19 - BMP 08/14/19 reviewed and showed sodium 138 , potassium 4.1, creatinine 1.97 and GFR 35, sent today for BMP draw. -Discussed at length signs and concerns of stroke due to not well controlled BP. Told Shawn Hayes if Shawn Hayes experiences "worst headache" Shawn Hayes has ever had, has blurred vision, or changes in speech or ability to move on portion of body to  immediately go to Shawn ER.   3: DM- - A1c 08/13/19 was 7.6% - fasting glucose at home today was 140   Shawn Hayes did not bring his medications nor a list. Each medication was verbally reviewed with Shawn Hayes and Shawn Hayes was encouraged to bring Shawn bottles to every visit to confirm accuracy of list.  Return in 3 months or sooner for any questions/problems before then.

## 2019-12-11 ENCOUNTER — Telehealth: Payer: Self-pay | Admitting: Family

## 2019-12-11 NOTE — Telephone Encounter (Cosign Needed)
Spoke to patient regarding his elevation in Creatinine. Advised patient to decrease Torsemide to 20mg  daily for 1 week following lab results. Patient confirmed he is not taking Valsartan in combination with Entresto. Patient to follow up back in office 10/19 at 1200. Patient advised if he starts to feel he is retaining fluid to call the office sooner. Instructed to adhere to low sodium diet and to make sure following fluid restrictions. Patient is agreeable to plan of care.

## 2019-12-11 NOTE — Telephone Encounter (Signed)
Agree with conversation and plan with patient.

## 2019-12-17 ENCOUNTER — Other Ambulatory Visit: Payer: Self-pay

## 2019-12-17 ENCOUNTER — Ambulatory Visit: Payer: Medicare HMO | Attending: Family | Admitting: Family

## 2019-12-17 ENCOUNTER — Telehealth: Payer: Self-pay | Admitting: Family

## 2019-12-17 ENCOUNTER — Encounter: Payer: Self-pay | Admitting: Family

## 2019-12-17 VITALS — BP 168/96 | HR 81 | Resp 18 | Ht 73.0 in | Wt 211.0 lb

## 2019-12-17 DIAGNOSIS — I10 Essential (primary) hypertension: Secondary | ICD-10-CM

## 2019-12-17 DIAGNOSIS — Z7951 Long term (current) use of inhaled steroids: Secondary | ICD-10-CM | POA: Diagnosis not present

## 2019-12-17 DIAGNOSIS — N184 Chronic kidney disease, stage 4 (severe): Secondary | ICD-10-CM

## 2019-12-17 DIAGNOSIS — Z7984 Long term (current) use of oral hypoglycemic drugs: Secondary | ICD-10-CM | POA: Insufficient documentation

## 2019-12-17 DIAGNOSIS — J449 Chronic obstructive pulmonary disease, unspecified: Secondary | ICD-10-CM | POA: Insufficient documentation

## 2019-12-17 DIAGNOSIS — N400 Enlarged prostate without lower urinary tract symptoms: Secondary | ICD-10-CM | POA: Diagnosis not present

## 2019-12-17 DIAGNOSIS — E1122 Type 2 diabetes mellitus with diabetic chronic kidney disease: Secondary | ICD-10-CM | POA: Diagnosis not present

## 2019-12-17 DIAGNOSIS — N189 Chronic kidney disease, unspecified: Secondary | ICD-10-CM | POA: Diagnosis not present

## 2019-12-17 DIAGNOSIS — I5022 Chronic systolic (congestive) heart failure: Secondary | ICD-10-CM | POA: Diagnosis not present

## 2019-12-17 DIAGNOSIS — Z87891 Personal history of nicotine dependence: Secondary | ICD-10-CM | POA: Diagnosis not present

## 2019-12-17 DIAGNOSIS — I429 Cardiomyopathy, unspecified: Secondary | ICD-10-CM | POA: Diagnosis not present

## 2019-12-17 DIAGNOSIS — Z79899 Other long term (current) drug therapy: Secondary | ICD-10-CM | POA: Diagnosis not present

## 2019-12-17 DIAGNOSIS — I130 Hypertensive heart and chronic kidney disease with heart failure and stage 1 through stage 4 chronic kidney disease, or unspecified chronic kidney disease: Secondary | ICD-10-CM | POA: Insufficient documentation

## 2019-12-17 LAB — BASIC METABOLIC PANEL
Anion gap: 10 (ref 5–15)
BUN: 30 mg/dL — ABNORMAL HIGH (ref 8–23)
CO2: 20 mmol/L — ABNORMAL LOW (ref 22–32)
Calcium: 8.5 mg/dL — ABNORMAL LOW (ref 8.9–10.3)
Chloride: 104 mmol/L (ref 98–111)
Creatinine, Ser: 2.28 mg/dL — ABNORMAL HIGH (ref 0.61–1.24)
GFR, Estimated: 29 mL/min — ABNORMAL LOW (ref >60)
Glucose, Bld: 269 mg/dL — ABNORMAL HIGH (ref 70–99)
Potassium: 4.5 mmol/L (ref 3.5–5.1)
Sodium: 134 mmol/L — ABNORMAL LOW (ref 135–145)

## 2019-12-17 NOTE — Progress Notes (Signed)
Patient ID: Shawn Hayes, male    DOB: 03-14-1955, 64 y.o.   MRN: [id number]  Congestive Heart Failure Associated symptoms include fatigue (worsening) and shortness of breath (very little). Pertinent negatives include no abdominal pain, chest pain or palpitations.    Shawn Hayes is a 64 y/o male with a history of DM, anemia, HTN, CKD, COPD, current tobacco use and chronic heart failure.   Echo report from 04/18/19 reviewed and showed an EF of 30-35% along with trivial Shawn and moderately elevated PA pressure.  Was in the ED 08/13/19 due to orthostatic hypotension. Given IVF and was released. Admitted 06/27/19 due to acute on chronic HF after failing outpatient diuresis. Initially placed on lasix drip and then transitioned to oral diuretics. Mildly elevated troponin thought to be due to demand ischemia. Discharged after 7 days. Admitted 04/17/19 due to acute on chronic heart failure with anasarca. Initially given IV lasix and then transitioned to oral diuretics. Spironolactone was held due to hyperkalemia. Given prednisone for COPD exacerbation. Discharged after 6 days.   He presents today for a follow-up visit with a chief complaint of minimal shortness of breath upon moderate exertion, "pounding" feeling in head, and fatigue. Patient reports he has not been watching his diet as he should and has been eating out frequently but denies adding salt to food intentionally.   Past Medical History:  Diagnosis Date  . Anemia   . BPH (benign prostatic hyperplasia)   . CHF (congestive heart failure) ([place])   . Chronic kidney disease   . Controlled type 2 diabetes mellitus without complication ([place])   . COPD (chronic obstructive pulmonary disease) ([place])   . Hypertension   . Idiopathic cardiomyopathy ([place])    Past Surgical History:  Procedure Laterality Date  . APPENDECTOMY    . TONSILLECTOMY     Family History  Problem Relation Age of Onset  . Cancer Brother   . Kidney failure Brother   . Kidney  cancer Neg Hx   . Bladder Cancer Neg Hx   . Prostate cancer Neg Hx    Social History   Tobacco Use  . Smoking status: Former Smoker    Types: Cigarettes  . Smokeless tobacco: Never Used  . Tobacco comment: Pt states he quit 1 week ago  Substance Use Topics  . Alcohol use: Not Currently   No Known Allergies  Prior to Admission medications   Medication Sig Start Date End Date Taking? Authorizing Provider  albuterol (VENTOLIN HFA) 108 (90 Base) MCG/ACT inhaler Inhale 2 puffs into the lungs every 6 (six) hours as needed for wheezing or shortness of breath. 04/23/19  Yes [name], [name], MD  budesonide-formoterol [facility]) 160-4.5 MCG/ACT inhaler Inhale 2 puffs into the lungs in the morning and at bedtime. 04/23/19  Yes [name], MD  carvedilol (COREG) 12.5 MG tablet Take 1 tablet (12.5 mg total) by mouth 2 (two) times daily with a meal. 07/04/19  Yes Lorella Nimrod, MD  glipiZIDE (GLUCOTROL) 5 MG tablet Take 1 tablet (5 mg total) by mouth 2 (two) times daily. 04/19/19 04/18/20 Yes [name], [name], MD  metFORMIN (GLUCOPHAGE-XR) 500 MG 24 hr tablet Take 500 mg by mouth daily.    Yes [provider]  Multiple Vitamin (MULTIVITAMIN WITH MINERALS) TABS tablet Take 1 tablet by mouth daily.   Yes [provider]  Omega-3 Fatty Acids (FISH OIL) 1000 MG CAPS Take 1 g by mouth daily.   Yes [provider]  rOPINIRole (REQUIP) 0.5 MG tablet Take  1 tablet (0.5 mg total) by mouth at bedtime. 04/19/19  Yes [name], [name], MD  tamsulosin (FLOMAX) 0.4 MG CAPS capsule Take 1 capsule (0.4 mg total) by mouth daily. Patient taking differently: Take 0.8 mg by mouth daily.  04/23/19  Yes [name], MD  tiotropium (SPIRIVA) 18 MCG inhalation capsule Place 1 capsule (18 mcg total) into inhaler and inhale daily. 04/23/19  Yes [name], [name], MD  torsemide (DEMADEX) 20 MG tablet Take 2 tablets (40 mg total) by mouth daily. Patient taking differently: Take 40 mg by mouth.   07/04/19  Yes Lorella Nimrod, MD  valsartan (DIOVAN) 40 MG tablet Take 1 tablet (40 mg total) by mouth daily. 08/14/19  Yes Avie Arenas, PA-C    Review of Systems  Constitutional: Positive for fatigue (worsening). Negative for appetite change.  HENT: Negative for congestion, postnasal drip and sore throat.   Eyes: Negative.   Respiratory: Positive for shortness of breath (very little). Negative for cough.   Cardiovascular: Positive for leg swelling (Right ankle swelling). Negative for chest pain and palpitations.  Gastrointestinal: Negative for abdominal distention, abdominal pain and diarrhea.  Endocrine: Negative.   Genitourinary: Negative.   Musculoskeletal: Negative for arthralgias, back pain and neck pain.  Skin: Negative.   Allergic/Immunologic: Negative.   Neurological: Positive for numbness (neuropathy in legs). Negative for dizziness and light-headedness.       Pounding in head  Hematological: Negative for adenopathy. Does not bruise/bleed easily.  Psychiatric/Behavioral: Positive for sleep disturbance (interrupted sleep; sleeping on 1 pillow). Negative for dysphoric mood. The patient is not nervous/anxious.    Vitals:   12/17/19 1158  BP: (!) 168/96  Pulse: 81  Resp: 18  SpO2: 100%  Weight: 211 lb (95.7 kg)  Height: 6\' 1"  (1.854 m)   Wt Readings from Last 3 Encounters:  12/17/19 211 lb (95.7 kg)  12/10/19 209 lb 6 oz (95 kg)  09/26/19 (!) 206 lb (93.4 kg)   Lab Results  Component Value Date   CREATININE 2.46 (H) 12/10/2019   CREATININE 1.97 (H) 08/14/2019   CREATININE 2.36 (H) 08/13/2019     Physical Exam Vitals and nursing note reviewed.  Constitutional:      Appearance: He is well-developed.  HENT:     Head: Normocephalic and atraumatic.  Neck:     Vascular: No JVD.  Cardiovascular:     Rate and Rhythm: Normal rate and regular rhythm.  Pulmonary:     Effort: Pulmonary effort is normal. No respiratory distress.     Breath sounds: No rhonchi or  rales.  Abdominal:     General: There is no distension.     Palpations: Abdomen is soft.     Tenderness: There is no abdominal tenderness.  Musculoskeletal:        General: No tenderness.     Cervical back: Normal range of motion and neck supple.     Right lower leg: No tenderness. Edema (1+ pitting) present.     Left lower leg: No tenderness. Edema (Trace pitting) present.  Skin:    General: Skin is warm and dry.  Neurological:     General: No focal deficit present.     Mental Status: He is alert and oriented to person, place, and time.  Psychiatric:        Mood and Affect: Mood normal.        Behavior: Behavior normal.     Assessment & Plan:  1: Chronic heart failure with reduced ejection fraction- - NYHA class  II - euvolemic today - Currently weight daily. Reminded to call for an overnight weight gain of >2 pounds or a weekly weight gain of >5 pounds - wearing compression boots which do help but says that he usually sits at a computer all day at work with his legs down - not adding salt to his food and is trying to follow a low sodium diet; Patient has been getting food from fast food places, often fried chicken he reports. Encouraged to make more health conscious decisions even when eating out, such as fish options or chicken that is baked or grilled.  -Patient is taking Delene Loll - saw cardiology Minette Brine) 08/12/19 - BNP 06/27/19 was 3153.0  2: HTN- - BP elevated 168/96; Reports 160's/80's consistently at home.  - saw PCP Ouida Sills) 09/12/19 - BMP 12/10/19 reviewed and showed sodium 138 , potassium 4.4, creatinine 2.46 and GFR 26, sent today for BMP re-draw. -Discussed at length signs and concerns of patient's kidney levels and Blood pressure problems. Patient to be referred to nephrology and continue to take only 20mg  of Torsemide. Patient agreeable with plan of care.  3: DM- - A1c 08/13/19 was 7.6%   Patient did not bring his medications nor a list. Each medication was  verbally reviewed with the patient and he was encouraged to bring the bottles to every visit to confirm accuracy of list.  Return in 3 months or sooner for any questions/problems before then.

## 2019-12-18 ENCOUNTER — Telehealth: Payer: Self-pay | Admitting: Internal Medicine

## 2019-12-18 NOTE — Telephone Encounter (Addendum)
Patient called to discuss BMP results. Kidney function slightly improved since last BMP on 10/12. Will continue with plan to see nephrology at this time and continue to closely monitor kidney function.

## 2019-12-18 NOTE — Telephone Encounter (Signed)
Agree with NP discussion with patient. Referral has been made to nephrology.

## 2020-01-14 DIAGNOSIS — Z23 Encounter for immunization: Secondary | ICD-10-CM | POA: Diagnosis not present

## 2020-01-14 DIAGNOSIS — N183 Chronic kidney disease, stage 3 unspecified: Secondary | ICD-10-CM | POA: Diagnosis not present

## 2020-01-14 DIAGNOSIS — I429 Cardiomyopathy, unspecified: Secondary | ICD-10-CM | POA: Diagnosis not present

## 2020-01-14 DIAGNOSIS — Z0000 Encounter for general adult medical examination without abnormal findings: Secondary | ICD-10-CM | POA: Diagnosis not present

## 2020-01-14 DIAGNOSIS — I700 Atherosclerosis of aorta: Secondary | ICD-10-CM | POA: Diagnosis not present

## 2020-01-14 DIAGNOSIS — J42 Unspecified chronic bronchitis: Secondary | ICD-10-CM | POA: Diagnosis not present

## 2020-01-14 DIAGNOSIS — E1122 Type 2 diabetes mellitus with diabetic chronic kidney disease: Secondary | ICD-10-CM | POA: Diagnosis not present

## 2020-01-14 DIAGNOSIS — Z1211 Encounter for screening for malignant neoplasm of colon: Secondary | ICD-10-CM | POA: Diagnosis not present

## 2020-01-28 ENCOUNTER — Inpatient Hospital Stay: Payer: Medicare HMO

## 2020-01-28 ENCOUNTER — Inpatient Hospital Stay
Admission: EM | Admit: 2020-01-28 | Discharge: 2020-01-29 | DRG: 982 | Disposition: A | Discharge status: Other Acute Inpatient Hospital | Payer: Medicare HMO | Attending: Internal Medicine | Admitting: Internal Medicine

## 2020-01-28 ENCOUNTER — Emergency Department: Payer: Medicare HMO

## 2020-01-28 ENCOUNTER — Other Ambulatory Visit: Payer: Self-pay

## 2020-01-28 DIAGNOSIS — E1122 Type 2 diabetes mellitus with diabetic chronic kidney disease: Secondary | ICD-10-CM | POA: Diagnosis present

## 2020-01-28 DIAGNOSIS — Z7951 Long term (current) use of inhaled steroids: Secondary | ICD-10-CM

## 2020-01-28 DIAGNOSIS — J449 Chronic obstructive pulmonary disease, unspecified: Secondary | ICD-10-CM | POA: Diagnosis present

## 2020-01-28 DIAGNOSIS — W292XXA Contact with other powered household machinery, initial encounter: Secondary | ICD-10-CM

## 2020-01-28 DIAGNOSIS — G2581 Restless legs syndrome: Secondary | ICD-10-CM | POA: Diagnosis present

## 2020-01-28 DIAGNOSIS — Z20822 Contact with and (suspected) exposure to covid-19: Secondary | ICD-10-CM | POA: Diagnosis present

## 2020-01-28 DIAGNOSIS — L089 Local infection of the skin and subcutaneous tissue, unspecified: Secondary | ICD-10-CM | POA: Diagnosis not present

## 2020-01-28 DIAGNOSIS — T25322A Burn of third degree of left foot, initial encounter: Secondary | ICD-10-CM | POA: Diagnosis present

## 2020-01-28 DIAGNOSIS — N179 Acute kidney failure, unspecified: Secondary | ICD-10-CM | POA: Diagnosis present

## 2020-01-28 DIAGNOSIS — Y92009 Unspecified place in unspecified non-institutional (private) residence as the place of occurrence of the external cause: Secondary | ICD-10-CM | POA: Diagnosis not present

## 2020-01-28 DIAGNOSIS — I739 Peripheral vascular disease, unspecified: Secondary | ICD-10-CM | POA: Diagnosis not present

## 2020-01-28 DIAGNOSIS — M7731 Calcaneal spur, right foot: Secondary | ICD-10-CM | POA: Diagnosis not present

## 2020-01-28 DIAGNOSIS — N400 Enlarged prostate without lower urinary tract symptoms: Secondary | ICD-10-CM | POA: Diagnosis present

## 2020-01-28 DIAGNOSIS — E1142 Type 2 diabetes mellitus with diabetic polyneuropathy: Secondary | ICD-10-CM | POA: Diagnosis not present

## 2020-01-28 DIAGNOSIS — M7121 Synovial cyst of popliteal space [Baker], right knee: Secondary | ICD-10-CM | POA: Diagnosis not present

## 2020-01-28 DIAGNOSIS — E871 Hypo-osmolality and hyponatremia: Secondary | ICD-10-CM | POA: Diagnosis not present

## 2020-01-28 DIAGNOSIS — I5042 Chronic combined systolic (congestive) and diastolic (congestive) heart failure: Secondary | ICD-10-CM | POA: Diagnosis present

## 2020-01-28 DIAGNOSIS — E860 Dehydration: Secondary | ICD-10-CM | POA: Diagnosis present

## 2020-01-28 DIAGNOSIS — I517 Cardiomegaly: Secondary | ICD-10-CM | POA: Diagnosis not present

## 2020-01-28 DIAGNOSIS — E1165 Type 2 diabetes mellitus with hyperglycemia: Secondary | ICD-10-CM | POA: Diagnosis not present

## 2020-01-28 DIAGNOSIS — I428 Other cardiomyopathies: Secondary | ICD-10-CM | POA: Diagnosis not present

## 2020-01-28 DIAGNOSIS — Z825 Family history of asthma and other chronic lower respiratory diseases: Secondary | ICD-10-CM

## 2020-01-28 DIAGNOSIS — Z7984 Long term (current) use of oral hypoglycemic drugs: Secondary | ICD-10-CM | POA: Diagnosis not present

## 2020-01-28 DIAGNOSIS — R2243 Localized swelling, mass and lump, lower limb, bilateral: Secondary | ICD-10-CM

## 2020-01-28 DIAGNOSIS — Z841 Family history of disorders of kidney and ureter: Secondary | ICD-10-CM

## 2020-01-28 DIAGNOSIS — L03116 Cellulitis of left lower limb: Secondary | ICD-10-CM | POA: Diagnosis not present

## 2020-01-28 DIAGNOSIS — T25321A Burn of third degree of right foot, initial encounter: Secondary | ICD-10-CM | POA: Diagnosis present

## 2020-01-28 DIAGNOSIS — N184 Chronic kidney disease, stage 4 (severe): Secondary | ICD-10-CM | POA: Diagnosis present

## 2020-01-28 DIAGNOSIS — I130 Hypertensive heart and chronic kidney disease with heart failure and stage 1 through stage 4 chronic kidney disease, or unspecified chronic kidney disease: Secondary | ICD-10-CM | POA: Diagnosis not present

## 2020-01-28 DIAGNOSIS — L97509 Non-pressure chronic ulcer of other part of unspecified foot with unspecified severity: Secondary | ICD-10-CM | POA: Diagnosis not present

## 2020-01-28 DIAGNOSIS — I96 Gangrene, not elsewhere classified: Secondary | ICD-10-CM | POA: Diagnosis not present

## 2020-01-28 DIAGNOSIS — L03115 Cellulitis of right lower limb: Secondary | ICD-10-CM | POA: Diagnosis not present

## 2020-01-28 DIAGNOSIS — J90 Pleural effusion, not elsewhere classified: Secondary | ICD-10-CM | POA: Diagnosis not present

## 2020-01-28 DIAGNOSIS — R059 Cough, unspecified: Secondary | ICD-10-CM | POA: Diagnosis not present

## 2020-01-28 DIAGNOSIS — Z87891 Personal history of nicotine dependence: Secondary | ICD-10-CM

## 2020-01-28 DIAGNOSIS — L03119 Cellulitis of unspecified part of limb: Secondary | ICD-10-CM | POA: Diagnosis not present

## 2020-01-28 DIAGNOSIS — J811 Chronic pulmonary edema: Secondary | ICD-10-CM | POA: Diagnosis not present

## 2020-01-28 DIAGNOSIS — E663 Overweight: Secondary | ICD-10-CM | POA: Diagnosis present

## 2020-01-28 DIAGNOSIS — R600 Localized edema: Secondary | ICD-10-CM | POA: Diagnosis not present

## 2020-01-28 DIAGNOSIS — E1140 Type 2 diabetes mellitus with diabetic neuropathy, unspecified: Secondary | ICD-10-CM | POA: Diagnosis not present

## 2020-01-28 DIAGNOSIS — R739 Hyperglycemia, unspecified: Secondary | ICD-10-CM

## 2020-01-28 DIAGNOSIS — D631 Anemia in chronic kidney disease: Secondary | ICD-10-CM | POA: Diagnosis present

## 2020-01-28 DIAGNOSIS — Z9049 Acquired absence of other specified parts of digestive tract: Secondary | ICD-10-CM

## 2020-01-28 DIAGNOSIS — I10 Essential (primary) hypertension: Secondary | ICD-10-CM | POA: Diagnosis not present

## 2020-01-28 DIAGNOSIS — Z872 Personal history of diseases of the skin and subcutaneous tissue: Secondary | ICD-10-CM | POA: Diagnosis not present

## 2020-01-28 DIAGNOSIS — Z6827 Body mass index (BMI) 27.0-27.9, adult: Secondary | ICD-10-CM

## 2020-01-28 DIAGNOSIS — M7989 Other specified soft tissue disorders: Secondary | ICD-10-CM | POA: Diagnosis not present

## 2020-01-28 DIAGNOSIS — E11628 Type 2 diabetes mellitus with other skin complications: Secondary | ICD-10-CM | POA: Diagnosis not present

## 2020-01-28 DIAGNOSIS — Z79899 Other long term (current) drug therapy: Secondary | ICD-10-CM

## 2020-01-28 DIAGNOSIS — M19071 Primary osteoarthritis, right ankle and foot: Secondary | ICD-10-CM | POA: Diagnosis not present

## 2020-01-28 DIAGNOSIS — E1152 Type 2 diabetes mellitus with diabetic peripheral angiopathy with gangrene: Secondary | ICD-10-CM | POA: Diagnosis present

## 2020-01-28 DIAGNOSIS — T300 Burn of unspecified body region, unspecified degree: Secondary | ICD-10-CM | POA: Diagnosis not present

## 2020-01-28 DIAGNOSIS — E11621 Type 2 diabetes mellitus with foot ulcer: Secondary | ICD-10-CM | POA: Diagnosis not present

## 2020-01-28 LAB — COMPREHENSIVE METABOLIC PANEL
ALT: 14 U/L (ref 0–44)
AST: 19 U/L (ref 15–41)
Albumin: 3.1 g/dL — ABNORMAL LOW (ref 3.5–5.0)
Alkaline Phosphatase: 72 U/L (ref 38–126)
Anion gap: 10 (ref 5–15)
BUN: 41 mg/dL — ABNORMAL HIGH (ref 8–23)
CO2: 20 mmol/L — ABNORMAL LOW (ref 22–32)
Calcium: 8.4 mg/dL — ABNORMAL LOW (ref 8.9–10.3)
Chloride: 103 mmol/L (ref 98–111)
Creatinine, Ser: 3.15 mg/dL — ABNORMAL HIGH (ref 0.61–1.24)
GFR, Estimated: 21 mL/min — ABNORMAL LOW (ref >60)
Glucose, Bld: 207 mg/dL — ABNORMAL HIGH (ref 70–99)
Potassium: 5.1 mmol/L (ref 3.5–5.1)
Sodium: 133 mmol/L — ABNORMAL LOW (ref 135–145)
Total Bilirubin: 0.9 mg/dL (ref 0.3–1.2)
Total Protein: 7.3 g/dL (ref 6.5–8.1)

## 2020-01-28 LAB — CBC WITH DIFFERENTIAL/PLATELET
Abs Immature Granulocytes: 0.05 10*3/uL (ref 0.00–0.07)
Basophils Absolute: 0.1 10*3/uL (ref 0.0–0.1)
Basophils Relative: 1 %
Eosinophils Absolute: 0.7 10*3/uL — ABNORMAL HIGH (ref 0.0–0.5)
Eosinophils Relative: 6 %
HCT: 27.4 % — ABNORMAL LOW (ref 39.0–52.0)
Hemoglobin: 9.1 g/dL — ABNORMAL LOW (ref 13.0–17.0)
Immature Granulocytes: 1 %
Lymphocytes Relative: 13 %
Lymphs Abs: 1.4 10*3/uL (ref 0.7–4.0)
MCH: 30 pg (ref 26.0–34.0)
MCHC: 33.2 g/dL (ref 30.0–36.0)
MCV: 90.4 fL (ref 80.0–100.0)
Monocytes Absolute: 0.7 10*3/uL (ref 0.1–1.0)
Monocytes Relative: 6 %
Neutro Abs: 7.7 10*3/uL (ref 1.7–7.7)
Neutrophils Relative %: 73 %
Platelets: 208 10*3/uL (ref 150–400)
RBC: 3.03 MIL/uL — ABNORMAL LOW (ref 4.22–5.81)
RDW: 13.2 % (ref 11.5–15.5)
WBC: 10.5 10*3/uL (ref 4.0–10.5)
nRBC: 0 % (ref 0.0–0.2)

## 2020-01-28 LAB — LACTIC ACID, PLASMA
Lactic Acid, Venous: 1.8 mmol/L (ref 0.5–1.9)
Lactic Acid, Venous: 1.5 mmol/L (ref 0.5–1.9)

## 2020-01-28 LAB — SEDIMENTATION RATE: Sed Rate: 105 mm/hr — ABNORMAL HIGH (ref 0–20)

## 2020-01-28 LAB — RESP PANEL BY RT-PCR (FLU A&B, COVID) ARPGX2
Influenza A by PCR: NEGATIVE
Influenza B by PCR: NEGATIVE
SARS Coronavirus 2 by RT PCR: NEGATIVE

## 2020-01-28 LAB — HEMOGLOBIN A1C
Hgb A1c MFr Bld: 6.9 % — ABNORMAL HIGH (ref 4.8–5.6)
Mean Plasma Glucose: 151.33 mg/dL

## 2020-01-28 LAB — GLUCOSE, CAPILLARY: Glucose-Capillary: 182 mg/dL — ABNORMAL HIGH (ref 70–99)

## 2020-01-28 LAB — ETHANOL: Alcohol, Ethyl (B): <10 mg/dL (ref <10)

## 2020-01-28 LAB — C-REACTIVE PROTEIN: CRP: 5.9 mg/dL — ABNORMAL HIGH (ref <1.0)

## 2020-01-28 LAB — CBG MONITORING, ED: Glucose-Capillary: 155 mg/dL — ABNORMAL HIGH (ref 70–99)

## 2020-01-28 MED ORDER — SODIUM CHLORIDE 0.9 % IV SOLN: INTRAVENOUS | Status: DC

## 2020-01-28 MED ORDER — LACTATED RINGERS IV BOLUS
1000.0000 mL | Freq: Once | INTRAVENOUS | Status: AC
  Administered 2020-01-28: 1000 mL via INTRAVENOUS

## 2020-01-28 MED ORDER — VANCOMYCIN HCL IN DEXTROSE 1-5 GM/200ML-% IV SOLN: 1000.0000 mg | Freq: Once | INTRAVENOUS | Status: DC

## 2020-01-28 MED ORDER — VANCOMYCIN HCL 750 MG/150ML IV SOLN
750.0000 mg | INTRAVENOUS | Status: DC
  Filled 2020-01-28: qty 150

## 2020-01-28 MED ORDER — OXYCODONE-ACETAMINOPHEN 7.5-325 MG PO TABS
1.0000 | ORAL_TABLET | Freq: Four times a day (QID) | ORAL | Status: DC | PRN
  Administered 2020-01-29 (×2): 1 via ORAL
  Filled 2020-01-28 (×2): qty 1

## 2020-01-28 MED ORDER — SODIUM CHLORIDE 0.9% FLUSH
3.0000 mL | Freq: Two times a day (BID) | INTRAVENOUS | Status: DC
  Administered 2020-01-28: 3 mL via INTRAVENOUS

## 2020-01-28 MED ORDER — ACETAMINOPHEN 325 MG PO TABS: 650.0000 mg | ORAL_TABLET | Freq: Four times a day (QID) | ORAL | Status: DC | PRN

## 2020-01-28 MED ORDER — IPRATROPIUM-ALBUTEROL 0.5-2.5 (3) MG/3ML IN SOLN: 3.0000 mL | Freq: Four times a day (QID) | RESPIRATORY_TRACT | Status: DC | PRN

## 2020-01-28 MED ORDER — INSULIN ASPART 100 UNIT/ML ~~LOC~~ SOLN
0.0000 [IU] | SUBCUTANEOUS | Status: DC
  Administered 2020-01-28 – 2020-01-29 (×2): 3 [IU] via SUBCUTANEOUS
  Administered 2020-01-29 (×2): 2 [IU] via SUBCUTANEOUS
  Administered 2020-01-29: 3 [IU] via SUBCUTANEOUS
  Filled 2020-01-28 (×5): qty 1

## 2020-01-28 MED ORDER — CARVEDILOL 12.5 MG PO TABS
12.5000 mg | ORAL_TABLET | Freq: Two times a day (BID) | ORAL | Status: DC
  Administered 2020-01-28 – 2020-01-29 (×3): 12.5 mg via ORAL
  Filled 2020-01-28 (×3): qty 1

## 2020-01-28 MED ORDER — SILVER SULFADIAZINE 1 % EX CREA
TOPICAL_CREAM | Freq: Every day | CUTANEOUS | Status: DC
  Filled 2020-01-28: qty 85

## 2020-01-28 MED ORDER — MORPHINE SULFATE (PF) 2 MG/ML IV SOLN
2.0000 mg | INTRAVENOUS | Status: DC | PRN
  Administered 2020-01-29 (×2): 2 mg via INTRAVENOUS
  Filled 2020-01-28 (×2): qty 1

## 2020-01-28 MED ORDER — PIPERACILLIN-TAZOBACTAM 3.375 G IVPB
3.3750 g | Freq: Three times a day (TID) | INTRAVENOUS | Status: DC
  Administered 2020-01-28 – 2020-01-29 (×2): 3.375 g via INTRAVENOUS
  Filled 2020-01-28 (×3): qty 50

## 2020-01-28 MED ORDER — VANCOMYCIN HCL 750 MG/150ML IV SOLN
750.0000 mg | INTRAVENOUS | Status: DC
  Administered 2020-01-29: 750 mg via INTRAVENOUS
  Filled 2020-01-28 (×2): qty 150

## 2020-01-28 MED ORDER — TAMSULOSIN HCL 0.4 MG PO CAPS
0.8000 mg | ORAL_CAPSULE | Freq: Every day | ORAL | Status: DC
  Administered 2020-01-29: 0.8 mg via ORAL
  Filled 2020-01-28: qty 2

## 2020-01-28 MED ORDER — ACETAMINOPHEN 650 MG RE SUPP: 650.0000 mg | Freq: Four times a day (QID) | RECTAL | Status: DC | PRN

## 2020-01-28 MED ORDER — VANCOMYCIN HCL 750 MG/150ML IV SOLN
750.0000 mg | Freq: Once | INTRAVENOUS | Status: AC
  Administered 2020-01-28: 750 mg via INTRAVENOUS
  Filled 2020-01-28: qty 150

## 2020-01-28 NOTE — Consult Note (Addendum)
PHARMACY -  BRIEF ANTIBIOTIC NOTE   Pharmacy has received consult(s) for Vancomycin from an ED provider.  The patient's profile has been reviewed for ht/wt/allergies/indication/available labs.    Shawn Hayes is a 64 y.o. male admitted on 01/28/2020 with cellulitis.  One time order(s) placed for Vancomycin 1750mg  x1 now (11/30)   Further antibiotics/pharmacy consults should be ordered by admitting physician if indicated.                       Thank you, Lorna Dibble 01/28/2020  5:07 PM

## 2020-01-28 NOTE — H&P (Signed)
History and Physical    Shawn Hayes KGY:[id number] DOB: 1955/11/01 DOA: 01/28/2020  PCP: [name], MD  Patient coming from: home    Chief Complaint:   HPI: 64 y/o M w/ PMH of uncontrolled DM2, likely peripheral neuropathy, HTN, CHF, CKD, BPH who presents w/ b/l foot pain and swelling x 1 day but thinks there is a chance that he did not notice the wounds on his feet earlier. The pain is dull, intermittent w/o radiation. Moving his feet & walking makes the pain worse and nothing makes the pain better. The severity is currently 8/10. Pt admits to falling often with last fall being 3 days ago. Pt has been falling for the past 1 year. Pt does not use a walker or cane. Pt denies walking around barefoot and states he always wears shoes. Pt also c/o chills, coughing x 2 days. The cough is productive w/ white sputum. Pt denies any fever, sweating, shortness of breath, nausea, vomiting, abd pain, dysuria, urinary urgency, urinary frequency, diarrhea, or constipation. Pt has had 2 COVID vaccines and influenza vaccine this year as per pt.   Review of Systems: As per HPI otherwise 14 point review of systems negative.    Past Medical History:  Diagnosis Date  . Anemia   . BPH (benign prostatic hyperplasia)   . CHF (congestive heart failure) ([place])   . Chronic kidney disease   . Controlled type 2 diabetes mellitus without complication ([place])   . COPD (chronic obstructive pulmonary disease) ([place])   . Hypertension   . Idiopathic cardiomyopathy ([place])     Past Surgical History:  Procedure Laterality Date  . APPENDECTOMY    . TONSILLECTOMY       reports that he has quit smoking. His smoking use included cigarettes. He has never used smokeless tobacco. He reports previous alcohol use. He reports previous drug use.  No Known Allergies  Family History  Problem Relation Age of Onset  . Cancer Brother   . Kidney failure Brother   . Kidney cancer Neg Hx   . Bladder Cancer Neg Hx   .  Prostate cancer Neg Hx      Prior to Admission medications   Medication Sig Start Date End Date Taking? Authorizing Provider  albuterol (VENTOLIN HFA) 108 (90 Base) MCG/ACT inhaler Inhale 2 puffs into the lungs every 6 (six) hours as needed for wheezing or shortness of breath. 04/23/19   [name], MD  budesonide-formoterol [facility]) 160-4.5 MCG/ACT inhaler Inhale 2 puffs into the lungs in the morning and at bedtime. 04/23/19   [name], MD  carvedilol (COREG) 12.5 MG tablet Take 1 tablet (12.5 mg total) by mouth 2 (two) times daily with a meal. 07/04/19   Lorella Nimrod, MD  ENTRESTO 24-26 MG  09/12/19   [provider]  metFORMIN (GLUCOPHAGE-XR) 500 MG 24 hr tablet Take 500 mg by mouth daily.     [provider]  Multiple Vitamin (MULTIVITAMIN WITH MINERALS) TABS tablet Take 1 tablet by mouth daily.    [provider]  Omega-3 Fatty Acids (FISH OIL) 1000 MG CAPS Take 1 g by mouth daily.    [provider]  tamsulosin (FLOMAX) 0.4 MG CAPS capsule Take 1 capsule (0.4 mg total) by mouth daily. Patient taking differently: Take 0.8 mg by mouth daily.  04/23/19   [name], MD  torsemide (DEMADEX) 20 MG tablet Take 2 tablets (40 mg total) by mouth daily. Patient taking differently: Take 20 mg by mouth daily.  09/10/19   [name], FNP    Physical Exam: Vitals:   01/28/20 1351 01/28/20 1352  BP:  (!) 144/53  Pulse:  60  Resp:  18  Temp:  98 F (36.7 C)  SpO2:  100%  Weight: 96.2 kg   Height: 6\' 1"  (1.854 m)     Constitutional: NAD, calm but uncomfortable. Appears older than stated age. Disheveled  Vitals:   01/28/20 1351 01/28/20 1352  BP:  (!) 144/53  Pulse:  60  Resp:  18  Temp:  98 F (36.7 C)  SpO2:  100%  Weight: 96.2 kg   Height: 6\' 1"  (1.854 m)    Eyes: PERRL, lids and conjunctivae normal ENMT: Mucous membranes are moist.  Neck: normal, supple Respiratory: diminished breath sounds b/l. No wheezes   Cardiovascular: S1 & S2+. No clicks, rubs or gallops Abdomen: soft, non-tender, obese, & normal bowel sounds  Musculoskeletal: limited ROM of b/l LE.  Skin: lesions on b/l feet. Necrosis of L & R foot of all 5 toes, open ulcerations of b/l LE   Neurologic: CN 2-12 grossly intact. Decreased ROM of b/l LE  Psychiatric: Poor judgment and insight. Alert and oriented x 3. Flat mood and affect     Labs on Admission: I have personally reviewed following labs and imaging studies  CBC: Recent Labs  Lab 01/28/20 1335  WBC 10.5  NEUTROABS 7.7  HGB 9.1*  HCT 27.4*  MCV 90.4  PLT 782   Basic Metabolic Panel: Recent Labs  Lab 01/28/20 1335  NA 133*  K 5.1  CL 103  CO2 20*  GLUCOSE 207*  BUN 41*  CREATININE 3.15*  CALCIUM 8.4*   GFR: Estimated Creatinine Clearance: 28.6 mL/min (A) (by C-G formula based on SCr of 3.15 mg/dL (H)). Liver Function Tests: Recent Labs  Lab 01/28/20 1335  AST 19  ALT 14  ALKPHOS 72  BILITOT 0.9  PROT 7.3  ALBUMIN 3.1*   No results for input(s): LIPASE, AMYLASE in the last 168 hours. No results for input(s): AMMONIA in the last 168 hours. Coagulation Profile: No results for input(s): INR, PROTIME in the last 168 hours. Cardiac Enzymes: No results for input(s): CKTOTAL, CKMB, CKMBINDEX, TROPONINI in the last 168 hours. BNP (last 3 results) No results for input(s): PROBNP in the last 8760 hours. HbA1C: No results for input(s): HGBA1C in the last 72 hours. CBG: Recent Labs  Lab 01/28/20 1518  GLUCAP 155*   Lipid Profile: No results for input(s): CHOL, HDL, LDLCALC, TRIG, CHOLHDL, LDLDIRECT in the last 72 hours. Thyroid Function Tests: No results for input(s): TSH, T4TOTAL, FREET4, T3FREE, THYROIDAB in the last 72 hours. Anemia Panel: No results for input(s): VITAMINB12, FOLATE, FERRITIN, TIBC, IRON, RETICCTPCT in the last 72 hours. Urine analysis:    Component Value Date/Time   COLORURINE STRAW (A) 08/13/2019 2200   APPEARANCEUR  Clear 10/15/2019 1156   LABSPEC 1.009 08/13/2019 2200   PHURINE 6.0 08/13/2019 2200   GLUCOSEU 2+ (A) 10/15/2019 1156   HGBUR SMALL (A) 08/13/2019 2200   BILIRUBINUR Negative 10/15/2019 1156   KETONESUR NEGATIVE 08/13/2019 2200   PROTEINUR 3+ (A) 10/15/2019 1156   PROTEINUR 100 (A) 08/13/2019 2200   NITRITE Negative 10/15/2019 1156   NITRITE NEGATIVE 08/13/2019 2200   LEUKOCYTESUR Negative 10/15/2019 1156   LEUKOCYTESUR NEGATIVE 08/13/2019 2200    Radiological Exams on Admission: DG Foot Complete Left  Result Date: 01/28/2020 CLINICAL DATA:  BILATERAL foot pain since yesterday, swelling, diabetes mellitus, ulcers LEFT foot  with discharge, redness and bleeding EXAM: LEFT FOOT - COMPLETE 3+ VIEW COMPARISON:  None FINDINGS: Osseous demineralization. Diffuse soft tissue swelling. Joint spaces preserved. No acute fracture, dislocation, or bone destruction. Small vessel vascular calcifications. Dressing artifacts at plantar aspect of the foot at the level of the MTP joints along the lateral margin. IMPRESSION: No acute osseous abnormalities. Electronically Signed   By: Lavonia Dana M.D.   On: 01/28/2020 14:56   DG Foot Complete Right  Result Date: 01/28/2020 CLINICAL DATA:  BILATERAL foot pain since yesterday, swelling, diabetes mellitus, ulcers LEFT foot with discharge, redness and bleeding EXAM: RIGHT FOOT COMPLETE - 3+ VIEW COMPARISON:  None FINDINGS: Osseous demineralization. Small erosions at IP joint great toe. Joint space narrowing first MCP joint. Corticated non fused ossicle at tip of lateral malleolus. No acute fracture, dislocation, or bone destruction. Diffuse soft tissue swelling with scattered small vessel vascular calcifications. Minimal calcaneal spurring. IMPRESSION: Degenerative changes at first MTP joint. Small erosions at the IP joint great toe, could represent an inflammatory arthropathy or gout. No acute osseous abnormalities. Electronically Signed   By: Lavonia Dana M.D.   On:  01/28/2020 14:54            EKG: Independently reviewed.   Assessment/Plan Active Problems:   * No active hospital problems. *   B/l foot infection: severe. Continue on IV vanco and started on IV zosyn. Blood cxs pending. Podiatry consulted, will possibly need amputation. XR of left & right foot does not show any fractures/dislocations. Morphine, percocet prn for pain. Hx of uncontrolled DM2, HbA1c is pending. Lactic acid is WNL. NPO until podiatry sees the pt.   Left leg swelling: [place] of b/l LE to r/o DVT.   DM2: uncontrolled. HbA1c is pending. Continue on SSI w/ accuchecks. Hold home dose of metformin.   Overweight: BMI 27.9. Would benefit from weight loss  AKI on CKD: currently stage IV. Baseline Cr/GFR is unknown. If Cr continues to rise, consider consulting nephro. Started on gentle IVFs as pt has hx of CHF  Hyponatremia: likely secondary to dehydration. Started on gentle IVFs as pt has hx of CHF  ACD: likely secondary to CKD. No need for a transfusion currently  BPH: continue on home dose of tamsulosin  HTN: will continue on home dose of carvedilol. Will hold home dose of torsemide, entresto secondary to possible AKI on CKD  Chronic combined CHF: last echo in Feb 2021 showed EF of 83-72%, grade I diastolic dysfunction & LV global hypokinesis. Will hold home dose of entresto, torsemide secondary to AKI on CKD. Monitor I/Os.    DVT prophylaxis: SCDs Code Status: full  Family Communication:  Disposition Plan: possibly SNF but depends on PT/OT recs (not consulted yet) Consults called: podiatry, Dr. Cleda Mccreedy  Admission status: inpatient    Wyvonnia Dusky MD [facility] Pager [phone]   If 7PM-7AM, please contact night-coverage  01/28/2020, 3:24 PM

## 2020-01-28 NOTE — ED Notes (Signed)
Xr at bedside

## 2020-01-28 NOTE — Consult Note (Addendum)
Pharmacy Antibiotic Note  Shawn Hayes is a 64 y.o. male admitted on 01/28/2020 with cellulitis.  Pharmacy has been consulted for Vancomycin dosing.  Plan: Vancomycin loading dose of 1750mg  x1 now (11/30) ; then maintenance dosing starting 12/1 1000 as shown below. Vancomycin 750 mg IV every 24 hours.  Goal trough 10-15 mcg/mL.  Height: 6\' 1"  (185.4 cm) Weight: 96.2 kg (212 lb) IBW/kg (Calculated) : 79.9  Temp (24hrs), Avg:98 F (36.7 C), Min:98 F (36.7 C), Max:98 F (36.7 C)  Last Labs       Recent Labs  Lab 01/28/20 1335 01/28/20 1358  WBC 10.5  --   CREATININE 3.15*  --   LATICACIDVEN  --  1.8      Estimated Creatinine Clearance: 28.6 mL/min (A) (by C-G formula based on SCr of 3.15 mg/dL (H)).    No Known Allergies  Antimicrobials this admission: Vancomycin 1.75gx1; 750mg  q24h (11/30 >> Zosyn 3.375g q8h (11/30>>   Microbiology results: 11/30 BCx: NGTD  Thank you for allowing pharmacy to be a part of this patient's care.  Shawn Hayes 01/28/2020 3:20 PM

## 2020-01-28 NOTE — ED Provider Notes (Addendum)
[facility] Emergency Department Provider Note  ____________________________________________   First MD Initiated Contact with Patient 01/28/20 1408     (approximate)  I have reviewed the triage vital signs and the nursing notes.   HISTORY  Chief Complaint foot pain   HPI Shawn Hayes is a 64 y.o. male with medical history significant of CHF EF 30%type 2 diabetes mellitus, hypertension, CKD stage IIIb,nonischemic cardiomyopathy, BPH, anemia, and COPD who presents for assessment of bilateral foot pain and skin peeling started last night.  Patient denies any recent injuries or falls.  He states 2 days ago his feet felt fine and had not have any peeling or discoloration or pain.  He states that otherwise he has not had any other acute symptoms other than pain and peeling in his feet as well as some swelling in the left foot.  Specifically denies any fevers, chills, cough, nausea, vomiting, diarrhea, dysuria, chest pain, back pain, upper extremity pain or pain proximal to the ankles in the legs including at the knees or hips.  States he had some blistering several months ago that started a little bit similar but never got as bad as it is today.  No clear alleviating or aggravating factors.  Denies exposure to any cold temperatures or chemicals that he can think of.  He does think he started a new blood pressure medicine a couple weeks ago but is not sure what was called.         Past Medical History:  Diagnosis Date  . Anemia   . BPH (benign prostatic hyperplasia)   . CHF (congestive heart failure) ([place])   . Chronic kidney disease   . Controlled type 2 diabetes mellitus without complication ([place])   . COPD (chronic obstructive pulmonary disease) ([place])   . Hypertension   . Idiopathic cardiomyopathy [facility])     Patient Active Problem List   Diagnosis Date Noted  . Orthostatic hypotension 08/13/2019  . Syncopal episodes 08/13/2019  . COPD (chronic  obstructive pulmonary disease) ([place]) 08/13/2019  . Hypomagnesemia 08/13/2019  . Acute on chronic combined systolic and diastolic CHF (congestive heart failure) ([place]) 06/27/2019  . Hyperkalemia   . Acute kidney injury superimposed on CKD ([place])   . COPD with acute exacerbation ([place])   . Benign prostatic hyperplasia with urinary hesitancy   . Essential hypertension   . Acute on chronic HFrEF (heart failure with reduced ejection fraction) ([place]) 04/18/2019  . Idiopathic cardiomyopathy ([place])   . Controlled type 2 diabetes mellitus without complication ([place])   . CKD stage 3 due to type 2 diabetes mellitus ([place])   . Weakness   . RLS (restless legs syndrome)   . Normocytic anemia 11/28/2018    Past Surgical History:  Procedure Laterality Date  . APPENDECTOMY    . TONSILLECTOMY      Prior to Admission medications   Medication Sig Start Date End Date Taking? Authorizing Provider  albuterol (VENTOLIN HFA) 108 (90 Base) MCG/ACT inhaler Inhale 2 puffs into the lungs every 6 (six) hours as needed for wheezing or shortness of breath. 04/23/19   [name], MD  budesonide-formoterol [facility]) 160-4.5 MCG/ACT inhaler Inhale 2 puffs into the lungs in the morning and at bedtime. 04/23/19   [name], MD  carvedilol (COREG) 12.5 MG tablet Take 1 tablet (12.5 mg total) by mouth 2 (two) times daily with a meal. 07/04/19   Lorella Nimrod, MD  ENTRESTO 24-26 MG  09/12/19   [provider]  metFORMIN (  GLUCOPHAGE-XR) 500 MG 24 hr tablet Take 500 mg by mouth daily.     [provider]  Multiple Vitamin (MULTIVITAMIN WITH MINERALS) TABS tablet Take 1 tablet by mouth daily.    [provider]  Omega-3 Fatty Acids (FISH OIL) 1000 MG CAPS Take 1 g by mouth daily.    [provider]  tamsulosin (FLOMAX) 0.4 MG CAPS capsule Take 1 capsule (0.4 mg total) by mouth daily. Patient taking differently: Take 0.8 mg by mouth daily.  04/23/19   [name], MD  torsemide  (DEMADEX) 20 MG tablet Take 2 tablets (40 mg total) by mouth daily. Patient taking differently: Take 20 mg by mouth daily.  09/10/19   [name], FNP    Allergies Patient has no known allergies.  Family History  Problem Relation Age of Onset  . Cancer Brother   . Kidney failure Brother   . Kidney cancer Neg Hx   . Bladder Cancer Neg Hx   . Prostate cancer Neg Hx     Social History Social History   Tobacco Use  . Smoking status: Former Smoker    Types: Cigarettes  . Smokeless tobacco: Never Used  . Tobacco comment: Pt states he quit 1 week ago  Substance Use Topics  . Alcohol use: Not Currently  . Drug use: Not Currently    Review of Systems  Review of Systems  Constitutional: Negative for chills and fever.  HENT: Negative for sore throat.   Eyes: Negative for pain.  Respiratory: Negative for cough and stridor.   Cardiovascular: Negative for chest pain.  Gastrointestinal: Negative for vomiting.  Genitourinary: Negative for dysuria.  Musculoskeletal: Negative for myalgias.  Skin: Negative for rash.  Neurological: Negative for seizures, loss of consciousness and headaches.  Psychiatric/Behavioral: Negative for suicidal ideas.  All other systems reviewed and are negative.     ____________________________________________   PHYSICAL EXAM:  VITAL SIGNS: ED Triage Vitals  Enc Vitals Group     BP 01/28/20 1352 (!) 144/53     Pulse Rate 01/28/20 1352 60     Resp 01/28/20 1352 18     Temp 01/28/20 1352 98 F (36.7 C)     Temp src --      SpO2 01/28/20 1352 100 %     Weight 01/28/20 1351 212 lb (96.2 kg)     Height 01/28/20 1351 6' 1"  (1.854 m)     Head Circumference --      Peak Flow --      Pain Score 01/28/20 1351 10     Pain Loc --      Pain Edu? --      Excl. in [place]? --    Vitals:   01/28/20 1515 01/28/20 1520  BP: 129/63   Pulse: 63 66  Resp: 16   Temp:    SpO2: 99% 100%   Physical Exam Vitals and nursing note reviewed.  Constitutional:       Appearance: He is well-developed.  HENT:     Head: Normocephalic and atraumatic.     Right Ear: External ear normal.     Left Ear: External ear normal.     Nose: Nose normal.     Mouth/Throat:     Mouth: Mucous membranes are dry.  Eyes:     Conjunctiva/sclera: Conjunctivae normal.  Cardiovascular:     Rate and Rhythm: Normal rate and regular rhythm.     Heart sounds: No murmur heard.   Pulmonary:  Effort: Pulmonary effort is normal. No respiratory distress.     Breath sounds: Normal breath sounds.  Abdominal:     Palpations: Abdomen is soft.     Tenderness: There is no abdominal tenderness.  Musculoskeletal:     Cervical back: Neck supple.     Right lower leg: Edema present.     Left lower leg: Edema present.  Skin:    General: Skin is warm and dry.     Capillary Refill: Capillary refill takes 2 to 3 seconds.  Neurological:     Mental Status: He is alert and oriented to person, place, and time.  Psychiatric:        Mood and Affect: Mood normal.           Patient has significant sloughing of skin and erosion over the distal feet bilaterally encompassing all of the toes and the dorsum of the left foot.  There is significant tenderness erythema warmth and edema on the left extending up to the ankle with slightly less edema warmth and tenderness in the right but there is some on the dorsum of the right foot.  2+ bilateral DP pulses.  Patient is able to plantar and dorsiflex his feet although with significant pain.  He is unremarkable bilaterally. ____________________________________________   LABS (all labs ordered are listed, but only abnormal results are displayed)  Labs Reviewed  COMPREHENSIVE METABOLIC PANEL - Abnormal; Notable for the following components:      Result Value   Sodium 133 (*)    CO2 20 (*)    Glucose, Bld 207 (*)    BUN 41 (*)    Creatinine, Ser 3.15 (*)    Calcium 8.4 (*)    Albumin 3.1 (*)    GFR, Estimated 21 (*)    All other  components within normal limits  CBC WITH DIFFERENTIAL/PLATELET - Abnormal; Notable for the following components:   RBC 3.03 (*)    Hemoglobin 9.1 (*)    HCT 27.4 (*)    Eosinophils Absolute 0.7 (*)    All other components within normal limits  SEDIMENTATION RATE - Abnormal; Notable for the following components:   Sed Rate 105 (*)    All other components within normal limits  CBG MONITORING, ED - Abnormal; Notable for the following components:   Glucose-Capillary 155 (*)    All other components within normal limits  RESP PANEL BY RT-PCR (FLU A&B, COVID) ARPGX2  CULTURE, BLOOD (ROUTINE X 2)  CULTURE, BLOOD (ROUTINE X 2)  LACTIC ACID, PLASMA  LACTIC ACID, PLASMA  URINALYSIS, COMPLETE (UACMP) WITH MICROSCOPIC  C-REACTIVE PROTEIN  HEMOGLOBIN A1C   ____________________________________________   ____________________________________________  RADIOLOGY  ED MD interpretation: No acute fracture or dislocation of bilateral feet.  Official radiology report(s): DG Foot Complete Left  Result Date: 01/28/2020 CLINICAL DATA:  BILATERAL foot pain since yesterday, swelling, diabetes mellitus, ulcers LEFT foot with discharge, redness and bleeding EXAM: LEFT FOOT - COMPLETE 3+ VIEW COMPARISON:  None FINDINGS: Osseous demineralization. Diffuse soft tissue swelling. Joint spaces preserved. No acute fracture, dislocation, or bone destruction. Small vessel vascular calcifications. Dressing artifacts at plantar aspect of the foot at the level of the MTP joints along the lateral margin. IMPRESSION: No acute osseous abnormalities. Electronically Signed   By: Lavonia Dana M.D.   On: 01/28/2020 14:56   DG Foot Complete Right  Result Date: 01/28/2020 CLINICAL DATA:  BILATERAL foot pain since yesterday, swelling, diabetes mellitus, ulcers LEFT foot with discharge, redness and bleeding EXAM: RIGHT  FOOT COMPLETE - 3+ VIEW COMPARISON:  None FINDINGS: Osseous demineralization. Small erosions at IP joint great  toe. Joint space narrowing first MCP joint. Corticated non fused ossicle at tip of lateral malleolus. No acute fracture, dislocation, or bone destruction. Diffuse soft tissue swelling with scattered small vessel vascular calcifications. Minimal calcaneal spurring. IMPRESSION: Degenerative changes at first MTP joint. Small erosions at the IP joint great toe, could represent an inflammatory arthropathy or gout. No acute osseous abnormalities. Electronically Signed   By: Lavonia Dana M.D.   On: 01/28/2020 14:54    ____________________________________________   PROCEDURES  Procedure(s) performed (including Critical Care):  .1-3 Lead EKG Interpretation Performed by: Lucrezia Starch, MD Authorized by: Lucrezia Starch, MD     Interpretation: normal     ECG rate assessment: normal     Rhythm: sinus rhythm     Ectopy: none     Conduction: normal       ____________________________________________   INITIAL IMPRESSION / ASSESSMENT AND PLAN / ED COURSE      Patient presents with [place] to history exam for assessment of skin breakdown redness swelling warmth and pain to the bilateral feet as described above.  He is afebrile and hemodynamically stable on arrival.  No history of trauma.  Presentation is not consistent with DVT.  Patient has decreased sensation of his toes but otherwise has palpable pulses in the dorsum of both feet.  Impression is likely severe cellulitis.  Patient is adamant he does not been out in the cold or exposed to any new liquid substances or medications in the last couple of days.  Denies any overt burns.  Patient has severe cellulitis.  CMP remarkable for hyperglycemia with a glucose of 207 as well as an AKI on CKD as patient's creatinine is 3.15 compared to 2.28 last month with no other significant ocular metabolic derangements.  CBC shows no leukocytosis but does show evidence of anemia with hemoglobin 9.1 compared to 10.55 months ago.  ESR is elevated at 105.  Lactic acid  is normal at 1.8.  Discussed patient's presentation with on-call podiatrist Dr. Cleda Mccreedy who agreed with antibiotics admission stated he would see the patient later today.  We will plan to admit the patient to medicine service for further evaluation management.  IV antibiotics, IV fluids, and insulin ordered while patient in the ED   ____________________________________________   FINAL CLINICAL IMPRESSION(S) / ED DIAGNOSES  Final diagnoses:  Cellulitis of lower extremity, unspecified laterality  AKI (acute kidney injury) ([place])  Hyperglycemia    Medications  insulin aspart (novoLOG) injection 0-15 Units (has no administration in time range)  vancomycin (VANCOCIN) IVPB 1000 mg/200 mL premix (has no administration in time range)    Followed by  vancomycin (VANCOREADY) IVPB 750 mg/150 mL (has no administration in time range)  vancomycin (VANCOREADY) IVPB 750 mg/150 mL (has no administration in time range)  lactated ringers bolus 1,000 mL (1,000 mLs Intravenous New Bag/Given 01/28/20 1526)     ED Discharge Orders    None       Note:  This document was prepared using Dragon voice recognition software and may include unintentional dictation errors.   Lucrezia Starch, MD 01/28/20 1538    Lucrezia Starch, MD 01/28/20 [id number]

## 2020-01-28 NOTE — ED Triage Notes (Addendum)
Pt comes via POV from with c/o bilateral foot pain. Pt states this just started yesterday and now they are black. Pt states swelling and severe pain. Pt is diabetic.  Pt has severe open ulcers noted to left foot. Pt's skin is peeling down on left bottom of foot. Pt has redness and bleeding. Foul odor present. Skin discoloration noted to be grayish around toes. Both feet are severely swollen. Discharge noted with yellow pus. Pt states he has a fever right now.
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
## 2020-01-29 ENCOUNTER — Encounter: Payer: Self-pay | Admitting: Internal Medicine

## 2020-01-29 ENCOUNTER — Encounter
Admission: EM | Disposition: A | Discharge status: Other Acute Inpatient Hospital | Payer: Self-pay | Source: Home / Self Care | Attending: Internal Medicine

## 2020-01-29 ENCOUNTER — Other Ambulatory Visit (INDEPENDENT_AMBULATORY_CARE_PROVIDER_SITE_OTHER): Payer: Self-pay | Admitting: Vascular Surgery

## 2020-01-29 DIAGNOSIS — L03119 Cellulitis of unspecified part of limb: Secondary | ICD-10-CM

## 2020-01-29 DIAGNOSIS — D649 Anemia, unspecified: Secondary | ICD-10-CM | POA: Diagnosis not present

## 2020-01-29 DIAGNOSIS — I340 Nonrheumatic mitral (valve) insufficiency: Secondary | ICD-10-CM | POA: Diagnosis not present

## 2020-01-29 DIAGNOSIS — I10 Essential (primary) hypertension: Secondary | ICD-10-CM | POA: Diagnosis not present

## 2020-01-29 DIAGNOSIS — E11628 Type 2 diabetes mellitus with other skin complications: Secondary | ICD-10-CM

## 2020-01-29 DIAGNOSIS — R310 Gross hematuria: Secondary | ICD-10-CM | POA: Diagnosis not present

## 2020-01-29 DIAGNOSIS — R001 Bradycardia, unspecified: Secondary | ICD-10-CM | POA: Diagnosis not present

## 2020-01-29 DIAGNOSIS — L03116 Cellulitis of left lower limb: Secondary | ICD-10-CM | POA: Diagnosis not present

## 2020-01-29 DIAGNOSIS — I739 Peripheral vascular disease, unspecified: Secondary | ICD-10-CM

## 2020-01-29 DIAGNOSIS — E118 Type 2 diabetes mellitus with unspecified complications: Secondary | ICD-10-CM | POA: Diagnosis not present

## 2020-01-29 DIAGNOSIS — T300 Burn of unspecified body region, unspecified degree: Secondary | ICD-10-CM

## 2020-01-29 DIAGNOSIS — E1140 Type 2 diabetes mellitus with diabetic neuropathy, unspecified: Secondary | ICD-10-CM | POA: Diagnosis not present

## 2020-01-29 DIAGNOSIS — T25321A Burn of third degree of right foot, initial encounter: Secondary | ICD-10-CM | POA: Diagnosis not present

## 2020-01-29 DIAGNOSIS — E1165 Type 2 diabetes mellitus with hyperglycemia: Secondary | ICD-10-CM | POA: Diagnosis not present

## 2020-01-29 DIAGNOSIS — R1031 Right lower quadrant pain: Secondary | ICD-10-CM | POA: Diagnosis not present

## 2020-01-29 DIAGNOSIS — I509 Heart failure, unspecified: Secondary | ICD-10-CM | POA: Diagnosis not present

## 2020-01-29 DIAGNOSIS — D631 Anemia in chronic kidney disease: Secondary | ICD-10-CM | POA: Diagnosis not present

## 2020-01-29 DIAGNOSIS — S91302A Unspecified open wound, left foot, initial encounter: Secondary | ICD-10-CM | POA: Diagnosis not present

## 2020-01-29 DIAGNOSIS — Z01818 Encounter for other preprocedural examination: Secondary | ICD-10-CM | POA: Diagnosis not present

## 2020-01-29 DIAGNOSIS — I5020 Unspecified systolic (congestive) heart failure: Secondary | ICD-10-CM | POA: Diagnosis not present

## 2020-01-29 DIAGNOSIS — R918 Other nonspecific abnormal finding of lung field: Secondary | ICD-10-CM | POA: Diagnosis not present

## 2020-01-29 DIAGNOSIS — R638 Other symptoms and signs concerning food and fluid intake: Secondary | ICD-10-CM | POA: Diagnosis not present

## 2020-01-29 DIAGNOSIS — N183 Chronic kidney disease, stage 3 unspecified: Secondary | ICD-10-CM | POA: Diagnosis not present

## 2020-01-29 DIAGNOSIS — T25221A Burn of second degree of right foot, initial encounter: Secondary | ICD-10-CM | POA: Diagnosis not present

## 2020-01-29 DIAGNOSIS — I130 Hypertensive heart and chronic kidney disease with heart failure and stage 1 through stage 4 chronic kidney disease, or unspecified chronic kidney disease: Secondary | ICD-10-CM | POA: Diagnosis not present

## 2020-01-29 DIAGNOSIS — T25322A Burn of third degree of left foot, initial encounter: Secondary | ICD-10-CM | POA: Diagnosis not present

## 2020-01-29 DIAGNOSIS — Z01810 Encounter for preprocedural cardiovascular examination: Secondary | ICD-10-CM | POA: Diagnosis not present

## 2020-01-29 DIAGNOSIS — I5022 Chronic systolic (congestive) heart failure: Secondary | ICD-10-CM | POA: Diagnosis not present

## 2020-01-29 DIAGNOSIS — R079 Chest pain, unspecified: Secondary | ICD-10-CM | POA: Diagnosis not present

## 2020-01-29 DIAGNOSIS — E871 Hypo-osmolality and hyponatremia: Secondary | ICD-10-CM | POA: Diagnosis not present

## 2020-01-29 DIAGNOSIS — L0390 Cellulitis, unspecified: Secondary | ICD-10-CM | POA: Diagnosis not present

## 2020-01-29 DIAGNOSIS — T25331A Burn of third degree of right toe(s) (nail), initial encounter: Secondary | ICD-10-CM | POA: Diagnosis not present

## 2020-01-29 DIAGNOSIS — E875 Hyperkalemia: Secondary | ICD-10-CM | POA: Diagnosis not present

## 2020-01-29 DIAGNOSIS — J449 Chronic obstructive pulmonary disease, unspecified: Secondary | ICD-10-CM | POA: Diagnosis not present

## 2020-01-29 DIAGNOSIS — R059 Cough, unspecified: Secondary | ICD-10-CM | POA: Diagnosis not present

## 2020-01-29 DIAGNOSIS — E870 Hyperosmolality and hypernatremia: Secondary | ICD-10-CM | POA: Diagnosis not present

## 2020-01-29 DIAGNOSIS — I517 Cardiomegaly: Secondary | ICD-10-CM | POA: Diagnosis not present

## 2020-01-29 DIAGNOSIS — Z7984 Long term (current) use of oral hypoglycemic drugs: Secondary | ICD-10-CM | POA: Diagnosis not present

## 2020-01-29 DIAGNOSIS — N179 Acute kidney failure, unspecified: Secondary | ICD-10-CM | POA: Diagnosis not present

## 2020-01-29 DIAGNOSIS — L03115 Cellulitis of right lower limb: Secondary | ICD-10-CM | POA: Diagnosis not present

## 2020-01-29 DIAGNOSIS — T310 Burns involving less than 10% of body surface: Secondary | ICD-10-CM | POA: Diagnosis not present

## 2020-01-29 DIAGNOSIS — T25332A Burn of third degree of left toe(s) (nail), initial encounter: Secondary | ICD-10-CM | POA: Diagnosis not present

## 2020-01-29 DIAGNOSIS — N184 Chronic kidney disease, stage 4 (severe): Secondary | ICD-10-CM | POA: Diagnosis not present

## 2020-01-29 DIAGNOSIS — R339 Retention of urine, unspecified: Secondary | ICD-10-CM | POA: Diagnosis not present

## 2020-01-29 DIAGNOSIS — E119 Type 2 diabetes mellitus without complications: Secondary | ICD-10-CM | POA: Diagnosis not present

## 2020-01-29 DIAGNOSIS — N189 Chronic kidney disease, unspecified: Secondary | ICD-10-CM | POA: Diagnosis not present

## 2020-01-29 DIAGNOSIS — E1122 Type 2 diabetes mellitus with diabetic chronic kidney disease: Secondary | ICD-10-CM | POA: Diagnosis not present

## 2020-01-29 DIAGNOSIS — J811 Chronic pulmonary edema: Secondary | ICD-10-CM | POA: Diagnosis not present

## 2020-01-29 DIAGNOSIS — R531 Weakness: Secondary | ICD-10-CM | POA: Diagnosis not present

## 2020-01-29 HISTORY — PX: LOWER EXTREMITY ANGIOGRAPHY: CATH118251

## 2020-01-29 LAB — BLOOD CULTURE ID PANEL (REFLEXED) - BCID2

## 2020-01-29 LAB — CBC
HCT: 23.5 % — ABNORMAL LOW (ref 39.0–52.0)
Hemoglobin: 7.8 g/dL — ABNORMAL LOW (ref 13.0–17.0)
MCH: 30.2 pg (ref 26.0–34.0)
MCHC: 33.2 g/dL (ref 30.0–36.0)
MCV: 91.1 fL (ref 80.0–100.0)
Platelets: 174 10*3/uL (ref 150–400)
RBC: 2.58 MIL/uL — ABNORMAL LOW (ref 4.22–5.81)
RDW: 13 % (ref 11.5–15.5)
WBC: 9.5 10*3/uL (ref 4.0–10.5)
nRBC: 0 % (ref 0.0–0.2)

## 2020-01-29 LAB — BASIC METABOLIC PANEL
Anion gap: 8 (ref 5–15)
BUN: 44 mg/dL — ABNORMAL HIGH (ref 8–23)
CO2: 19 mmol/L — ABNORMAL LOW (ref 22–32)
Calcium: 7.9 mg/dL — ABNORMAL LOW (ref 8.9–10.3)
Chloride: 104 mmol/L (ref 98–111)
Creatinine, Ser: 3.01 mg/dL — ABNORMAL HIGH (ref 0.61–1.24)
GFR, Estimated: 22 mL/min — ABNORMAL LOW (ref >60)
Glucose, Bld: 99 mg/dL (ref 70–99)
Potassium: 5 mmol/L (ref 3.5–5.1)
Sodium: 131 mmol/L — ABNORMAL LOW (ref 135–145)

## 2020-01-29 LAB — GLUCOSE, CAPILLARY
Glucose-Capillary: 91 mg/dL (ref 70–99)
Glucose-Capillary: 90 mg/dL (ref 70–99)
Glucose-Capillary: 136 mg/dL — ABNORMAL HIGH (ref 70–99)
Glucose-Capillary: 160 mg/dL — ABNORMAL HIGH (ref 70–99)
Glucose-Capillary: 115 mg/dL — ABNORMAL HIGH (ref 70–99)
Glucose-Capillary: 92 mg/dL (ref 70–99)
Glucose-Capillary: 140 mg/dL — ABNORMAL HIGH (ref 70–99)

## 2020-01-29 LAB — MAGNESIUM: Magnesium: 1.6 mg/dL — ABNORMAL LOW (ref 1.7–2.4)

## 2020-01-29 LAB — PHOSPHORUS: Phosphorus: 4.6 mg/dL (ref 2.5–4.6)

## 2020-01-29 SURGERY — LOWER EXTREMITY ANGIOGRAPHY
Anesthesia: Moderate Sedation | Laterality: Right

## 2020-01-29 MED ORDER — MAGNESIUM SULFATE 2 GM/50ML IV SOLN
2.0000 g | Freq: Once | INTRAVENOUS | Status: AC
  Administered 2020-01-29: 2 g via INTRAVENOUS
  Filled 2020-01-29: qty 50

## 2020-01-29 MED ORDER — CLOPIDOGREL BISULFATE 75 MG PO TABS: 75.0000 mg | ORAL_TABLET | Freq: Every day | ORAL | Status: DC

## 2020-01-29 MED ORDER — VANCOMYCIN HCL 1750 MG/350ML IV SOLN: 1750.0000 mg | INTRAVENOUS | Status: DC

## 2020-01-29 MED ORDER — MIDAZOLAM HCL 5 MG/5ML IJ SOLN
INTRAMUSCULAR | Status: AC
  Filled 2020-01-29: qty 5

## 2020-01-29 MED ORDER — GABAPENTIN 100 MG PO CAPS
100.0000 mg | ORAL_CAPSULE | Freq: Every day | ORAL | Status: DC
  Administered 2020-01-29: 100 mg via ORAL
  Filled 2020-01-29: qty 1

## 2020-01-29 MED ORDER — VANCOMYCIN HCL 1500 MG/300ML IV SOLN
1500.0000 mg | INTRAVENOUS | Status: DC
  Filled 2020-01-29: qty 300

## 2020-01-29 MED ORDER — FENTANYL CITRATE (PF) 100 MCG/2ML IJ SOLN
INTRAMUSCULAR | Status: DC | PRN
  Administered 2020-01-29: 50 ug via INTRAVENOUS

## 2020-01-29 MED ORDER — ATORVASTATIN CALCIUM 10 MG PO TABS: 10.0000 mg | ORAL_TABLET | Freq: Every day | ORAL | Status: DC

## 2020-01-29 MED ORDER — MIDAZOLAM HCL 2 MG/2ML IJ SOLN
INTRAMUSCULAR | Status: DC | PRN
  Administered 2020-01-29: 2 mg via INTRAVENOUS

## 2020-01-29 MED ORDER — PIPERACILLIN-TAZOBACTAM 3.375 G IVPB: 3.3750 g | Freq: Three times a day (TID) | INTRAVENOUS | Status: DC

## 2020-01-29 MED ORDER — ASPIRIN EC 81 MG PO TBEC: 81.0000 mg | DELAYED_RELEASE_TABLET | Freq: Every day | ORAL | Status: DC

## 2020-01-29 MED ORDER — VANCOMYCIN HCL 750 MG/150ML IV SOLN: 750.0000 mg | INTRAVENOUS | Status: DC

## 2020-01-29 MED ORDER — FENTANYL CITRATE (PF) 100 MCG/2ML IJ SOLN
INTRAMUSCULAR | Status: AC
  Filled 2020-01-29: qty 2

## 2020-01-29 MED ORDER — SILVER SULFADIAZINE 1 % EX CREA: TOPICAL_CREAM | Freq: Every day | CUTANEOUS | 0 refills | Status: DC

## 2020-01-29 MED ORDER — IODIXANOL 320 MG/ML IV SOLN
INTRAVENOUS | Status: DC | PRN
  Administered 2020-01-29: 50 mL via INTRA_ARTERIAL

## 2020-01-29 MED ORDER — HEPARIN SODIUM (PORCINE) 1000 UNIT/ML IJ SOLN
INTRAMUSCULAR | Status: DC | PRN
  Administered 2020-01-29: 5000 [IU] via INTRAVENOUS

## 2020-01-29 MED ORDER — HEPARIN SODIUM (PORCINE) 1000 UNIT/ML IJ SOLN
INTRAMUSCULAR | Status: AC
  Filled 2020-01-29: qty 1

## 2020-01-29 SURGICAL SUPPLY — 20 items
BALLN LUTONIX  018 4X60X130 (BALLOONS) ×2
BALLN LUTONIX 018 4X150X130 (BALLOONS) ×3
BALLN LUTONIX 018 4X60X130 (BALLOONS) ×1
BALLN ULTRVRSE 3X220X150 (BALLOONS) ×3
BALLOON LUTONIX 018 4X150X130 (BALLOONS) IMPLANT
BALLOON LUTONIX 018 4X60X130 (BALLOONS) IMPLANT
BALLOON ULTRVRSE 3X220X150 (BALLOONS) IMPLANT
CATH ANGIO 5F PIGTAIL 65CM (CATHETERS) ×2 IMPLANT
CATH TEMPO 5F RIM 65CM (CATHETERS) ×2 IMPLANT
CATH VERT 5FR 125CM (CATHETERS) ×2 IMPLANT
DEVICE STARCLOSE SE CLOSURE (Vascular Products) ×2 IMPLANT
KIT ENCORE 26 ADVANTAGE (KITS) ×2 IMPLANT
PACK ANGIOGRAPHY (CUSTOM PROCEDURE TRAY) ×3 IMPLANT
SHEATH BRITE TIP 5FRX11 (SHEATH) ×2 IMPLANT
SHEATH RAABE 6FRX70 (SHEATH) ×2 IMPLANT
SYR MEDRAD MARK 7 150ML (SYRINGE) ×2 IMPLANT
TUBING CONTRAST HIGH PRESS 72 (TUBING) ×2 IMPLANT
WIRE G V18X300CM (WIRE) ×2 IMPLANT
WIRE GUIDERIGHT .035X150 (WIRE) ×3 IMPLANT
WIRE MAGIC TORQUE 260C (WIRE) ×2 IMPLANT

## 2020-01-29 NOTE — Progress Notes (Signed)
PHARMACY - PHYSICIAN COMMUNICATION CRITICAL VALUE ALERT - BLOOD CULTURE IDENTIFICATION (BCID)  Shawn Hayes is an 64 y.o. male who presented to [facility] on 01/28/2020 with a chief complaint of foot pain (B/I foot infection)  Assessment: 1/4 bottles (anaerobic) positive for staph epidermidis, suspect contaminant but current antibiotics will cover  Name of physician (or Provider) Contacted: Dr. Manuella Ghazi  Current antibiotics: Vancomycin and Zosyn  Changes to prescribed antibiotics recommended:  Recommendations accepted by provider- continue current therapy - ID to see 12/2  Results for orders placed or performed during the hospital encounter of 01/28/20  Blood Culture ID Panel (Reflexed) (Collected: 01/28/2020  2:03 PM)  Result Value Ref Range   Enterococcus faecalis NOT DETECTED NOT DETECTED   Enterococcus Faecium NOT DETECTED NOT DETECTED   Listeria monocytogenes NOT DETECTED NOT DETECTED   Staphylococcus species DETECTED (A) NOT DETECTED   Staphylococcus aureus (BCID) NOT DETECTED NOT DETECTED   Staphylococcus epidermidis DETECTED (A) NOT DETECTED   Staphylococcus lugdunensis NOT DETECTED NOT DETECTED   Streptococcus species NOT DETECTED NOT DETECTED   Streptococcus agalactiae NOT DETECTED NOT DETECTED   Streptococcus pneumoniae NOT DETECTED NOT DETECTED   Streptococcus pyogenes NOT DETECTED NOT DETECTED   A.calcoaceticus-baumannii NOT DETECTED NOT DETECTED   Bacteroides fragilis NOT DETECTED NOT DETECTED   Enterobacterales NOT DETECTED NOT DETECTED   Enterobacter cloacae complex NOT DETECTED NOT DETECTED   Escherichia coli NOT DETECTED NOT DETECTED   Klebsiella aerogenes NOT DETECTED NOT DETECTED   Klebsiella oxytoca NOT DETECTED NOT DETECTED   Klebsiella pneumoniae NOT DETECTED NOT DETECTED   Proteus species NOT DETECTED NOT DETECTED   Salmonella species NOT DETECTED NOT DETECTED   Serratia marcescens NOT DETECTED NOT DETECTED   Haemophilus influenzae NOT DETECTED NOT  DETECTED   Neisseria meningitidis NOT DETECTED NOT DETECTED   Pseudomonas aeruginosa NOT DETECTED NOT DETECTED   Stenotrophomonas maltophilia NOT DETECTED NOT DETECTED   Candida albicans NOT DETECTED NOT DETECTED   Candida auris NOT DETECTED NOT DETECTED   Candida glabrata NOT DETECTED NOT DETECTED   Candida krusei NOT DETECTED NOT DETECTED   Candida parapsilosis NOT DETECTED NOT DETECTED   Candida tropicalis NOT DETECTED NOT DETECTED   Cryptococcus neoformans/gattii NOT DETECTED NOT DETECTED   Methicillin resistance mecA/C NOT DETECTED NOT [place], PharmD Pharmacy Resident  01/29/2020 3:36 PM
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
## 2020-01-29 NOTE — Interval H&P Note (Signed)
History and Physical Interval Note:  01/29/2020 2:18 PM  Shawn Hayes  has presented today for surgery, with the diagnosis of Atherosclerotic Disease With Ulceration.  The various methods of treatment have been discussed with the patient and family. After consideration of risks, benefits and other options for treatment, the patient has consented to  Procedure(s): Lower Extremity Angiography (Right) as a surgical intervention.  The patient's history has been reviewed, patient examined, no change in status, stable for surgery.  I have reviewed the patient's chart and labs.  Questions were answered to the patient's satisfaction.     Leotis Pain

## 2020-01-29 NOTE — Progress Notes (Signed)
Patient transferred to his room , no c/o's upon transfer. Vitals stable.

## 2020-01-29 NOTE — Progress Notes (Signed)
This nurse receives call from [facility] stating they will arrive in approximately  30 minutes to transport patient to [facility]. Patient aware of transfer. Patient medicated for pain to BLE at this time.All personal belongings packed and IV access X 2  removed. Will continue to monitor to ensure comfort and safety.
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
## 2020-01-29 NOTE — Progress Notes (Signed)
OT Cancellation Note  Patient Details Name: Shawn Hayes MRN: [id number] DOB: December 05, 1955   Cancelled Treatment:    Reason Eval/Treat Not Completed: Patient at procedure or test/ unavailable. Order received, chart reviewed. Pt out of room for revascularization procedure. Will re-attempt OT evaluation at later date/time as pt is available and medically appropriate.  Jeni Salles, MPH, MS, OTR/L ascom [phone] 01/29/20, 2:29 PM

## 2020-01-29 NOTE — Plan of Care (Signed)
  Problem: Health Behavior/Discharge Planning: Goal: Ability to manage health-related needs will improve Outcome: Progressing   
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
## 2020-01-29 NOTE — Progress Notes (Signed)
PT Cancellation Note  Patient Details Name: Shawn Hayes MRN: [id number] DOB: [birth year]   Cancelled Treatment:    Reason Eval/Treat Not Completed: Patient at procedure or test/unavailable Pt out of room for revascularization procedure, PT unable to evaluate this date.   Kreg Shropshire, DPT 01/29/2020, 2:13 PM

## 2020-01-29 NOTE — Progress Notes (Signed)
Report given to Titus Dubin, RN a [facility]. Patient will be transported by [facility] transport. Awaiting call with time frame.
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
## 2020-01-29 NOTE — Consult Note (Signed)
Pharmacy Antibiotic Note  Shawn Hayes is a 64 y.o. male admitted on 01/28/2020 with cellulitis.  Pharmacy has been consulted for Vancomycin dosing. Scr (mg/dL) 3.15> 3.01   Plan:  Given renal function and patient is concomitantly taking Zosyn and Vancomycin, will adjust Vancomycin dose to 1500 mg every 36 hours (expected Cssmin 10.5 mcg/mL). Will check Scr daily and adjust Vancomycin accordingly.   Goal trough 10-15 mcg/mL.  Height: 6\' 1"  (185.4 cm) Weight: 96.2 kg (212 lb) IBW/kg (Calculated) : 79.9  Temp (24hrs), Avg:98 F (36.7 C), Min:98 F (36.7 C), Max:98 F (36.7 C)  Last Labs       Recent Labs  Lab 01/28/20 1335 01/28/20 1358  WBC 10.5  --   CREATININE 3.15*  --   LATICACIDVEN  --  1.8      Estimated Creatinine Clearance: 28.6 mL/min (A) (by C-G formula based on SCr of 3.15 mg/dL (H)).    No Known Allergies  Antimicrobials this admission: Vancomycin (11/30) >> Zosyn (11/30)>>   Microbiology results: 11/30 BCx: NGTD  Thank you for allowing pharmacy to be a part of this patient's care.  Kristeen Miss, PharmD Clinical Pharmacist
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
## 2020-01-30 ENCOUNTER — Encounter: Payer: Self-pay | Admitting: Vascular Surgery

## 2020-01-30 DIAGNOSIS — T300 Burn of unspecified body region, unspecified degree: Secondary | ICD-10-CM | POA: Diagnosis not present

## 2020-01-30 DIAGNOSIS — I340 Nonrheumatic mitral (valve) insufficiency: Secondary | ICD-10-CM | POA: Diagnosis not present

## 2020-01-30 DIAGNOSIS — R918 Other nonspecific abnormal finding of lung field: Secondary | ICD-10-CM | POA: Diagnosis not present

## 2020-01-30 DIAGNOSIS — I517 Cardiomegaly: Secondary | ICD-10-CM | POA: Diagnosis not present

## 2020-01-30 DIAGNOSIS — J811 Chronic pulmonary edema: Secondary | ICD-10-CM | POA: Diagnosis not present

## 2020-01-31 DIAGNOSIS — Z01810 Encounter for preprocedural cardiovascular examination: Secondary | ICD-10-CM | POA: Diagnosis not present

## 2020-01-31 LAB — CULTURE, BLOOD (ROUTINE X 2)

## 2020-02-02 LAB — CULTURE, BLOOD (ROUTINE X 2): Culture: NO GROWTH

## 2020-02-03 DIAGNOSIS — J811 Chronic pulmonary edema: Secondary | ICD-10-CM | POA: Diagnosis not present

## 2020-02-03 DIAGNOSIS — I509 Heart failure, unspecified: Secondary | ICD-10-CM | POA: Diagnosis not present

## 2020-02-19 DIAGNOSIS — R531 Weakness: Secondary | ICD-10-CM | POA: Diagnosis not present

## 2020-02-27 DIAGNOSIS — N183 Chronic kidney disease, stage 3 unspecified: Secondary | ICD-10-CM | POA: Diagnosis not present

## 2020-02-27 DIAGNOSIS — Z89422 Acquired absence of other left toe(s): Secondary | ICD-10-CM | POA: Diagnosis not present

## 2020-02-27 DIAGNOSIS — T8753 Necrosis of amputation stump, right lower extremity: Secondary | ICD-10-CM | POA: Diagnosis not present

## 2020-02-27 DIAGNOSIS — E11621 Type 2 diabetes mellitus with foot ulcer: Secondary | ICD-10-CM | POA: Diagnosis not present

## 2020-02-27 DIAGNOSIS — Z20822 Contact with and (suspected) exposure to covid-19: Secondary | ICD-10-CM | POA: Diagnosis not present

## 2020-02-27 DIAGNOSIS — T25331A Burn of third degree of right toe(s) (nail), initial encounter: Secondary | ICD-10-CM | POA: Diagnosis not present

## 2020-02-27 DIAGNOSIS — I5022 Chronic systolic (congestive) heart failure: Secondary | ICD-10-CM | POA: Diagnosis not present

## 2020-02-27 DIAGNOSIS — E118 Type 2 diabetes mellitus with unspecified complications: Secondary | ICD-10-CM | POA: Diagnosis not present

## 2020-02-27 DIAGNOSIS — T25321A Burn of third degree of right foot, initial encounter: Secondary | ICD-10-CM | POA: Diagnosis not present

## 2020-02-27 DIAGNOSIS — Z89432 Acquired absence of left foot: Secondary | ICD-10-CM | POA: Diagnosis not present

## 2020-02-27 DIAGNOSIS — G8918 Other acute postprocedural pain: Secondary | ICD-10-CM | POA: Diagnosis not present

## 2020-02-27 DIAGNOSIS — N189 Chronic kidney disease, unspecified: Secondary | ICD-10-CM | POA: Diagnosis not present

## 2020-02-27 DIAGNOSIS — E119 Type 2 diabetes mellitus without complications: Secondary | ICD-10-CM | POA: Diagnosis not present

## 2020-02-27 DIAGNOSIS — D649 Anemia, unspecified: Secondary | ICD-10-CM | POA: Diagnosis not present

## 2020-02-27 DIAGNOSIS — I130 Hypertensive heart and chronic kidney disease with heart failure and stage 1 through stage 4 chronic kidney disease, or unspecified chronic kidney disease: Secondary | ICD-10-CM | POA: Diagnosis not present

## 2020-02-27 DIAGNOSIS — T310 Burns involving less than 10% of body surface: Secondary | ICD-10-CM | POA: Diagnosis not present

## 2020-02-27 DIAGNOSIS — E1165 Type 2 diabetes mellitus with hyperglycemia: Secondary | ICD-10-CM | POA: Diagnosis not present

## 2020-02-27 DIAGNOSIS — E871 Hypo-osmolality and hyponatremia: Secondary | ICD-10-CM | POA: Diagnosis not present

## 2020-02-27 DIAGNOSIS — M79604 Pain in right leg: Secondary | ICD-10-CM | POA: Diagnosis not present

## 2020-02-27 DIAGNOSIS — T25322A Burn of third degree of left foot, initial encounter: Secondary | ICD-10-CM | POA: Diagnosis not present

## 2020-02-27 DIAGNOSIS — T8781 Dehiscence of amputation stump: Secondary | ICD-10-CM | POA: Diagnosis not present

## 2020-02-27 DIAGNOSIS — T23391A Burn of third degree of multiple sites of right wrist and hand, initial encounter: Secondary | ICD-10-CM | POA: Diagnosis not present

## 2020-02-27 DIAGNOSIS — I129 Hypertensive chronic kidney disease with stage 1 through stage 4 chronic kidney disease, or unspecified chronic kidney disease: Secondary | ICD-10-CM | POA: Diagnosis not present

## 2020-02-27 DIAGNOSIS — L97529 Non-pressure chronic ulcer of other part of left foot with unspecified severity: Secondary | ICD-10-CM | POA: Diagnosis not present

## 2020-02-27 DIAGNOSIS — R638 Other symptoms and signs concerning food and fluid intake: Secondary | ICD-10-CM | POA: Diagnosis not present

## 2020-02-27 DIAGNOSIS — Z7984 Long term (current) use of oral hypoglycemic drugs: Secondary | ICD-10-CM | POA: Diagnosis not present

## 2020-02-27 DIAGNOSIS — M79671 Pain in right foot: Secondary | ICD-10-CM | POA: Diagnosis not present

## 2020-02-27 DIAGNOSIS — L97519 Non-pressure chronic ulcer of other part of right foot with unspecified severity: Secondary | ICD-10-CM | POA: Diagnosis not present

## 2020-02-27 DIAGNOSIS — E1122 Type 2 diabetes mellitus with diabetic chronic kidney disease: Secondary | ICD-10-CM | POA: Diagnosis not present

## 2020-02-27 DIAGNOSIS — L03115 Cellulitis of right lower limb: Secondary | ICD-10-CM | POA: Diagnosis not present

## 2020-02-27 DIAGNOSIS — T25332A Burn of third degree of left toe(s) (nail), initial encounter: Secondary | ICD-10-CM | POA: Diagnosis not present

## 2020-02-27 DIAGNOSIS — Z89431 Acquired absence of right foot: Secondary | ICD-10-CM | POA: Diagnosis not present

## 2020-03-03 DIAGNOSIS — T25322A Burn of third degree of left foot, initial encounter: Secondary | ICD-10-CM | POA: Diagnosis not present

## 2020-03-03 DIAGNOSIS — T25321A Burn of third degree of right foot, initial encounter: Secondary | ICD-10-CM | POA: Diagnosis not present

## 2020-03-05 DIAGNOSIS — G8918 Other acute postprocedural pain: Secondary | ICD-10-CM | POA: Diagnosis not present

## 2020-03-05 DIAGNOSIS — M79671 Pain in right foot: Secondary | ICD-10-CM | POA: Diagnosis not present

## 2020-03-06 DIAGNOSIS — G8918 Other acute postprocedural pain: Secondary | ICD-10-CM | POA: Diagnosis not present

## 2020-03-06 DIAGNOSIS — M79604 Pain in right leg: Secondary | ICD-10-CM | POA: Diagnosis not present

## 2020-03-07 DIAGNOSIS — G8918 Other acute postprocedural pain: Secondary | ICD-10-CM | POA: Diagnosis not present

## 2020-03-07 DIAGNOSIS — M79671 Pain in right foot: Secondary | ICD-10-CM | POA: Diagnosis not present

## 2020-03-08 DIAGNOSIS — M79671 Pain in right foot: Secondary | ICD-10-CM | POA: Diagnosis not present

## 2020-03-08 DIAGNOSIS — G8918 Other acute postprocedural pain: Secondary | ICD-10-CM | POA: Diagnosis not present

## 2020-03-10 ENCOUNTER — Ambulatory Visit: Payer: Medicare HMO | Admitting: Family

## 2020-03-13 DIAGNOSIS — Z4781 Encounter for orthopedic aftercare following surgical amputation: Secondary | ICD-10-CM | POA: Diagnosis not present

## 2020-03-13 DIAGNOSIS — R140 Abdominal distension (gaseous): Secondary | ICD-10-CM | POA: Diagnosis not present

## 2020-03-13 DIAGNOSIS — E11621 Type 2 diabetes mellitus with foot ulcer: Secondary | ICD-10-CM | POA: Diagnosis not present

## 2020-03-13 DIAGNOSIS — N179 Acute kidney failure, unspecified: Secondary | ICD-10-CM | POA: Diagnosis not present

## 2020-03-13 DIAGNOSIS — Z89422 Acquired absence of other left toe(s): Secondary | ICD-10-CM | POA: Diagnosis not present

## 2020-03-13 DIAGNOSIS — I130 Hypertensive heart and chronic kidney disease with heart failure and stage 1 through stage 4 chronic kidney disease, or unspecified chronic kidney disease: Secondary | ICD-10-CM | POA: Diagnosis not present

## 2020-03-13 DIAGNOSIS — I10 Essential (primary) hypertension: Secondary | ICD-10-CM | POA: Diagnosis not present

## 2020-03-13 DIAGNOSIS — T8781 Dehiscence of amputation stump: Secondary | ICD-10-CM | POA: Diagnosis not present

## 2020-03-13 DIAGNOSIS — K5900 Constipation, unspecified: Secondary | ICD-10-CM | POA: Diagnosis not present

## 2020-03-13 DIAGNOSIS — T25322A Burn of third degree of left foot, initial encounter: Secondary | ICD-10-CM | POA: Diagnosis not present

## 2020-03-13 DIAGNOSIS — L97529 Non-pressure chronic ulcer of other part of left foot with unspecified severity: Secondary | ICD-10-CM | POA: Diagnosis not present

## 2020-03-13 DIAGNOSIS — L97528 Non-pressure chronic ulcer of other part of left foot with other specified severity: Secondary | ICD-10-CM | POA: Diagnosis not present

## 2020-03-13 DIAGNOSIS — I5022 Chronic systolic (congestive) heart failure: Secondary | ICD-10-CM | POA: Diagnosis not present

## 2020-03-13 DIAGNOSIS — R5381 Other malaise: Secondary | ICD-10-CM | POA: Diagnosis not present

## 2020-03-13 DIAGNOSIS — Z89412 Acquired absence of left great toe: Secondary | ICD-10-CM | POA: Diagnosis not present

## 2020-03-13 DIAGNOSIS — M6281 Muscle weakness (generalized): Secondary | ICD-10-CM | POA: Diagnosis not present

## 2020-03-13 DIAGNOSIS — E118 Type 2 diabetes mellitus with unspecified complications: Secondary | ICD-10-CM | POA: Diagnosis not present

## 2020-03-13 DIAGNOSIS — N189 Chronic kidney disease, unspecified: Secondary | ICD-10-CM | POA: Diagnosis not present

## 2020-03-13 DIAGNOSIS — T25321A Burn of third degree of right foot, initial encounter: Secondary | ICD-10-CM | POA: Diagnosis not present

## 2020-03-13 DIAGNOSIS — E1122 Type 2 diabetes mellitus with diabetic chronic kidney disease: Secondary | ICD-10-CM | POA: Diagnosis not present

## 2020-03-13 DIAGNOSIS — T25321D Burn of third degree of right foot, subsequent encounter: Secondary | ICD-10-CM | POA: Diagnosis not present

## 2020-03-13 DIAGNOSIS — T25321S Burn of third degree of right foot, sequela: Secondary | ICD-10-CM | POA: Diagnosis not present

## 2020-03-13 DIAGNOSIS — N184 Chronic kidney disease, stage 4 (severe): Secondary | ICD-10-CM | POA: Diagnosis not present

## 2020-03-13 DIAGNOSIS — N1832 Chronic kidney disease, stage 3b: Secondary | ICD-10-CM | POA: Diagnosis not present

## 2020-03-13 DIAGNOSIS — R638 Other symptoms and signs concerning food and fluid intake: Secondary | ICD-10-CM | POA: Diagnosis not present

## 2020-03-13 DIAGNOSIS — Z89432 Acquired absence of left foot: Secondary | ICD-10-CM | POA: Diagnosis not present

## 2020-03-13 DIAGNOSIS — Z89431 Acquired absence of right foot: Secondary | ICD-10-CM | POA: Diagnosis not present

## 2020-03-13 DIAGNOSIS — A499 Bacterial infection, unspecified: Secondary | ICD-10-CM | POA: Diagnosis not present

## 2020-03-13 DIAGNOSIS — Z89511 Acquired absence of right leg below knee: Secondary | ICD-10-CM | POA: Diagnosis not present

## 2020-03-13 DIAGNOSIS — T310 Burns involving less than 10% of body surface: Secondary | ICD-10-CM | POA: Diagnosis not present

## 2020-03-13 DIAGNOSIS — E43 Unspecified severe protein-calorie malnutrition: Secondary | ICD-10-CM | POA: Diagnosis not present

## 2020-03-13 DIAGNOSIS — Z7984 Long term (current) use of oral hypoglycemic drugs: Secondary | ICD-10-CM | POA: Diagnosis not present

## 2020-03-13 DIAGNOSIS — T25322D Burn of third degree of left foot, subsequent encounter: Secondary | ICD-10-CM | POA: Diagnosis not present

## 2020-03-13 DIAGNOSIS — D631 Anemia in chronic kidney disease: Secondary | ICD-10-CM | POA: Diagnosis not present

## 2020-03-13 DIAGNOSIS — T25322S Burn of third degree of left foot, sequela: Secondary | ICD-10-CM | POA: Diagnosis not present

## 2020-03-13 DIAGNOSIS — I959 Hypotension, unspecified: Secondary | ICD-10-CM | POA: Diagnosis not present

## 2020-03-13 DIAGNOSIS — R279 Unspecified lack of coordination: Secondary | ICD-10-CM | POA: Diagnosis not present

## 2020-03-13 DIAGNOSIS — T8753 Necrosis of amputation stump, right lower extremity: Secondary | ICD-10-CM | POA: Diagnosis not present

## 2020-03-13 DIAGNOSIS — I428 Other cardiomyopathies: Secondary | ICD-10-CM | POA: Diagnosis not present

## 2020-03-27 DIAGNOSIS — R279 Unspecified lack of coordination: Secondary | ICD-10-CM | POA: Diagnosis not present

## 2020-03-27 DIAGNOSIS — N179 Acute kidney failure, unspecified: Secondary | ICD-10-CM | POA: Diagnosis not present

## 2020-03-27 DIAGNOSIS — N183 Chronic kidney disease, stage 3 unspecified: Secondary | ICD-10-CM | POA: Diagnosis not present

## 2020-03-27 DIAGNOSIS — T25322D Burn of third degree of left foot, subsequent encounter: Secondary | ICD-10-CM | POA: Diagnosis not present

## 2020-03-27 DIAGNOSIS — I10 Essential (primary) hypertension: Secondary | ICD-10-CM | POA: Diagnosis not present

## 2020-03-27 DIAGNOSIS — T8781 Dehiscence of amputation stump: Secondary | ICD-10-CM | POA: Diagnosis not present

## 2020-03-27 DIAGNOSIS — Z89431 Acquired absence of right foot: Secondary | ICD-10-CM | POA: Diagnosis not present

## 2020-03-27 DIAGNOSIS — I5020 Unspecified systolic (congestive) heart failure: Secondary | ICD-10-CM | POA: Diagnosis not present

## 2020-03-27 DIAGNOSIS — Z89412 Acquired absence of left great toe: Secondary | ICD-10-CM | POA: Diagnosis not present

## 2020-03-27 DIAGNOSIS — A499 Bacterial infection, unspecified: Secondary | ICD-10-CM | POA: Diagnosis not present

## 2020-03-27 DIAGNOSIS — N184 Chronic kidney disease, stage 4 (severe): Secondary | ICD-10-CM | POA: Diagnosis not present

## 2020-03-27 DIAGNOSIS — Z872 Personal history of diseases of the skin and subcutaneous tissue: Secondary | ICD-10-CM | POA: Diagnosis not present

## 2020-03-27 DIAGNOSIS — T8753 Necrosis of amputation stump, right lower extremity: Secondary | ICD-10-CM | POA: Diagnosis not present

## 2020-03-27 DIAGNOSIS — L97422 Non-pressure chronic ulcer of left heel and midfoot with fat layer exposed: Secondary | ICD-10-CM | POA: Diagnosis not present

## 2020-03-27 DIAGNOSIS — T310 Burns involving less than 10% of body surface: Secondary | ICD-10-CM | POA: Diagnosis not present

## 2020-03-27 DIAGNOSIS — B3324 Viral cardiomyopathy: Secondary | ICD-10-CM | POA: Diagnosis not present

## 2020-03-27 DIAGNOSIS — M6281 Muscle weakness (generalized): Secondary | ICD-10-CM | POA: Diagnosis not present

## 2020-03-27 DIAGNOSIS — Z89432 Acquired absence of left foot: Secondary | ICD-10-CM | POA: Diagnosis not present

## 2020-03-27 DIAGNOSIS — I428 Other cardiomyopathies: Secondary | ICD-10-CM | POA: Diagnosis not present

## 2020-03-27 DIAGNOSIS — L97528 Non-pressure chronic ulcer of other part of left foot with other specified severity: Secondary | ICD-10-CM | POA: Diagnosis not present

## 2020-03-27 DIAGNOSIS — E11621 Type 2 diabetes mellitus with foot ulcer: Secondary | ICD-10-CM | POA: Diagnosis not present

## 2020-03-27 DIAGNOSIS — E1110 Type 2 diabetes mellitus with ketoacidosis without coma: Secondary | ICD-10-CM | POA: Diagnosis not present

## 2020-03-27 DIAGNOSIS — R5381 Other malaise: Secondary | ICD-10-CM | POA: Diagnosis not present

## 2020-03-27 DIAGNOSIS — N189 Chronic kidney disease, unspecified: Secondary | ICD-10-CM | POA: Diagnosis not present

## 2020-03-27 DIAGNOSIS — G629 Polyneuropathy, unspecified: Secondary | ICD-10-CM | POA: Diagnosis not present

## 2020-03-27 DIAGNOSIS — E875 Hyperkalemia: Secondary | ICD-10-CM | POA: Diagnosis not present

## 2020-03-27 DIAGNOSIS — Z4781 Encounter for orthopedic aftercare following surgical amputation: Secondary | ICD-10-CM | POA: Diagnosis not present

## 2020-03-27 DIAGNOSIS — E1122 Type 2 diabetes mellitus with diabetic chronic kidney disease: Secondary | ICD-10-CM | POA: Diagnosis not present

## 2020-03-27 DIAGNOSIS — T25321S Burn of third degree of right foot, sequela: Secondary | ICD-10-CM | POA: Diagnosis not present

## 2020-03-27 DIAGNOSIS — L97529 Non-pressure chronic ulcer of other part of left foot with unspecified severity: Secondary | ICD-10-CM | POA: Diagnosis not present

## 2020-03-27 DIAGNOSIS — H60542 Acute eczematoid otitis externa, left ear: Secondary | ICD-10-CM | POA: Diagnosis not present

## 2020-03-27 DIAGNOSIS — Z89422 Acquired absence of other left toe(s): Secondary | ICD-10-CM | POA: Diagnosis not present

## 2020-03-27 DIAGNOSIS — T25321D Burn of third degree of right foot, subsequent encounter: Secondary | ICD-10-CM | POA: Diagnosis not present

## 2020-03-27 DIAGNOSIS — Z89511 Acquired absence of right leg below knee: Secondary | ICD-10-CM | POA: Diagnosis not present

## 2020-03-27 DIAGNOSIS — N1832 Chronic kidney disease, stage 3b: Secondary | ICD-10-CM | POA: Diagnosis not present

## 2020-03-27 DIAGNOSIS — N400 Enlarged prostate without lower urinary tract symptoms: Secondary | ICD-10-CM | POA: Diagnosis not present

## 2020-03-27 DIAGNOSIS — E0822 Diabetes mellitus due to underlying condition with diabetic chronic kidney disease: Secondary | ICD-10-CM | POA: Diagnosis not present

## 2020-04-02 DIAGNOSIS — E1110 Type 2 diabetes mellitus with ketoacidosis without coma: Secondary | ICD-10-CM | POA: Diagnosis not present

## 2020-04-07 DIAGNOSIS — G629 Polyneuropathy, unspecified: Secondary | ICD-10-CM | POA: Diagnosis not present

## 2020-04-07 DIAGNOSIS — E0822 Diabetes mellitus due to underlying condition with diabetic chronic kidney disease: Secondary | ICD-10-CM | POA: Diagnosis not present

## 2020-04-07 DIAGNOSIS — N179 Acute kidney failure, unspecified: Secondary | ICD-10-CM | POA: Diagnosis not present

## 2020-04-07 DIAGNOSIS — N183 Chronic kidney disease, stage 3 unspecified: Secondary | ICD-10-CM | POA: Diagnosis not present

## 2020-04-07 DIAGNOSIS — E875 Hyperkalemia: Secondary | ICD-10-CM | POA: Diagnosis not present

## 2020-04-07 DIAGNOSIS — N400 Enlarged prostate without lower urinary tract symptoms: Secondary | ICD-10-CM | POA: Diagnosis not present

## 2020-04-07 DIAGNOSIS — Z89511 Acquired absence of right leg below knee: Secondary | ICD-10-CM | POA: Diagnosis not present

## 2020-04-07 DIAGNOSIS — I10 Essential (primary) hypertension: Secondary | ICD-10-CM | POA: Diagnosis not present

## 2020-04-07 DIAGNOSIS — I5020 Unspecified systolic (congestive) heart failure: Secondary | ICD-10-CM | POA: Diagnosis not present

## 2020-04-09 DIAGNOSIS — H60542 Acute eczematoid otitis externa, left ear: Secondary | ICD-10-CM | POA: Diagnosis not present

## 2020-04-13 DIAGNOSIS — Z89511 Acquired absence of right leg below knee: Secondary | ICD-10-CM | POA: Diagnosis not present

## 2020-04-13 DIAGNOSIS — G629 Polyneuropathy, unspecified: Secondary | ICD-10-CM | POA: Diagnosis not present

## 2020-04-13 DIAGNOSIS — E1122 Type 2 diabetes mellitus with diabetic chronic kidney disease: Secondary | ICD-10-CM | POA: Diagnosis not present

## 2020-04-13 DIAGNOSIS — B3324 Viral cardiomyopathy: Secondary | ICD-10-CM | POA: Diagnosis not present

## 2020-04-13 DIAGNOSIS — N183 Chronic kidney disease, stage 3 unspecified: Secondary | ICD-10-CM | POA: Diagnosis not present

## 2020-04-13 DIAGNOSIS — I10 Essential (primary) hypertension: Secondary | ICD-10-CM | POA: Diagnosis not present

## 2020-04-13 DIAGNOSIS — N400 Enlarged prostate without lower urinary tract symptoms: Secondary | ICD-10-CM | POA: Diagnosis not present

## 2020-04-13 DIAGNOSIS — I5020 Unspecified systolic (congestive) heart failure: Secondary | ICD-10-CM | POA: Diagnosis not present

## 2020-04-13 DIAGNOSIS — N179 Acute kidney failure, unspecified: Secondary | ICD-10-CM | POA: Diagnosis not present

## 2020-04-17 DIAGNOSIS — Z89422 Acquired absence of other left toe(s): Secondary | ICD-10-CM | POA: Diagnosis not present

## 2020-04-17 DIAGNOSIS — I5020 Unspecified systolic (congestive) heart failure: Secondary | ICD-10-CM | POA: Diagnosis not present

## 2020-04-17 DIAGNOSIS — Z4781 Encounter for orthopedic aftercare following surgical amputation: Secondary | ICD-10-CM | POA: Diagnosis not present

## 2020-04-17 DIAGNOSIS — Z89511 Acquired absence of right leg below knee: Secondary | ICD-10-CM | POA: Diagnosis not present

## 2020-04-17 DIAGNOSIS — Z09 Encounter for follow-up examination after completed treatment for conditions other than malignant neoplasm: Secondary | ICD-10-CM | POA: Diagnosis not present

## 2020-04-17 DIAGNOSIS — N183 Chronic kidney disease, stage 3 unspecified: Secondary | ICD-10-CM | POA: Diagnosis not present

## 2020-04-17 DIAGNOSIS — I110 Hypertensive heart disease with heart failure: Secondary | ICD-10-CM | POA: Diagnosis not present

## 2020-04-17 DIAGNOSIS — J42 Unspecified chronic bronchitis: Secondary | ICD-10-CM | POA: Diagnosis not present

## 2020-04-17 DIAGNOSIS — E1122 Type 2 diabetes mellitus with diabetic chronic kidney disease: Secondary | ICD-10-CM | POA: Diagnosis not present

## 2020-04-24 ENCOUNTER — Encounter: Payer: Medicare HMO | Attending: Physician Assistant | Admitting: Physician Assistant

## 2020-04-24 ENCOUNTER — Other Ambulatory Visit: Payer: Self-pay

## 2020-04-24 DIAGNOSIS — I5042 Chronic combined systolic (congestive) and diastolic (congestive) heart failure: Secondary | ICD-10-CM | POA: Insufficient documentation

## 2020-04-24 DIAGNOSIS — I110 Hypertensive heart disease with heart failure: Secondary | ICD-10-CM | POA: Diagnosis not present

## 2020-04-24 DIAGNOSIS — L97812 Non-pressure chronic ulcer of other part of right lower leg with fat layer exposed: Secondary | ICD-10-CM | POA: Diagnosis not present

## 2020-04-24 DIAGNOSIS — L97522 Non-pressure chronic ulcer of other part of left foot with fat layer exposed: Secondary | ICD-10-CM | POA: Diagnosis not present

## 2020-04-24 DIAGNOSIS — T8131XA Disruption of external operation (surgical) wound, not elsewhere classified, initial encounter: Secondary | ICD-10-CM | POA: Diagnosis not present

## 2020-04-24 DIAGNOSIS — F17200 Nicotine dependence, unspecified, uncomplicated: Secondary | ICD-10-CM | POA: Diagnosis not present

## 2020-04-24 DIAGNOSIS — Z89432 Acquired absence of left foot: Secondary | ICD-10-CM | POA: Insufficient documentation

## 2020-04-24 DIAGNOSIS — I10 Essential (primary) hypertension: Secondary | ICD-10-CM | POA: Diagnosis not present

## 2020-04-24 DIAGNOSIS — L97529 Non-pressure chronic ulcer of other part of left foot with unspecified severity: Secondary | ICD-10-CM | POA: Diagnosis present

## 2020-04-24 DIAGNOSIS — E11621 Type 2 diabetes mellitus with foot ulcer: Secondary | ICD-10-CM | POA: Insufficient documentation

## 2020-04-24 DIAGNOSIS — Z89511 Acquired absence of right leg below knee: Secondary | ICD-10-CM | POA: Insufficient documentation

## 2020-04-24 DIAGNOSIS — J449 Chronic obstructive pulmonary disease, unspecified: Secondary | ICD-10-CM | POA: Diagnosis not present

## 2020-04-29 NOTE — Progress Notes (Signed)
Shawn Hayes, Shawn Hayes ([id number]) Visit Report for 04/24/2020 Chief Complaint Document Details Patient Name: Shawn Hayes, Shawn Hayes. Date of Service: 04/24/2020 2:00 PM Medical Record Number: [id number] Patient Account Number: [id number] Date of Birth/Sex: 10-26-1955 (65 y.o. Male) Treating RN: Carlene Coria Primary Care Provider: Frazier Richards Other Clinician: Referring Provider: Grayland Ormond Treating Provider/Extender: Skipper Cliche in Treatment: 0 Information Obtained from: Patient Chief Complaint Right BKA and left foot ulcers Electronic Signature(s) Signed: 04/24/2020 2:55:31 PM By: Worthy Keeler PA-C Entered By: Worthy Keeler on 04/24/2020 14:55:31 Shawn Hayes ([id number]) -------------------------------------------------------------------------------- Debridement Details Patient Name: Shawn Hayes Date of Service: 04/24/2020 2:00 PM Medical Record Number: [id number] Patient Account Number: [id number] Date of Birth/Sex: 05-01-1955 (65 y.o. Male) Treating RN: Dolan Amen Primary Care Provider: Frazier Richards Other Clinician: Referring Provider: Grayland Ormond Treating Provider/Extender: Skipper Cliche in Treatment: 0 Debridement Performed for Wound #1 Right Amputation Site - Below Knee Assessment: Performed By: Physician Tommie Sams., PA-C Debridement Type: Debridement Level of Consciousness (Pre- Awake and Alert procedure): Pre-procedure Verification/Time Out Yes - 15:00 Taken: Start Time: 15:00 Pain Control: Lidocaine 4% Topical Solution Total Area Debrided (L x W): 0.5 (cm) x 2 (cm) = 1 (cm) Tissue and other material Non-Viable, Eschar, Slough, Subcutaneous, Slough debrided: Level: Skin/Subcutaneous Tissue Debridement Description: Excisional Instrument: Curette Bleeding: Minimum Hemostasis Achieved: Pressure Response to Treatment: Procedure was tolerated well Level of Consciousness (Post- Awake and Alert procedure): Post  Debridement Measurements of Total Wound Length: (cm) 0.6 Width: (cm) 2.1 Depth: (cm) 0.2 Volume: (cm) 0.198 Character of Wound/Ulcer Post Debridement: Stable Post Procedure Diagnosis Same as Pre-procedure Electronic Signature(s) Signed: 04/24/2020 4:44:45 PM By: Worthy Keeler PA-C Signed: 04/24/2020 4:57:08 PM By: Georges Mouse, Minus Breeding RN Entered By: Georges Mouse, Minus Breeding on 04/24/2020 15:01:42 Shawn Hayes ([id number]) -------------------------------------------------------------------------------- Debridement Details Patient Name: Shawn Hayes. Date of Service: 04/24/2020 2:00 PM Medical Record Number: [id number] Patient Account Number: [id number] Date of Birth/Sex: 07/30/1955 (65 y.o. Male) Treating RN: Dolan Amen Primary Care Provider: Frazier Richards Other Clinician: Referring Provider: Grayland Ormond Treating Provider/Extender: Skipper Cliche in Treatment: 0 Debridement Performed for Wound #2 Left,Medial Amputation Site - Transmetatarsal Assessment: Performed By: Physician Tommie Sams., PA-C Debridement Type: Debridement Level of Consciousness (Pre- Awake and Alert procedure): Pre-procedure Verification/Time Out Yes - 15:00 Taken: Start Time: 15:00 Pain Control: Lidocaine 4% Topical Solution Total Area Debrided (L x W): 0.5 (cm) x 2.7 (cm) = 1.35 (cm) Tissue and other material Non-Viable, Slough, Subcutaneous, Slough debrided: Level: Skin/Subcutaneous Tissue Debridement Description: Excisional Instrument: Curette Bleeding: Minimum Hemostasis Achieved: Pressure Response to Treatment: Procedure was tolerated well Level of Consciousness (Post- Awake and Alert procedure): Post Debridement Measurements of Total Wound Length: (cm) 0.5 Width: (cm) 2.7 Depth: (cm) 0.5 Volume: (cm) 0.53 Character of Wound/Ulcer Post Debridement: Stable Post Procedure Diagnosis Same as Pre-procedure Electronic Signature(s) Signed: 04/24/2020 4:44:45 PM  By: Worthy Keeler PA-C Signed: 04/24/2020 4:57:08 PM By: Georges Mouse, Minus Breeding RN Entered By: Georges Mouse, Minus Breeding on 04/24/2020 15:03:34 Shawn Hayes ([id number]) -------------------------------------------------------------------------------- Debridement Details Patient Name: Shawn Hayes. Date of Service: 04/24/2020 2:00 PM Medical Record Number: [id number] Patient Account Number: [id number] Date of Birth/Sex: [birth year] (65 y.o. Male) Treating RN: Dolan Amen Primary Care Provider: Frazier Richards Other Clinician: Referring Provider: Grayland Ormond Treating Provider/Extender: Skipper Cliche in Treatment: 0 Debridement Performed for Wound #3 Left,Lateral Amputation Site - Transmetatarsal Assessment: Performed By: Physician Tommie Sams., PA-C Debridement Type: Debridement Level of Consciousness (Pre- Awake and Alert  procedure): Pre-procedure Verification/Time Out Yes - 15:00 Taken: Start Time: 15:00 Pain Control: Lidocaine 4% Topical Solution Total Area Debrided (L x W): 1 (cm) x 1.8 (cm) = 1.8 (cm) Tissue and other material Non-Viable, Slough, Subcutaneous, Slough debrided: Level: Skin/Subcutaneous Tissue Debridement Description: Excisional Instrument: Curette Bleeding: Minimum Hemostasis Achieved: Pressure Response to Treatment: Procedure was tolerated well Level of Consciousness (Post- Awake and Alert procedure): Post Debridement Measurements of Total Wound Length: (cm) 1 Width: (cm) 1.8 Depth: (cm) 0.5 Volume: (cm) 0.707 Character of Wound/Ulcer Post Debridement: Stable Post Procedure Diagnosis Same as Pre-procedure Electronic Signature(s) Signed: 04/24/2020 4:44:45 PM By: Worthy Keeler PA-C Signed: 04/24/2020 4:57:08 PM By: Georges Mouse, Minus Breeding RN Entered By: Georges Mouse, Minus Breeding on 04/24/2020 15:04:28 Shawn Hayes ([id number]) -------------------------------------------------------------------------------- HPI  Details Patient Name: Shawn Hayes. Date of Service: 04/24/2020 2:00 PM Medical Record Number: [id number] Patient Account Number: [id number] Date of Birth/Sex: 21-Apr-1955 (65 y.o. Male) Treating RN: Carlene Coria Primary Care Provider: Frazier Richards Other Clinician: Referring Provider: Grayland Ormond Treating Provider/Extender: Skipper Cliche in Treatment: 0 History of Present Illness HPI Description: 04/24/2020 upon evaluation today patient presents for initial evaluation here in our clinic concerning issues that he has been having with wound dehiscence in regard to his below-knee amputation on the right as well as transmetatarsal amputation on the left. Fortunately this does not appear to be doing too badly which is good news. There is no signs of active infection at this time. I do think he is going require little bit of sharp debridement here but fortunately overall I think he is actually managing quite nicely in a bad situation. The initial below-knee amputation was in January on the right. His transmetatarsal amputation was in November 2021. The patient states that obviously his lifestyle has changed dramatically over the past several months. He seems to be in fairly good spirits all things considered. He does have a history of diabetes mellitus type 2. Electronic Signature(s) Signed: 04/24/2020 4:40:58 PM By: Worthy Keeler PA-C Entered By: Worthy Keeler on 04/24/2020 16:40:57 Shawn Hayes ([id number]) -------------------------------------------------------------------------------- Physical Exam Details Patient Name: Shawn Hayes Date of Service: 04/24/2020 2:00 PM Medical Record Number: [id number] Patient Account Number: [id number] Date of Birth/Sex: 12-13-1955 (65 y.o. Male) Treating RN: Carlene Coria Primary Care Provider: Frazier Richards Other Clinician: Referring Provider: Grayland Ormond Treating Provider/Extender: Skipper Cliche in Treatment:  0 Constitutional patient is hypertensive.. pulse regular and within target range for patient.Marland Kitchen respirations regular, non-labored and within target range for patient.Marland Kitchen temperature within target range for patient.. Well-nourished and well-hydrated in no acute distress. Eyes conjunctiva clear no eyelid edema noted. pupils equal round and reactive to light and accommodation. Ears, Nose, Mouth, and Throat no gross abnormality of ear auricles or external auditory canals. normal hearing noted during conversation. mucus membranes moist. Respiratory normal breathing without difficulty. Cardiovascular 2+ dorsalis pedis/posterior tibialis pulses On the left leg.. no clubbing, cyanosis, significant edema, <3 sec cap refill. Musculoskeletal Patient unable to walk Secondary to amputations.. no significant deformity or arthritic changes, no loss or range of motion, no clubbing. Psychiatric this patient is able to make decisions and demonstrates good insight into disease process. Alert and Oriented x 3. pleasant and cooperative. Notes Upon inspection patient's wound bed actually showed signs of some slough and biofilm noted on both wounds of the transmetatarsal site. He also had some eschar noted over the right below-knee amputation site. Actually perform sharp debridement to all wound locations and the patient tolerated this  without complication. Post debridement the wound bed appears to be doing much better which is great news. Electronic Signature(s) Signed: 04/24/2020 4:42:21 PM By: Worthy Keeler PA-C Entered By: Worthy Keeler on 04/24/2020 16:42:21 Shawn Hayes ([id number]) -------------------------------------------------------------------------------- Physician Orders Details Patient Name: Shawn Hayes Date of Service: 04/24/2020 2:00 PM Medical Record Number: [id number] Patient Account Number: [id number] Date of Birth/Sex: 23-Aug-1955 (65 y.o. Male) Treating RN: Dolan Amen Primary Care Provider: Frazier Richards Other Clinician: Referring Provider: Grayland Ormond Treating Provider/Extender: Skipper Cliche in Treatment: 0 Verbal / Phone Orders: No Diagnosis Coding ICD-10 Coding Code Description E11.621 Type 2 diabetes mellitus with foot ulcer L97.522 Non-pressure chronic ulcer of other part of left foot with fat layer exposed Z89.511 Acquired absence of right leg below knee L97.812 Non-pressure chronic ulcer of other part of right lower leg with fat layer exposed I10 Essential (primary) hypertension I50.42 Chronic combined systolic (congestive) and diastolic (congestive) heart failure J44.9 Chronic obstructive pulmonary disease, unspecified Follow-up Appointments Wound #1 Right Amputation Site - Below Knee o Return Appointment in 1 week. Wound #2 Left,Medial Amputation Site - Transmetatarsal o Return Appointment in 1 week. Wound #3 Left,Lateral Amputation Site - Transmetatarsal o Return Appointment in 1 week. Bathing/ Shower/ Hygiene Wound #1 Right Amputation Site - Below Knee o Clean wound with Normal Saline or wound cleanser. Wound #2 Left,Medial Amputation Site - Transmetatarsal o Clean wound with Normal Saline or wound cleanser. Wound #3 Left,Lateral Amputation Site - Transmetatarsal o Clean wound with Normal Saline or wound cleanser. Wound Treatment Wound #1 - Amputation Site - Below Knee Wound Laterality: Right Cleanser: Byram Ancillary Kit - 15 Day Supply (DME) (Generic) 3 x Per Week/30 Days Discharge Instructions: Use supplies as instructed; Kit contains: (15) Saline Bullets; (15) 3x3 Gauze; 15 pr Gloves Cleanser: Normal Saline (DME) (Generic) 3 x Per Week/30 Days Discharge Instructions: Wash your hands with soap and water. Remove old dressing, discard into plastic bag and place into trash. Cleanse the wound with Normal Saline prior to applying a clean dressing using gauze sponges, not tissues or cotton balls. Do not  scrub or use excessive force. Pat dry using gauze sponges, not tissue or cotton balls. Primary Dressing: Prisma 4.34 (in) (DME) (Generic) 3 x Per Week/30 Days Discharge Instructions: Moisten w/normal saline or sterile water; Cover wound as directed. Do not remove from wound bed. Secondary Dressing: Mepilex Border Flex, 4x4 (in/in) (DME) (Generic) 3 x Per Week/30 Days Discharge Instructions: Apply to wound as directed. Do not cut. Wound #2 - Amputation Site - Transmetatarsal Wound Laterality: Left, Medial Cleanser: Byram Ancillary Kit - 15 Day Supply 3 x Per Week/30 Days Discharge Instructions: Use supplies as instructed; Kit contains: (15) Saline Bullets; (15) 3x3 Gauze; 15 pr Gloves Cleanser: Normal Saline (DME) (Generic) 3 x Per Week/30 Days Shawn Hayes, Shawn Hayes ([id number]) Discharge Instructions: Wash your hands with soap and water. Remove old dressing, discard into plastic bag and place into trash. Cleanse the wound with Normal Saline prior to applying a clean dressing using gauze sponges, not tissues or cotton balls. Do not scrub or use excessive force. Pat dry using gauze sponges, not tissue or cotton balls. Primary Dressing: Prisma 4.34 (in) (DME) (Generic) 3 x Per Week/30 Days Discharge Instructions: Moisten w/normal saline or sterile water; Cover wound as directed. Do not remove from wound bed. Secondary Dressing: Mepilex Border Flex, 4x4 (in/in) (DME) (Generic) 3 x Per Week/30 Days Discharge Instructions: Apply to wound as directed. Do not cut. Wound #3 -  Amputation Site - Transmetatarsal Wound Laterality: Left, Lateral Cleanser: Byram Ancillary Kit - 15 Day Supply 3 x Per Week/30 Days Discharge Instructions: Use supplies as instructed; Kit contains: (15) Saline Bullets; (15) 3x3 Gauze; 15 pr Gloves Cleanser: Normal Saline (DME) (Generic) 3 x Per Week/30 Days Discharge Instructions: Wash your hands with soap and water. Remove old dressing, discard into plastic bag and place into  trash. Cleanse the wound with Normal Saline prior to applying a clean dressing using gauze sponges, not tissues or cotton balls. Do not scrub or use excessive force. Pat dry using gauze sponges, not tissue or cotton balls. Primary Dressing: Prisma 4.34 (in) (DME) (Generic) 3 x Per Week/30 Days Discharge Instructions: Moisten w/normal saline or sterile water; Cover wound as directed. Do not remove from wound bed. Secondary Dressing: Mepilex Border Flex, 4x4 (in/in) (DME) (Generic) 3 x Per Week/30 Days Discharge Instructions: Apply to wound as directed. Do not cut. Electronic Signature(s) Signed: 04/24/2020 4:44:45 PM By: Worthy Keeler PA-C Signed: 04/24/2020 4:57:08 PM By: Georges Mouse, Minus Breeding RN Entered By: Georges Mouse, Minus Breeding on 04/24/2020 15:13:14 Shawn Hayes ([id number]) -------------------------------------------------------------------------------- Problem List Details Patient Name: Shawn Hayes, Shawn Hayes. Date of Service: 04/24/2020 2:00 PM Medical Record Number: [id number] Patient Account Number: [id number] Date of Birth/Sex: October 27, 1955 (65 y.o. Male) Treating RN: Carlene Coria Primary Care Provider: Frazier Richards Other Clinician: Referring Provider: Grayland Ormond Treating Provider/Extender: Skipper Cliche in Treatment: 0 Active Problems ICD-10 Encounter Code Description Active Date MDM Diagnosis E11.621 Type 2 diabetes mellitus with foot ulcer 04/24/2020 No Yes Z89.421 Acquired absence of other right toe(s) 04/24/2020 No Yes L97.522 Non-pressure chronic ulcer of other part of left foot with fat layer 04/24/2020 No Yes exposed Z89.511 Acquired absence of right leg below knee 04/24/2020 No Yes L97.812 Non-pressure chronic ulcer of other part of right lower leg with fat layer 04/24/2020 No Yes exposed I10 Essential (primary) hypertension 04/24/2020 No Yes I50.42 Chronic combined systolic (congestive) and diastolic (congestive) heart 04/24/2020 No Yes failure J44.9  Chronic obstructive pulmonary disease, unspecified 04/24/2020 No Yes Inactive Problems Resolved Problems Electronic Signature(s) Signed: 04/24/2020 4:35:55 PM By: Worthy Keeler PA-C Previous Signature: 04/24/2020 2:55:14 PM Version By: Worthy Keeler PA-C Entered By: Worthy Keeler on 04/24/2020 16:35:54 Shawn Hayes ([id number]) -------------------------------------------------------------------------------- Progress Note Details Patient Name: Shawn Hayes. Date of Service: 04/24/2020 2:00 PM Medical Record Number: [id number] Patient Account Number: [id number] Date of Birth/Sex: [birth year] (65 y.o. Male) Treating RN: Carlene Coria Primary Care Provider: Frazier Richards Other Clinician: Referring Provider: Grayland Ormond Treating Provider/Extender: Skipper Cliche in Treatment: 0 Subjective Chief Complaint Information obtained from Patient Right BKA and left foot ulcers History of Present Illness (HPI) 04/24/2020 upon evaluation today patient presents for initial evaluation here in our clinic concerning issues that he has been having with wound dehiscence in regard to his below-knee amputation on the right as well as transmetatarsal amputation on the left. Fortunately this does not appear to be doing too badly which is good news. There is no signs of active infection at this time. I do think he is going require little bit of sharp debridement here but fortunately overall I think he is actually managing quite nicely in a bad situation. The initial below-knee amputation was in January on the right. His transmetatarsal amputation was in November 2021. The patient states that obviously his lifestyle has changed dramatically over the past several months. He seems to be in fairly good spirits all things considered. He does have a  history of diabetes mellitus type 2. Patient History Information obtained from Patient. Allergies acetaminophen (Severity: Mild, Reaction: break out  in a sweat) Social History Current some day smoker - 45 years, Alcohol Use - Never, Drug Use - No History, Caffeine Use - Daily. Medical History Eyes Denies history of Cataracts, Glaucoma, Optic Neuritis Ear/Nose/Mouth/Throat Denies history of Chronic sinus problems/congestion, Middle ear problems Hematologic/Lymphatic Denies history of Anemia, Hemophilia, Human Immunodeficiency Virus, Lymphedema, Sickle Cell Disease Respiratory Denies history of Aspiration, Asthma, Chronic Obstructive Pulmonary Disease (COPD), Pneumothorax, Sleep Apnea, Tuberculosis Cardiovascular Patient has history of Congestive Heart Failure - 1 year Denies history of Angina, Arrhythmia, Coronary Artery Disease, Deep Vein Thrombosis, Hypertension, Hypotension, Myocardial Infarction, Peripheral Arterial Disease, Peripheral Venous Disease, Phlebitis, Vasculitis Gastrointestinal Denies history of Cirrhosis , Colitis, Crohn s, Hepatitis A, Hepatitis B, Hepatitis C Endocrine Patient has history of Type II Diabetes - 1 year Denies history of Type I Diabetes Genitourinary Denies history of End Stage Renal Disease Immunological Denies history of Lupus Erythematosus, Raynaud s, Scleroderma Integumentary (Skin) Denies history of History of Burn, History of pressure wounds Musculoskeletal Denies history of Gout, Rheumatoid Arthritis, Osteoarthritis, Osteomyelitis Neurologic Denies history of Dementia, Neuropathy, Quadriplegia, Paraplegia, Seizure Disorder Oncologic Denies history of Received Chemotherapy, Received Radiation Psychiatric Denies history of Anorexia/bulimia, Confinement Anxiety Patient is treated with Oral Agents. Blood sugar is tested. Review of Systems (ROS) Constitutional Symptoms (General Health) Denies complaints or symptoms of Fatigue, Fever, Chills, Marked Weight Change. Eyes Denies complaints or symptoms of Dry Eyes, Vision Changes, Glasses / Contacts. Ear/Nose/Mouth/Throat Shawn Hayes  ([id number]) Denies complaints or symptoms of Difficult clearing ears, Sinusitis. Hematologic/Lymphatic Denies complaints or symptoms of Bleeding / Clotting Disorders, Human Immunodeficiency Virus. Respiratory Denies complaints or symptoms of Chronic or frequent coughs, Shortness of Breath. Cardiovascular Denies complaints or symptoms of Chest pain, LE edema. Gastrointestinal Denies complaints or symptoms of Frequent diarrhea, Nausea, Vomiting. Endocrine Denies complaints or symptoms of Hepatitis, Thyroid disease, Polydypsia (Excessive Thirst). Genitourinary Denies complaints or symptoms of Kidney failure/ Dialysis, Incontinence/dribbling. Immunological Denies complaints or symptoms of Hives, Itching. Integumentary (Skin) Complains or has symptoms of Wounds. Denies complaints or symptoms of Bleeding or bruising tendency, Breakdown, Swelling. Musculoskeletal Denies complaints or symptoms of Muscle Pain, Muscle Weakness. Neurologic Denies complaints or symptoms of Numbness/parasthesias, Focal/Weakness. Psychiatric Denies complaints or symptoms of Anxiety, Claustrophobia. Objective Constitutional patient is hypertensive.. pulse regular and within target range for patient.Marland Kitchen respirations regular, non-labored and within target range for patient.Marland Kitchen temperature within target range for patient.. Well-nourished and well-hydrated in no acute distress. Vitals Time Taken: 2:15 PM, Temperature: 98.0 F, Pulse: 80 bpm, Respiratory Rate: 18 breaths/min, Blood Pressure: 164/88 mmHg. Eyes conjunctiva clear no eyelid edema noted. pupils equal round and reactive to light and accommodation. Ears, Nose, Mouth, and Throat no gross abnormality of ear auricles or external auditory canals. normal hearing noted during conversation. mucus membranes moist. Respiratory normal breathing without difficulty. Cardiovascular 2+ dorsalis pedis/posterior tibialis pulses On the left leg.. no clubbing, cyanosis,  significant edema, Musculoskeletal Patient unable to walk Secondary to amputations.. no significant deformity or arthritic changes, no loss or range of motion, no clubbing. Psychiatric this patient is able to make decisions and demonstrates good insight into disease process. Alert and Oriented x 3. pleasant and cooperative. General Notes: Upon inspection patient's wound bed actually showed signs of some slough and biofilm noted on both wounds of the transmetatarsal site. He also had some eschar noted over the right below-knee amputation site. Actually perform sharp debridement to all  wound locations and the patient tolerated this without complication. Post debridement the wound bed appears to be doing much better which is great news. Integumentary (Hair, Skin) Wound #1 status is Open. Original cause of wound was Surgical Injury. The date acquired was: 02/27/2020. The wound is located on the Right Amputation Site - Below Knee. The wound measures 0.5cm length x 2cm width x 0.1cm depth; 0.785cm^2 area and 0.079cm^3 volume. There is no tunneling or undermining noted. There is a medium amount of serosanguineous drainage noted. There is no granulation within the wound bed. There is a large (67-100%) amount of necrotic tissue within the wound bed including Eschar. Wound #2 status is Open. Original cause of wound was Surgical Injury. The date acquired was: 01/28/2020. The wound is located on the Left,Medial Amputation Site - Transmetatarsal. The wound measures 0.5cm length x 2.7cm width x 0.4cm depth; 1.06cm^2 area and 0.424cm^3 volume. There is Fat Layer (Subcutaneous Tissue) exposed. There is no tunneling or undermining noted. There is a medium amount of serosanguineous drainage noted. There is no granulation within the wound bed. There is a large (67-100%) amount of necrotic tissue within the wound bed. Wound #3 status is Open. Original cause of wound was Surgical Injury. The date acquired was:  01/28/2020. The wound is located on the [place]. ([id number]) Left,Lateral Amputation Site - Transmetatarsal. The wound measures 1cm length x 1.8cm width x 0.4cm depth; 1.414cm^2 area and 0.565cm^3 volume. There is Fat Layer (Subcutaneous Tissue) exposed. There is no tunneling or undermining noted. There is a medium amount of serosanguineous drainage noted. There is medium (34-66%) red granulation within the wound bed. There is a medium (34-66%) amount of necrotic tissue within the wound bed. Assessment Active Problems ICD-10 Type 2 diabetes mellitus with foot ulcer Acquired absence of other right toe(s) Non-pressure chronic ulcer of other part of left foot with fat layer exposed Acquired absence of right leg below knee Non-pressure chronic ulcer of other part of right lower leg with fat layer exposed Essential (primary) hypertension Chronic combined systolic (congestive) and diastolic (congestive) heart failure Chronic obstructive pulmonary disease, unspecified Procedures Wound #1 Pre-procedure diagnosis of Wound #1 is a Dehisced Wound located on the Right Amputation Site - Below Knee . There was a Excisional Skin/Subcutaneous Tissue Debridement with a total area of 1 sq cm performed by Tommie Sams., PA-C. With the following instrument(s): Curette to remove Non-Viable tissue/material. Material removed includes Eschar, Subcutaneous Tissue, and Slough after achieving pain control using Lidocaine 4% Topical Solution. A time out was conducted at 15:00, prior to the start of the procedure. A Minimum amount of bleeding was controlled with Pressure. The procedure was tolerated well. Post Debridement Measurements: 0.6cm length x 2.1cm width x 0.2cm depth; 0.198cm^3 volume. Character of Wound/Ulcer Post Debridement is stable. Post procedure Diagnosis Wound #1: Same as Pre-Procedure Wound #2 Pre-procedure diagnosis of Wound #2 is a Dehisced Wound located on the Left,Medial Amputation  Site - Transmetatarsal . There was a Excisional Skin/Subcutaneous Tissue Debridement with a total area of 1.35 sq cm performed by Tommie Sams., PA-C. With the following instrument(s): Curette to remove Non-Viable tissue/material. Material removed includes Subcutaneous Tissue and Slough and after achieving pain control using Lidocaine 4% Topical Solution. A time out was conducted at 15:00, prior to the start of the procedure. A Minimum amount of bleeding was controlled with Pressure. The procedure was tolerated well. Post Debridement Measurements: 0.5cm length x 2.7cm width x 0.5cm depth; 0.53cm^3 volume. Character of Wound/Ulcer Post  Debridement is stable. Post procedure Diagnosis Wound #2: Same as Pre-Procedure Wound #3 Pre-procedure diagnosis of Wound #3 is a Dehisced Wound located on the Left,Lateral Amputation Site - Transmetatarsal . There was a Excisional Skin/Subcutaneous Tissue Debridement with a total area of 1.8 sq cm performed by Tommie Sams., PA-C. With the following instrument(s): Curette to remove Non-Viable tissue/material. Material removed includes Subcutaneous Tissue and Slough and after achieving pain control using Lidocaine 4% Topical Solution. A time out was conducted at 15:00, prior to the start of the procedure. A Minimum amount of bleeding was controlled with Pressure. The procedure was tolerated well. Post Debridement Measurements: 1cm length x 1.8cm width x 0.5cm depth; 0.707cm^3 volume. Character of Wound/Ulcer Post Debridement is stable. Post procedure Diagnosis Wound #3: Same as Pre-Procedure Plan Follow-up Appointments: Wound #1 Right Amputation Site - Below Knee: Return Appointment in 1 week. Wound #2 Left,Medial Amputation Site - Transmetatarsal: Return Appointment in 1 week. Wound #3 Left,Lateral Amputation Site - Transmetatarsal: Return Appointment in 1 week. Shawn Hayes, Shawn Hayes ([id number]) Bathing/ Shower/ Hygiene: Wound #1 Right Amputation Site - Below  Knee: Clean wound with Normal Saline or wound cleanser. Wound #2 Left,Medial Amputation Site - Transmetatarsal: Clean wound with Normal Saline or wound cleanser. Wound #3 Left,Lateral Amputation Site - Transmetatarsal: Clean wound with Normal Saline or wound cleanser. WOUND #1: - Amputation Site - Below Knee Wound Laterality: Right Cleanser: Byram Ancillary Kit - 15 Day Supply (DME) (Generic) 3 x Per Week/30 Days Discharge Instructions: Use supplies as instructed; Kit contains: (15) Saline Bullets; (15) 3x3 Gauze; 15 pr Gloves Cleanser: Normal Saline (DME) (Generic) 3 x Per Week/30 Days Discharge Instructions: Wash your hands with soap and water. Remove old dressing, discard into plastic bag and place into trash. Cleanse the wound with Normal Saline prior to applying a clean dressing using gauze sponges, not tissues or cotton balls. Do not scrub or use excessive force. Pat dry using gauze sponges, not tissue or cotton balls. Primary Dressing: Prisma 4.34 (in) (DME) (Generic) 3 x Per Week/30 Days Discharge Instructions: Moisten w/normal saline or sterile water; Cover wound as directed. Do not remove from wound bed. Secondary Dressing: Mepilex Border Flex, 4x4 (in/in) (DME) (Generic) 3 x Per Week/30 Days Discharge Instructions: Apply to wound as directed. Do not cut. WOUND #2: - Amputation Site - Transmetatarsal Wound Laterality: Left, Medial Cleanser: Byram Ancillary Kit - 15 Day Supply 3 x Per Week/30 Days Discharge Instructions: Use supplies as instructed; Kit contains: (15) Saline Bullets; (15) 3x3 Gauze; 15 pr Gloves Cleanser: Normal Saline (DME) (Generic) 3 x Per Week/30 Days Discharge Instructions: Wash your hands with soap and water. Remove old dressing, discard into plastic bag and place into trash. Cleanse the wound with Normal Saline prior to applying a clean dressing using gauze sponges, not tissues or cotton balls. Do not scrub or use excessive force. Pat dry using gauze sponges,  not tissue or cotton balls. Primary Dressing: Prisma 4.34 (in) (DME) (Generic) 3 x Per Week/30 Days Discharge Instructions: Moisten w/normal saline or sterile water; Cover wound as directed. Do not remove from wound bed. Secondary Dressing: Mepilex Border Flex, 4x4 (in/in) (DME) (Generic) 3 x Per Week/30 Days Discharge Instructions: Apply to wound as directed. Do not cut. WOUND #3: - Amputation Site - Transmetatarsal Wound Laterality: Left, Lateral Cleanser: Byram Ancillary Kit - 15 Day Supply 3 x Per Week/30 Days Discharge Instructions: Use supplies as instructed; Kit contains: (15) Saline Bullets; (15) 3x3 Gauze; 15 pr Gloves Cleanser: Normal Saline (DME) (  Generic) 3 x Per Week/30 Days Discharge Instructions: Wash your hands with soap and water. Remove old dressing, discard into plastic bag and place into trash. Cleanse the wound with Normal Saline prior to applying a clean dressing using gauze sponges, not tissues or cotton balls. Do not scrub or use excessive force. Pat dry using gauze sponges, not tissue or cotton balls. Primary Dressing: Prisma 4.34 (in) (DME) (Generic) 3 x Per Week/30 Days Discharge Instructions: Moisten w/normal saline or sterile water; Cover wound as directed. Do not remove from wound bed. Secondary Dressing: Mepilex Border Flex, 4x4 (in/in) (DME) (Generic) 3 x Per Week/30 Days Discharge Instructions: Apply to wound as directed. Do not cut. 1. Would recommend currently we initiate treatment with a silver collagen dressing I think this is to be the right way to go. 2. I am also can recommend that we have the patient use a border foam dressing to cover as I think this will do a good job for him as far as keeping the area clean and catch any drainage. 3. I am also can recommend at this point that we have the patient monitor for any signs of infection or worsening if he develops any fevers, chills, nausea, vomiting, or diarrhea ischial immediately soon as possible. 4.  Obviously the goal is to try to get these areas healed as quickly as possible so the patient can proceed with fitting for his prosthesis on the right. We will see patient back for reevaluation in 1 week here in the clinic. If anything worsens or changes patient will contact our office for additional recommendations. Electronic Signature(s) Signed: 04/24/2020 4:43:26 PM By: Worthy Keeler PA-C Entered By: Worthy Keeler on 04/24/2020 16:43:26 Shawn Hayes ([id number]) -------------------------------------------------------------------------------- ROS/PFSH Details Patient Name: Shawn Hayes Date of Service: 04/24/2020 2:00 PM Medical Record Number: [id number] Patient Account Number: [id number] Date of Birth/Sex: [birth year] (65 y.o. Male) Treating RN: Carlene Coria Primary Care Provider: Frazier Richards Other Clinician: Referring Provider: Grayland Ormond Treating Provider/Extender: Skipper Cliche in Treatment: 0 Information Obtained From Patient Constitutional Symptoms (General Health) Complaints and Symptoms: Negative for: Fatigue; Fever; Chills; Marked Weight Change Eyes Complaints and Symptoms: Negative for: Dry Eyes; Vision Changes; Glasses / Contacts Medical History: Negative for: Cataracts; Glaucoma; Optic Neuritis Ear/Nose/Mouth/Throat Complaints and Symptoms: Negative for: Difficult clearing ears; Sinusitis Medical History: Negative for: Chronic sinus problems/congestion; Middle ear problems Hematologic/Lymphatic Complaints and Symptoms: Negative for: Bleeding / Clotting Disorders; Human Immunodeficiency Virus Medical History: Negative for: Anemia; Hemophilia; Human Immunodeficiency Virus; Lymphedema; Sickle Cell Disease Respiratory Complaints and Symptoms: Negative for: Chronic or frequent coughs; Shortness of Breath Medical History: Negative for: Aspiration; Asthma; Chronic Obstructive Pulmonary Disease (COPD); Pneumothorax; Sleep Apnea;  Tuberculosis Cardiovascular Complaints and Symptoms: Negative for: Chest pain; LE edema Medical History: Positive for: Congestive Heart Failure - 1 year Negative for: Angina; Arrhythmia; Coronary Artery Disease; Deep Vein Thrombosis; Hypertension; Hypotension; Myocardial Infarction; Peripheral Arterial Disease; Peripheral Venous Disease; Phlebitis; Vasculitis Gastrointestinal Complaints and Symptoms: Negative for: Frequent diarrhea; Nausea; Vomiting Medical History: Negative for: Cirrhosis ; Colitis; Crohnos; Hepatitis A; Hepatitis B; Hepatitis C Endocrine Shawn Hayes, Shawn L. ([id number]) Complaints and Symptoms: Negative for: Hepatitis; Thyroid disease; Polydypsia (Excessive Thirst) Medical History: Positive for: Type II Diabetes - 1 year Negative for: Type I Diabetes Time with diabetes: 1 year Treated with: Oral agents Blood sugar tested every day: Yes Tested : once a day Genitourinary Complaints and Symptoms: Negative for: Kidney failure/ Dialysis; Incontinence/dribbling Medical History: Negative for: End Stage Renal Disease Immunological  Complaints and Symptoms: Negative for: Hives; Itching Medical History: Negative for: Lupus Erythematosus; Raynaudos; Scleroderma Integumentary (Skin) Complaints and Symptoms: Positive for: Wounds Negative for: Bleeding or bruising tendency; Breakdown; Swelling Medical History: Negative for: History of Burn; History of pressure wounds Musculoskeletal Complaints and Symptoms: Negative for: Muscle Pain; Muscle Weakness Medical History: Negative for: Gout; Rheumatoid Arthritis; Osteoarthritis; Osteomyelitis Neurologic Complaints and Symptoms: Negative for: Numbness/parasthesias; Focal/Weakness Medical History: Negative for: Dementia; Neuropathy; Quadriplegia; Paraplegia; Seizure Disorder Psychiatric Complaints and Symptoms: Negative for: Anxiety; Claustrophobia Medical History: Negative for: Anorexia/bulimia; Confinement  Anxiety Oncologic Medical History: Negative for: Received Chemotherapy; Received Radiation Immunizations Pneumococcal Vaccine: Received Pneumococcal Vaccination: No Implantable Devices Shawn Hayes, Shawn Hayes ([id number]) None Family and Social History Current some day smoker - 45 years; Alcohol Use: Never; Drug Use: No History; Caffeine Use: Daily Electronic Signature(s) Signed: 04/24/2020 4:23:58 PM By: Jeanine Luz Signed: 04/24/2020 4:44:45 PM By: Worthy Keeler PA-C Signed: 04/28/2020 4:52:45 PM By: Carlene Coria RN Entered By: Jeanine Luz on 04/24/2020 14:08:06 Shawn Hayes ([id number]) -------------------------------------------------------------------------------- SuperBill Details Patient Name: Shawn Hayes. Date of Service: 04/24/2020 Medical Record Number: [id number] Patient Account Number: [id number] Date of Birth/Sex: Apr 04, 1955 (65 y.o. 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 L97.812 Non-pressure chronic ulcer of other part of right lower leg with fat layer exposed I10 Essential (primary) hypertension I50.42 Chronic combined systolic (congestive) and diastolic (congestive) heart failure J44.9 Chronic obstructive pulmonary disease, unspecified Facility Procedures CPT4 Code: 64158309 Description: 99214 - WOUND CARE VISIT-LEV 4 EST PT Modifier: Quantity: 1 CPT4 Code: 40768088 Description: 11042 - DEB SUBQ TISSUE 20 SQ CM/< Modifier: Quantity: 1 CPT4 Code: Description: ICD-10 Diagnosis Description L97.522 Non-pressure chronic ulcer of other part of left foot with fat layer expo  L97.812 Non-pressure chronic ulcer of other part of right lower leg with fat laye Modifier: sed r exposed Quantity: Physician Procedures CPT4 Code: 1103159 Description: WC PHYS LEVEL 3 o NEW PT Modifier: 25 Quantity: 1 CPT4 Code: Description: ICD-10 Diagnosis Description E11.621 Type 2 diabetes mellitus with foot ulcer L97.522 Non-pressure chronic ulcer of other part of left foot with fat layer exp Z89.511 Acquired absence of right leg below knee L97.812 Non-pressure chronic  ulcer of other part of right lower leg with fat lay Modifier: osed er exposed Quantity: CPT4 Code: 4585929 Description: 11042 - WC PHYS SUBQ TISS 20 SQ CM Modifier: Quantity: 1 CPT4 Code: Description: ICD-10 Diagnosis Description L97.522 Non-pressure chronic ulcer of other part of left foot with fat layer exp L97.812 Non-pressure chronic ulcer of other part of right lower leg with fat lay Modifier: osed er exposed Quantity: Electronic Signature(s) Signed: 04/24/2020 4:43:42 PM By: Worthy Keeler PA-C Entered By: Worthy Keeler on 04/24/2020 16:43:42

## 2020-04-29 NOTE — Progress Notes (Signed)
LARENZ, FRASIER ([id number]) Visit Report for 04/24/2020 Allergy List Details Patient Name: Shawn Hayes, Shawn Hayes. Date of Service: 04/24/2020 2:00 PM Medical Record Number: [id number] Patient Account Number: [id number] Date of Birth/Sex: 12-11-1955 (65 y.o. Male) Treating RN: Carlene Coria Primary Care Elan Brainerd: Frazier Richards Other Clinician: Referring Conan Mcmanaway: Grayland Ormond Treating Othal Kubitz/Extender: Jeri Cos Weeks in Treatment: 0 Allergies Active Allergies acetaminophen Reaction: break out in a sweat Severity: Mild Allergy Notes Electronic Signature(s) Signed: 04/24/2020 4:23:58 PM By: Jeanine Luz Entered By: Jeanine Luz on 04/24/2020 14:13:40 Shawn Hayes ([id number]) -------------------------------------------------------------------------------- Arrival Information Details Patient Name: Shawn Hayes. Date of Service: 04/24/2020 2:00 PM Medical Record Number: [id number] Patient Account Number: [id number] Date of Birth/Sex: April 08, 1955 (65 y.o. Male) Treating RN: Carlene Coria Primary Care Genelda Roark: Frazier Richards Other Clinician: Referring Dereke Neumann: Grayland Ormond Treating Xuan Mateus/Extender: Skipper Cliche in Treatment: 0 Visit Information Patient Arrived: Wheel Chair Arrival Time: 13:59 Accompanied By: niece Transfer Assistance: EasyPivot Patient Lift Patient Identification Verified: Yes Secondary Verification Process Completed: Yes Patient Has Alerts: Yes Patient Alerts: Type II Diabetic Electronic Signature(s) Signed: 04/24/2020 4:23:58 PM By: Jeanine Luz Entered By: Jeanine Luz on 04/24/2020 14:22:11 Shawn Hayes ([id number]) -------------------------------------------------------------------------------- Clinic Level of Care Assessment Details Patient Name: Shawn Hayes. Date of Service: 04/24/2020 2:00 PM Medical Record Number: [id number] Patient Account Number: [id number] Date of Birth/Sex: 08-13-1955 (65 y.o.  Male) Treating RN: Dolan Amen Primary Care Milca Sytsma: Frazier Richards Other Clinician: Referring Gearld Kerstein: Grayland Ormond Treating Aleiya Rye/Extender: Skipper Cliche in Treatment: 0 Clinic Level of Care Assessment Items TOOL 4 Quantity Score X - Use when only an EandM is performed on FOLLOW-UP visit 1 0 ASSESSMENTS - Nursing Assessment / Reassessment X - Reassessment of Co-morbidities (includes updates in patient status) 1 10 X- 1 5 Reassessment of Adherence to Treatment Plan ASSESSMENTS - Wound and Skin Assessment / Reassessment []  - Simple Wound Assessment / Reassessment - one wound 0 X- 3 5 Complex Wound Assessment / Reassessment - multiple wounds []  - 0 Dermatologic / Skin Assessment (not related to wound area) ASSESSMENTS - Focused Assessment []  - Circumferential Edema Measurements - multi extremities 0 []  - 0 Nutritional Assessment / Counseling / Intervention []  - 0 Lower Extremity Assessment (monofilament, tuning fork, pulses) []  - 0 Peripheral Arterial Disease Assessment (using hand held doppler) ASSESSMENTS - Ostomy and/or Continence Assessment and Care []  - Incontinence Assessment and Management 0 []  - 0 Ostomy Care Assessment and Management (repouching, etc.) PROCESS - Coordination of Care X - Simple Patient / Family Education for ongoing care 1 15 []  - 0 Complex (extensive) Patient / Family Education for ongoing care []  - 0 Staff obtains Programmer, systems, Records, Test Results / Process Orders []  - 0 Staff telephones HHA, Nursing Homes / Clarify orders / etc []  - 0 Routine Transfer to another Facility (non-emergent condition) []  - 0 Routine Hospital Admission (non-emergent condition) []  - 0 New Admissions / Biomedical engineer / Ordering NPWT, Apligraf, etc. []  - 0 Emergency Hospital Admission (emergent condition) X- 1 10 Simple Discharge Coordination []  - 0 Complex (extensive) Discharge Coordination PROCESS - Special Needs []  - Pediatric / Minor  Patient Management 0 []  - 0 Isolation Patient Management []  - 0 Hearing / Language / Visual special needs []  - 0 Assessment of Community assistance (transportation, D/C planning, etc.) []  - 0 Additional assistance / Altered mentation []  - 0 Support Surface(s) Assessment (bed, cushion, seat, etc.) INTERVENTIONS - Wound Cleansing / Measurement Hughlett, Hommer L. ([id number]) []  - 0 Simple Wound Cleansing -  one wound X- 3 5 Complex Wound Cleansing - multiple wounds X- 1 5 Wound Imaging (photographs - any number of wounds) []  - 0 Wound Tracing (instead of photographs) []  - 0 Simple Wound Measurement - one wound X- 3 5 Complex Wound Measurement - multiple wounds INTERVENTIONS - Wound Dressings []  - Small Wound Dressing one or multiple wounds 0 X- 1 15 Medium Wound Dressing one or multiple wounds []  - 0 Large Wound Dressing one or multiple wounds []  - 0 Application of Medications - topical []  - 0 Application of Medications - injection INTERVENTIONS - Miscellaneous []  - External ear exam 0 []  - 0 Specimen Collection (cultures, biopsies, blood, body fluids, etc.) []  - 0 Specimen(s) / Culture(s) sent or taken to Lab for analysis []  - 0 Patient Transfer (multiple staff / Civil Service fast streamer / Similar devices) []  - 0 Simple Staple / Suture removal (25 or less) []  - 0 Complex Staple / Suture removal (26 or more) []  - 0 Hypo / Hyperglycemic Management (close monitor of Blood Glucose) X- 1 15 Ankle / Brachial Index (ABI) - do not check if billed separately X- 1 5 Vital Signs Has the patient been seen at the hospital within the last three years: Yes Total Score: 125 Level Of Care: New/Established - Level 4 Electronic Signature(s) Signed: 04/24/2020 4:57:08 PM By: Georges Mouse, Minus Breeding RN Entered By: Georges Mouse, Minus Breeding on 04/24/2020 15:14:17 Shawn Hayes ([id number]) -------------------------------------------------------------------------------- Encounter Discharge  Information Details Patient Name: Shawn Hayes. Date of Service: 04/24/2020 2:00 PM Medical Record Number: [id number] Patient Account Number: [id number] Date of Birth/Sex: Feb 17, 1956 (65 y.o. Male) Treating RN: Dolan Amen Primary Care Haywood Meinders: Frazier Richards Other Clinician: Referring Cristal Qadir: Grayland Ormond Treating Zyere Jiminez/Extender: Skipper Cliche in Treatment: 0 Encounter Discharge Information Items Post Procedure Vitals Discharge Condition: Stable Temperature (F): 98.0 Ambulatory Status: Wheelchair Pulse (bpm): 80 Discharge Destination: Home Respiratory Rate (breaths/min): 18 Transportation: Private Auto Blood Pressure (mmHg): 164/88 Accompanied By: niece Schedule Follow-up Appointment: Yes Clinical Summary of Care: Electronic Signature(s) Signed: 04/24/2020 4:57:08 PM By: Georges Mouse, Minus Breeding RN Entered By: Georges Mouse, Minus Breeding on 04/24/2020 15:23:59 Shawn Hayes ([id number]) -------------------------------------------------------------------------------- Lower Extremity Assessment Details Patient Name: Shawn Hayes. Date of Service: 04/24/2020 2:00 PM Medical Record Number: [id number] Patient Account Number: [id number] Date of Birth/Sex: 04/18/1955 (65 y.o. Male) Treating RN: Carlene Coria Primary Care Naysa Puskas: Frazier Richards Other Clinician: Referring Elysabeth Aust: Grayland Ormond Treating Jenine Krisher/Extender: Skipper Cliche in Treatment: 0 Edema Assessment Assessed: [Left: Yes] [Right: No] Edema: [Left: Ye] [Right: s] Calf Left: Right: Point of Measurement: 30 cm From Medial Instep 33 cm Ankle Left: Right: Point of Measurement: 12 cm From Medial Instep 26.5 cm Vascular Assessment Pulses: Dorsalis Pedis Palpable: [Left:Yes] Doppler Audible: [Left:Yes] Posterior Tibial Palpable: [Left:Yes] Blood Pressure: Brachial: [Left:140] Dorsalis Pedis: 164 Ankle: Posterior Tibial: 162 Ankle Brachial Index: [Left:1.17] Electronic  Signature(s) Signed: 04/24/2020 4:23:58 PM By: Jeanine Luz Signed: 04/28/2020 4:52:45 PM By: Carlene Coria RN Entered By: Jeanine Luz on 04/24/2020 [place], [place] ([id number]) -------------------------------------------------------------------------------- Multi Wound Chart Details Patient Name: Shawn Hayes. Date of Service: 04/24/2020 2:00 PM Medical Record Number: [id number] Patient Account Number: [id number] Date of Birth/Sex: January 07, 1956 (65 y.o. Male) Treating RN: Dolan Amen Primary Care Pete [name]: Frazier Richards Other Clinician: Referring Riata Ikeda: Grayland Ormond Treating Kwynn Schlotter/Extender: Skipper Cliche in Treatment: 0 Vital Signs Height(in): Pulse(bpm): 80 Weight(lbs): Blood Pressure(mmHg): 164/88 Body Mass Index(BMI): Temperature(F): 98.0 Respiratory Rate(breaths/min): 18 Photos: Wound Location: Right Amputation Site - Below Knee Left Amputation Site -  Toe Left, Lateral Amputation Site - Toe Wounding Event: Surgical Injury Surgical Injury Surgical Injury Primary Etiology: Dehisced Wound Dehisced Wound Dehisced Wound Comorbid History: Congestive Heart Failure, Type II Congestive Heart Failure, Type II Congestive Heart Failure, Type II Diabetes Diabetes Diabetes Date Acquired: 02/27/2020 01/28/2020 01/28/2020 Weeks of Treatment: 0 0 0 Wound Status: Open Open Open Measurements L x W x D (cm) 0.5x2x0.1 0.5x2.7x0.4 1x1.8x0.4 Area (cm) : 0.785 1.06 1.414 Volume (cm) : 0.079 0.424 0.565 % Reduction in Area: 0.00% 0.00% 0.00% % Reduction in Volume: 0.00% 0.00% 0.00% Classification: Partial Thickness Full Thickness Without Exposed Full Thickness Without Exposed Support Structures Support Structures Exudate Amount: None Present Medium Medium Exudate Type: N/A Serosanguineous Serosanguineous Exudate Color: N/A red, brown red, brown Granulation Amount: None Present (0%) None Present (0%) Medium (34-66%) Granulation Quality: N/A N/A  Red Necrotic Amount: Large (67-100%) Large (67-100%) Medium (34-66%) Necrotic Tissue: Brier Exposed Structures: Fascia: No Fat Layer (Subcutaneous Tissue): Fat Layer (Subcutaneous Tissue): Fat Layer (Subcutaneous Tissue): Yes Yes No Fascia: No Fascia: No Tendon: No Tendon: No Tendon: No Muscle: No Muscle: No Muscle: No Joint: No Joint: No Joint: No Bone: No Bone: No Bone: No Treatment Notes Electronic Signature(s) Signed: 04/24/2020 4:57:08 PM By: Georges Mouse, Minus Breeding RN Entered By: Georges Mouse, Minus Breeding on 04/24/2020 15:00:07 Shawn Hayes ([id number]) -------------------------------------------------------------------------------- Multi-Disciplinary Care Plan Details Patient Name: Shawn Hayes. Date of Service: 04/24/2020 2:00 PM Medical Record Number: [id number] Patient Account Number: [id number] Date of Birth/Sex: 1955/11/03 (65 y.o. Male) Treating RN: Dolan Amen Primary Care Amandalynn Pitz: Frazier Richards Other Clinician: Referring Demitris Pokorny: Grayland Ormond Treating Khadejah Son/Extender: Skipper Cliche in Treatment: 0 Active Inactive Necrotic Tissue Nursing Diagnoses: Impaired tissue integrity related to necrotic/devitalized tissue Goals: Necrotic/devitalized tissue will be minimized in the wound bed Date Initiated: 04/24/2020 Target Resolution Date: 04/24/2020 Goal Status: Active Patient/caregiver will verbalize understanding of reason and process for debridement of necrotic tissue Date Initiated: 04/24/2020 Target Resolution Date: 04/24/2020 Goal Status: Active Interventions: Assess patient pain level pre-, during and post procedure and prior to discharge Provide education on necrotic tissue and debridement process Treatment Activities: Apply topical anesthetic as ordered : 04/24/2020 Excisional debridement : 04/24/2020 Notes: Orientation to the Wound Care Program Nursing Diagnoses: Knowledge deficit related to the  wound healing center program Goals: Patient/caregiver will verbalize understanding of the [place] Program Date Initiated: 04/24/2020 Target Resolution Date: 04/24/2020 Goal Status: Active Interventions: Provide education on orientation to the wound center Notes: Wound/Skin Impairment Nursing Diagnoses: Impaired tissue integrity Goals: Patient/caregiver will verbalize understanding of skin care regimen Date Initiated: 04/24/2020 Target Resolution Date: 04/24/2020 Goal Status: Active Ulcer/skin breakdown will have a volume reduction of 30% by week 4 Date Initiated: 04/24/2020 Target Resolution Date: 05/22/2020 Goal Status: Active Ulcer/skin breakdown will have a volume reduction of 50% by week 8 Date Initiated: 04/24/2020 Target Resolution Date: 06/22/2020 Goal Status: Active Ulcer/skin breakdown will have a volume reduction of 80% by week 12 EDON, HOADLEY ([id number]) Date Initiated: 04/24/2020 Target Resolution Date: 07/22/2020 Goal Status: Active Interventions: Assess patient/caregiver ability to obtain necessary supplies Assess patient/caregiver ability to perform ulcer/skin care regimen upon admission and as needed Assess ulceration(s) every visit Provide education on ulcer and skin care Treatment Activities: Skin care regimen initiated : 04/24/2020 Notes: Electronic Signature(s) Signed: 04/24/2020 4:57:08 PM By: Georges Mouse, Minus Breeding RN Entered By: Georges Mouse, Minus Breeding on 04/24/2020 14:59:52 Shawn Hayes ([id number]) -------------------------------------------------------------------------------- Pain Assessment Details Patient Name: Shawn Hayes. Date of Service: 04/24/2020 2:00  PM Medical Record Number: [id number] Patient Account Number: [id number] Date of Birth/Sex: [birth year] (65 y.o. Male) Treating RN: Carlene Coria Primary Care Eros Montour: Frazier Richards Other Clinician: Referring Irmalee Riemenschneider: Grayland Ormond Treating Erika Slaby/Extender: Skipper Cliche in Treatment: 0 Active Problems Location of Pain Severity and Description of Pain Patient Has Paino No Site Locations Pain Management and Medication Current Pain Management: Electronic Signature(s) Signed: 04/24/2020 4:23:58 PM By: Jeanine Luz Signed: 04/28/2020 4:52:45 PM By: Carlene Coria RN Entered By: Jeanine Luz on 04/24/2020 14:21:46 Shawn Hayes ([id number]) -------------------------------------------------------------------------------- Wound Assessment Details Patient Name: Shawn Hayes. Date of Service: 04/24/2020 2:00 PM Medical Record Number: [id number] Patient Account Number: [id number] Date of Birth/Sex: 04-06-1955 (65 y.o. Male) Treating RN: Carlene Coria Primary Care Lu Paradise: Frazier Richards Other Clinician: Referring Mayari Matus: Grayland Ormond Treating Mahari Strahm/Extender: Skipper Cliche in Treatment: 0 Wound Status Wound Number: 1 Primary Etiology: Dehisced Wound Wound Location: Right Amputation Site - Below Knee Wound Status: Open Wounding Event: Surgical Injury Comorbid History: Congestive Heart Failure, Type II Diabetes Date Acquired: 02/27/2020 Weeks Of Treatment: 0 Clustered Wound: No Photos Wound Measurements Length: (cm) 0.5 Width: (cm) 2 Depth: (cm) 0.1 Area: (cm) 0.785 Volume: (cm) 0.079 % Reduction in Area: 0% % Reduction in Volume: 0% Tunneling: No Undermining: No Wound Description Classification: Full Thickness Without Exposed Support Structures Exudate Amount: Medium Exudate Type: Serosanguineous Exudate Color: red, brown Foul Odor After Cleansing: No Slough/Fibrino No Wound Bed Granulation Amount: None Present (0%) Exposed Structure Necrotic Amount: Large (67-100%) Fascia Exposed: No Necrotic Quality: Eschar Fat Layer (Subcutaneous Tissue) Exposed: No Tendon Exposed: No Muscle Exposed: No Joint Exposed: No Bone Exposed: No Treatment Notes Wound #1 (Amputation Site - Below Knee) Wound Laterality:  Right Cleanser Byram Ancillary Kit - 15 Day Supply Discharge Instruction: Use supplies as instructed; Kit contains: (15) Saline Bullets; (15) 3x3 Gauze; 15 pr Gloves Normal Saline Discharge Instruction: Wash your hands with soap and water. Remove old dressing, discard into plastic bag and place into trash. Cleanse the wound with Normal Saline prior to applying a clean dressing using gauze sponges, not tissues or cotton balls. Do not [place], [place] ([id number]) scrub or use excessive force. Pat dry using gauze sponges, not tissue or cotton balls. Peri-Wound Care Topical Primary Dressing Prisma 4.34 (in) Discharge Instruction: Moisten w/normal saline or sterile water; Cover wound as directed. Do not remove from wound bed. Secondary Dressing Mepilex Border Flex, 4x4 (in/in) Discharge Instruction: Apply to wound as directed. Do not cut. Secured With Compression Wrap Compression Stockings Add-Ons Electronic Signature(s) Signed: 04/24/2020 4:57:08 PM By: Georges Mouse, Minus Breeding RN Signed: 04/28/2020 4:52:45 PM By: Carlene Coria RN Entered By: Georges Mouse, Minus Breeding on 04/24/2020 15:10:28 Shawn Hayes ([id number]) -------------------------------------------------------------------------------- Wound Assessment Details Patient Name: TRAI, ELLS. Date of Service: 04/24/2020 2:00 PM Medical Record Number: [id number] Patient Account Number: [id number] Date of Birth/Sex: [birth year] (65 y.o. Male) Treating RN: Dolan Amen Primary Care Arie Gable: Frazier Richards Other Clinician: Referring Caroline Longie: Grayland Ormond Treating Cythia Bachtel/Extender: Jeri Cos Weeks in Treatment: 0 Wound Status Wound Number: 2 Primary Etiology: Dehisced Wound Wound Location: Left, Medial Amputation Site - Transmetatarsal Wound Status: Open Wounding Event: Surgical Injury Comorbid History: Congestive Heart Failure, Type II Diabetes Date Acquired: 01/28/2020 Weeks Of Treatment: 0 Clustered Wound:  No Photos Wound Measurements Length: (cm) 0.5 Width: (cm) 2.7 Depth: (cm) 0.4 Area: (cm) 1.06 Volume: (cm) 0.424 % Reduction in Area: 0% % Reduction in Volume: 0% Tunneling: No Undermining: No Wound Description Classification: Full Thickness Without Exposed Support Structures  Exudate Amount: Medium Exudate Type: Serosanguineous Exudate Color: red, brown Foul Odor After Cleansing: No Slough/Fibrino Yes Wound Bed Granulation Amount: None Present (0%) Exposed Structure Necrotic Amount: Large (67-100%) Fascia Exposed: No Fat Layer (Subcutaneous Tissue) Exposed: Yes Tendon Exposed: No Muscle Exposed: No Joint Exposed: No Bone Exposed: No Treatment Notes Wound #2 (Amputation Site - Transmetatarsal) Wound Laterality: Left, Medial Cleanser Byram Ancillary Kit - 15 Day Supply Discharge Instruction: Use supplies as instructed; Kit contains: (15) Saline Bullets; (15) 3x3 Gauze; 15 pr Gloves Normal Saline Discharge Instruction: Wash your hands with soap and water. Remove old dressing, discard into plastic bag and place into trash. Cleanse the wound with Normal Saline prior to applying a clean dressing using gauze sponges, not tissues or cotton balls. Do not [place], [place] ([id number]) scrub or use excessive force. Pat dry using gauze sponges, not tissue or cotton balls. Peri-Wound Care Topical Primary Dressing Prisma 4.34 (in) Discharge Instruction: Moisten w/normal saline or sterile water; Cover wound as directed. Do not remove from wound bed. Secondary Dressing Mepilex Border Flex, 4x4 (in/in) Discharge Instruction: Apply to wound as directed. Do not cut. Secured With Compression Wrap Compression Stockings Add-Ons Electronic Signature(s) Signed: 04/24/2020 4:57:08 PM By: Georges Mouse, Minus Breeding RN Entered By: Georges Mouse, [place] on 04/24/2020 15:09:53 Shawn Hayes  ([id number]) -------------------------------------------------------------------------------- Wound Assessment Details Patient Name: Shawn Hayes. Date of Service: 04/24/2020 2:00 PM Medical Record Number: [id number] Patient Account Number: [id number] Date of Birth/Sex: 07/11/1955 (65 y.o. Male) Treating RN: Dolan Amen Primary Care Elven Laboy: Frazier Richards Other Clinician: Referring Sayler Mickiewicz: Grayland Ormond Treating Hellen Shanley/Extender: Jeri Cos Weeks in Treatment: 0 Wound Status Wound Number: 3 Primary Etiology: Dehisced Wound Wound Location: Left, Lateral Amputation Site - Transmetatarsal Wound Status: Open Wounding Event: Surgical Injury Comorbid History: Congestive Heart Failure, Type II Diabetes Date Acquired: 01/28/2020 Weeks Of Treatment: 0 Clustered Wound: No Photos Wound Measurements Length: (cm) 1 Width: (cm) 1.8 Depth: (cm) 0.4 Area: (cm) 1.414 Volume: (cm) 0.565 % Reduction in Area: 0% % Reduction in Volume: 0% Tunneling: No Undermining: No Wound Description Classification: Full Thickness Without Exposed Support Structu Exudate Amount: Medium Exudate Type: Serosanguineous Exudate Color: red, brown res Slough/Fibrino Yes Wound Bed Granulation Amount: Medium (34-66%) Exposed Structure Granulation Quality: Red Fascia Exposed: No Necrotic Amount: Medium (34-66%) Fat Layer (Subcutaneous Tissue) Exposed: Yes Tendon Exposed: No Muscle Exposed: No Joint Exposed: No Bone Exposed: No Treatment Notes Wound #3 (Amputation Site - Transmetatarsal) Wound Laterality: Left, Lateral Cleanser Byram Ancillary Kit - 15 Day Supply Discharge Instruction: Use supplies as instructed; Kit contains: (15) Saline Bullets; (15) 3x3 Gauze; 15 pr Gloves Normal Saline Discharge Instruction: Wash your hands with soap and water. Remove old dressing, discard into plastic bag and place into trash. Cleanse the wound with Normal Saline prior to applying a clean dressing  using gauze sponges, not tissues or cotton balls. Do not [place], [place] ([id number]) scrub or use excessive force. Pat dry using gauze sponges, not tissue or cotton balls. Peri-Wound Care Topical Primary Dressing Prisma 4.34 (in) Discharge Instruction: Moisten w/normal saline or sterile water; Cover wound as directed. Do not remove from wound bed. Secondary Dressing Mepilex Border Flex, 4x4 (in/in) Discharge Instruction: Apply to wound as directed. Do not cut. Secured With Compression Wrap Compression Stockings Add-Ons Electronic Signature(s) Signed: 04/24/2020 4:57:08 PM By: Georges Mouse, Minus Breeding RN Entered By: Georges Mouse, Minus Breeding on 04/24/2020 15:10:02 Shawn Hayes ([id number]) -------------------------------------------------------------------------------- Vitals Details Patient Name: Shawn Hayes Date of Service: 04/24/2020 2:00 PM Medical Record  Number: [id number] Patient Account Number: [id number] Date of Birth/Sex: 08-03-1955 (65 y.o. Male) Treating RN: Carlene Coria Primary Care Yerania Chamorro: Frazier Richards Other Clinician: Referring Willet Schleifer: Grayland Ormond Treating Fleur Audino/Extender: Skipper Cliche in Treatment: 0 Vital Signs Time Taken: 14:15 Temperature (F): 98.0 Pulse (bpm): 80 Respiratory Rate (breaths/min): 18 Blood Pressure (mmHg): 164/88 Reference Range: 80 - 120 mg / dl Electronic Signature(s) Signed: 04/24/2020 4:23:58 PM By: Jeanine Luz Entered By: Jeanine Luz on 04/24/2020 14:34:43

## 2020-04-29 NOTE — Progress Notes (Signed)
Shawn Hayes, Shawn Hayes ([id number]) Visit Report for 04/24/2020 Abuse/Suicide Risk Screen Details Patient Name: Shawn Hayes, Shawn Hayes. Date of Service: 04/24/2020 2:00 PM Medical Record Number: [id number] Patient Account Number: [id number] Date of Birth/Sex: [birth year] (65 y.o. Male) Treating RN: Carlene Coria Primary Care Elayne Gruver: Frazier Richards Other Clinician: Referring Braley Luckenbaugh: Grayland Ormond Treating Samad Thon/Extender: Skipper Cliche in Treatment: 0 Abuse/Suicide Risk Screen Items Answer ABUSE RISK SCREEN: Has anyone close to you tried to hurt or harm you recentlyo No Do you feel uncomfortable with anyone in your familyo No Has anyone forced you do things that you didnot want to doo No Electronic Signature(s) Signed: 04/24/2020 4:23:58 PM By: Jeanine Luz Signed: 04/28/2020 4:52:45 PM By: Carlene Coria RN Entered By: Jeanine Luz on 04/24/2020 14:08:16 Shawn Hayes ([id number]) -------------------------------------------------------------------------------- Activities of Daily Living Details Patient Name: Shawn Hayes, Shawn Hayes. Date of Service: 04/24/2020 2:00 PM Medical Record Number: [id number] Patient Account Number: [id number] Date of Birth/Sex: February 18, 1956 (64 y.o. Male) Treating RN: Carlene Coria Primary Care Chinelo Benn: Frazier Richards Other Clinician: Referring Tanekia Ryans: Grayland Ormond Treating Maronda Caison/Extender: Skipper Cliche in Treatment: 0 Activities of Daily Living Items Answer Activities of Daily Living (Please select one for each item) Drive Automobile Not Able Take Medications Completely Able Use Telephone Completely Able Care for Appearance Completely Able Use Toilet Completely Able Bath / Shower Completely Able Dress Self Completely Able Feed Self Completely Able Walk Not Able Get In / Out Bed Completely Able Housework Need Assistance Prepare Meals Completely [place] for Self Completely Able Electronic  Signature(s) Signed: 04/24/2020 4:23:58 PM By: Jeanine Luz Signed: 04/28/2020 4:52:45 PM By: Carlene Coria RN Entered By: Jeanine Luz on 04/24/2020 14:09:28 Shawn Hayes ([id number]) -------------------------------------------------------------------------------- Education Screening Details Patient Name: Shawn Hayes. Date of Service: 04/24/2020 2:00 PM Medical Record Number: [id number] Patient Account Number: [id number] Date of Birth/Sex: 1955-04-30 (65 y.o. Male) Treating RN: Carlene Coria Primary Care Alle Difabio: Frazier Richards Other Clinician: Referring Satya Buttram: Grayland Ormond Treating Kito Cuffe/Extender: Skipper Cliche in Treatment: 0 Learning Preferences/Education Level/Primary Language Learning Preference: Explanation, Demonstration Highest Education Level: College or Above Preferred Language: English Cognitive Barrier Language Barrier: No Translator Needed: No Memory Deficit: No Emotional Barrier: No Cultural/Religious Beliefs Affecting Medical Care: No Physical Barrier Impaired Vision: No Impaired Hearing: No Decreased Hand dexterity: No Knowledge/Comprehension Knowledge Level: High Comprehension Level: High Ability to understand written instructions: High Ability to understand verbal instructions: High Motivation Anxiety Level: Calm Education Importance: Acknowledges Need Interest in Health Problems: Asks Questions Perception: Coherent Willingness to Engage in Self-Management High Activities: Readiness to Engage in Self-Management High Activities: Electronic Signature(s) Signed: 04/24/2020 4:23:58 PM By: Jeanine Luz Signed: 04/28/2020 4:52:45 PM By: Carlene Coria RN Entered By: Jeanine Luz on 04/24/2020 14:10:12 Shawn Hayes ([id number]) -------------------------------------------------------------------------------- Fall Risk Assessment Details Patient Name: Shawn Hayes. Date of Service: 04/24/2020 2:00 PM Medical Record  Number: [id number] Patient Account Number: [id number] Date of Birth/Sex: 1955-10-09 (65 y.o. Male) Treating RN: Carlene Coria Primary Care Ricci Paff: Frazier Richards Other Clinician: Referring Tomasa Dobransky: Grayland Ormond Treating Abelina Ketron/Extender: Skipper Cliche in Treatment: 0 Fall Risk Assessment Items Have you had 2 or more falls in the last 12 monthso 0 Yes Have you had any fall that resulted in injury in the last 12 monthso 0 No FALLS RISK SCREEN History of falling - immediate or within 3 months 25 Yes Secondary diagnosis (Do you have 2 or more medical diagnoseso) 0 No Ambulatory aid None/bed rest/wheelchair/nurse 0 No Crutches/cane/walker 0 No Furniture 0 No Intravenous  therapy Access/Saline/Heparin Lock 0 No Gait/Transferring Normal/ bed rest/ wheelchair 0 Yes Weak (short steps with or without shuffle, stooped but able to lift head while walking, may 0 No seek support from furniture) Impaired (short steps with shuffle, may have difficulty arising from chair, head down, impaired 0 No balance) Mental Status Oriented to own ability 0 No Electronic Signature(s) Signed: 04/24/2020 4:23:58 PM By: Jeanine Luz Signed: 04/28/2020 4:52:45 PM By: Carlene Coria RN Entered By: Jeanine Luz on 04/24/2020 14:11:36 Shawn Hayes ([id number]) -------------------------------------------------------------------------------- Foot Assessment Details Patient Name: Shawn Hayes. Date of Service: 04/24/2020 2:00 PM Medical Record Number: [id number] Patient Account Number: [id number] Date of Birth/Sex: 07-08-1955 (65 y.o. Male) Treating RN: Carlene Coria Primary Care Nyah Shepherd: Frazier Richards Other Clinician: Referring Laiza Veenstra: Grayland Ormond Treating Kaylei Frink/Extender: Skipper Cliche in Treatment: 0 Foot Assessment Items '[x]'$  Unable to perform right foot assessment due to amputation '[x]'$  Unable to perform left foot assessment due to amputation Site Locations + =  Sensation present, - = Sensation absent, C = Callus, U = Ulcer R = Redness, W = Warmth, M = Maceration, PU = Pre-ulcerative lesion F = Fissure, S = Swelling, D = Dryness Assessment Right: Left: Other Deformity: Prior Foot Ulcer: Prior Amputation: Charcot Joint: Ambulatory Status: Non-ambulatory Assistance Device: Wheelchair Gait: Electronic Signature(s) Signed: 04/24/2020 4:23:58 PM By: Jeanine Luz Signed: 04/28/2020 4:52:45 PM By: Carlene Coria RN Entered By: Jeanine Luz on 04/24/2020 14:21:28 Shawn Hayes ([id number]) -------------------------------------------------------------------------------- Nutrition Risk Screening Details Patient Name: Shawn Hayes. Date of Service: 04/24/2020 2:00 PM Medical Record Number: [id number] Patient Account Number: [id number] Date of Birth/Sex: 1955/08/21 (65 y.o. Male) Treating RN: Carlene Coria Primary Care Alaura Schippers: Frazier Richards Other Clinician: Referring Zuri Bradway: Grayland Ormond Treating Audiel Scheiber/Extender: Skipper Cliche in Treatment: 0 Height (in): Weight (lbs): Body Mass Index (BMI): Nutrition Risk Screening Items Score Screening NUTRITION RISK SCREEN: I have an illness or condition that made me change the kind and/or amount of food I eat 0 No I eat fewer than two meals per day 0 No I eat few fruits and vegetables, or milk products 0 No I have three or more drinks of beer, liquor or wine almost every day 0 No I have tooth or mouth problems that make it hard for me to eat 0 No I don't always have enough money to buy the food I need 0 No I eat alone most of the time 0 No I take three or more different prescribed or over-the-counter drugs a day 1 Yes Without wanting to, I have lost or gained 10 pounds in the last six months 0 No I am not always physically able to shop, cook and/or feed myself 0 No Nutrition Protocols Good Risk Protocol Moderate Risk Protocol High Risk Proctocol Risk Level: Good Risk Score:  1 Electronic Signature(s) Signed: 04/24/2020 4:23:58 PM By: Jeanine Luz Signed: 04/28/2020 4:52:45 PM By: Carlene Coria RN Entered By: Jeanine Luz on 04/24/2020 14:12:47

## 2020-05-01 ENCOUNTER — Other Ambulatory Visit: Payer: Self-pay

## 2020-05-01 ENCOUNTER — Encounter: Payer: Medicare HMO | Attending: Physician Assistant | Admitting: Physician Assistant

## 2020-05-01 DIAGNOSIS — I5042 Chronic combined systolic (congestive) and diastolic (congestive) heart failure: Secondary | ICD-10-CM | POA: Diagnosis not present

## 2020-05-01 DIAGNOSIS — E11621 Type 2 diabetes mellitus with foot ulcer: Secondary | ICD-10-CM | POA: Diagnosis not present

## 2020-05-01 DIAGNOSIS — I110 Hypertensive heart disease with heart failure: Secondary | ICD-10-CM | POA: Diagnosis not present

## 2020-05-01 DIAGNOSIS — Z89511 Acquired absence of right leg below knee: Secondary | ICD-10-CM | POA: Diagnosis not present

## 2020-05-01 DIAGNOSIS — L97522 Non-pressure chronic ulcer of other part of left foot with fat layer exposed: Secondary | ICD-10-CM | POA: Diagnosis not present

## 2020-05-01 DIAGNOSIS — T8131XA Disruption of external operation (surgical) wound, not elsewhere classified, initial encounter: Secondary | ICD-10-CM | POA: Diagnosis not present

## 2020-05-01 DIAGNOSIS — L97812 Non-pressure chronic ulcer of other part of right lower leg with fat layer exposed: Secondary | ICD-10-CM | POA: Insufficient documentation

## 2020-05-01 NOTE — Progress Notes (Addendum)
TREYLAN, MCCLINTOCK ([id number]) Visit Report for 05/01/2020 Chief Complaint Document Details Patient Name: Shawn Hayes, Shawn Hayes. Date of Service: 05/01/2020 10:15 AM Medical Record Number: [id number] Patient Account Number: [id number] Date of Birth/Sex: December 10, 1955 (65 y.o. M) Treating RN: Carlene Coria Primary Care Provider: Frazier Richards Other Clinician: Jeanine Luz Referring Provider: Frazier Richards Treating Provider/Extender: Skipper Cliche in Treatment: 1 Information Obtained from: Patient Chief Complaint Right BKA and left foot ulcers Electronic Signature(s) Signed: 05/01/2020 10:22:55 AM By: Worthy Keeler PA-C Entered By: Worthy Keeler on 05/01/2020 10:22:55 Shawn Hayes ([id number]) -------------------------------------------------------------------------------- Debridement Details Patient Name: Shawn Hayes Date of Service: 05/01/2020 10:15 AM Medical Record Number: [id number] Patient Account Number: [id number] Date of Birth/Sex: Nov 23, 1955 (65 y.o. M) Treating RN: Carlene Coria Primary Care Provider: Frazier Richards Other Clinician: Jeanine Luz Referring Provider: Frazier Richards Treating Provider/Extender: Skipper Cliche in Treatment: 1 Debridement Performed for Wound #3 Left,Lateral Amputation Site - Transmetatarsal Assessment: Performed By: Physician Tommie Sams., PA-C Debridement Type: Debridement Level of Consciousness (Pre- Awake and Alert procedure): Pre-procedure Verification/Time Out Yes - 10:47 Taken: Start Time: 10:47 Pain Control: Lidocaine 5% topical ointment Total Area Debrided (L x W): 1 (cm) x 1.5 (cm) = 1.5 (cm) Tissue and other material Viable, Non-Viable, Slough, Subcutaneous, Skin: Dermis , Skin: Epidermis, Slough debrided: Level: Skin/Subcutaneous Tissue Debridement Description: Excisional Instrument: Curette Bleeding: Moderate Hemostasis Achieved: Pressure End Time: 10:50 Procedural Pain: 0 Post  Procedural Pain: 0 Response to Treatment: Procedure was tolerated well Level of Consciousness (Post- Awake and Alert procedure): Post Debridement Measurements of Total Wound Length: (cm) 1 Width: (cm) 1.5 Depth: (cm) 0.4 Volume: (cm) 0.471 Character of Wound/Ulcer Post Debridement: Improved Post Procedure Diagnosis Same as Pre-procedure Electronic Signature(s) Signed: 05/01/2020 11:30:15 AM By: Worthy Keeler PA-C Signed: 05/06/2020 12:09:52 PM By: Carlene Coria RN Entered By: Carlene Coria on 05/01/2020 10:49:23 Shawn Hayes ([id number]) -------------------------------------------------------------------------------- Debridement Details Patient Name: Shawn Hayes. Date of Service: 05/01/2020 10:15 AM Medical Record Number: [id number] Patient Account Number: [id number] Date of Birth/Sex: February 11, 1956 (65 y.o. M) Treating RN: Carlene Coria Primary Care Provider: Frazier Richards Other Clinician: Jeanine Luz Referring Provider: Frazier Richards Treating Provider/Extender: Skipper Cliche in Treatment: 1 Debridement Performed for Wound #2 Left,Medial Amputation Site - Transmetatarsal Assessment: Performed By: Physician Tommie Sams., PA-C Debridement Type: Debridement Level of Consciousness (Pre- Awake and Alert procedure): Pre-procedure Verification/Time Out Yes - 10:47 Taken: Start Time: 10:47 Pain Control: Lidocaine 5% topical ointment Total Area Debrided (L x W): 0.5 (cm) x 2.4 (cm) = 1.2 (cm) Tissue and other material Viable, Non-Viable, Slough, Subcutaneous, Skin: Dermis , Skin: Epidermis, Slough debrided: Level: Skin/Subcutaneous Tissue Debridement Description: Excisional Instrument: Curette Bleeding: Moderate Hemostasis Achieved: Pressure End Time: 10:50 Procedural Pain: 0 Post Procedural Pain: 0 Response to Treatment: Procedure was tolerated well Level of Consciousness (Post- Awake and Alert procedure): Post Debridement Measurements of Total  Wound Length: (cm) 0.5 Width: (cm) 2.4 Depth: (cm) 0.4 Volume: (cm) 0.377 Character of Wound/Ulcer Post Debridement: Improved Post Procedure Diagnosis Same as Pre-procedure Electronic Signature(s) Signed: 05/01/2020 11:30:15 AM By: Worthy Keeler PA-C Signed: 05/06/2020 12:09:52 PM By: Carlene Coria RN Entered By: Carlene Coria on 05/01/2020 10:49:55 Shawn Hayes ([id number]) -------------------------------------------------------------------------------- Debridement Details Patient Name: Shawn Hayes. Date of Service: 05/01/2020 10:15 AM Medical Record Number: [id number] Patient Account Number: [id number] Date of Birth/Sex: [birth year] (65 y.o. M) Treating RN: Carlene Coria Primary Care Provider: Frazier Richards Other Clinician: Jeanine Luz Referring Provider: Frazier Richards Treating Provider/Extender:  Jeri Cos Weeks in Treatment: 1 Debridement Performed for Wound #1 Right Amputation Site - Below Knee Assessment: Performed By: Physician Tommie Sams., PA-C Debridement Type: Debridement Level of Consciousness (Pre- Awake and Alert procedure): Pre-procedure Verification/Time Out Yes - 10:47 Taken: Start Time: 10:47 Pain Control: Lidocaine 5% topical ointment Total Area Debrided (L x W): 0.2 (cm) x 1 (cm) = 0.2 (cm) Tissue and other material Viable, Non-Viable, Slough, Subcutaneous, Skin: Dermis , Skin: Epidermis, Slough debrided: Level: Skin/Subcutaneous Tissue Debridement Description: Excisional Instrument: Curette Bleeding: Moderate Hemostasis Achieved: Pressure End Time: 10:50 Procedural Pain: 0 Post Procedural Pain: 0 Response to Treatment: Procedure was tolerated well Level of Consciousness (Post- Awake and Alert procedure): Post Debridement Measurements of Total Wound Length: (cm) 0.2 Width: (cm) 1 Depth: (cm) 0.1 Volume: (cm) 0.016 Character of Wound/Ulcer Post Debridement: Improved Post Procedure Diagnosis Same as  Pre-procedure Electronic Signature(s) Signed: 05/01/2020 11:30:15 AM By: Worthy Keeler PA-C Signed: 05/06/2020 12:09:52 PM By: Carlene Coria RN Entered By: Carlene Coria on 05/01/2020 10:54:31 Shawn Hayes ([id number]) -------------------------------------------------------------------------------- HPI Details Patient Name: Shawn Hayes. Date of Service: 05/01/2020 10:15 AM Medical Record Number: [id number] Patient Account Number: [id number] Date of Birth/Sex: 06-Oct-1955 (65 y.o. M) Treating RN: Carlene Coria Primary Care Provider: Frazier Richards Other Clinician: Jeanine Luz Referring Provider: Frazier Richards Treating Provider/Extender: Skipper Cliche in Treatment: 1 History of Present Illness HPI Description: 04/24/2020 upon evaluation today patient presents for initial evaluation here in our clinic concerning issues that he has been having with wound dehiscence in regard to his below-knee amputation on the right as well as transmetatarsal amputation on the left. Fortunately this does not appear to be doing too badly which is good news. There is no signs of active infection at this time. I do think he is going require little bit of sharp debridement here but fortunately overall I think he is actually managing quite nicely in a bad situation. The initial below-knee amputation was in January on the right. His transmetatarsal amputation was in November 2021. The patient states that obviously his lifestyle has changed dramatically over the past several months. He seems to be in fairly good spirits all things considered. He does have a history of diabetes mellitus type 2. 05/01/2020 upon evaluation today patient appears to be doing excellent in regard to his wounds. There again will require some sharp debridement to clear away some of the slough today but overall I feel like he is making good progress. Fortunately there does not appear to be any evidence of active infection which  is great news. No fevers, chills, nausea, vomiting, or diarrhea. Electronic Signature(s) Signed: 05/01/2020 10:59:04 AM By: Worthy Keeler PA-C Entered By: Worthy Keeler on 05/01/2020 10:59:04 Shawn Hayes ([id number]) -------------------------------------------------------------------------------- Physical Exam Details Patient Name: Shawn Hayes Date of Service: 05/01/2020 10:15 AM Medical Record Number: [id number] Patient Account Number: [id number] Date of Birth/Sex: [birth year] (65 y.o. M) Treating RN: Carlene Coria Primary Care Provider: Frazier Richards Other Clinician: Jeanine Luz Referring Provider: Frazier Richards Treating Provider/Extender: Jeri Cos Weeks in Treatment: 1 Constitutional Well-nourished and well-hydrated in no acute distress. Respiratory normal breathing without difficulty. Psychiatric this patient is able to make decisions and demonstrates good insight into disease process. Alert and Oriented x 3. pleasant and cooperative. Notes Patient's wounds did not require sharp debridement to clear away some of the slough and biofilm from the surface of the wound he tolerated this today without complication post debridement the wound bed appears to be  doing much better which is great news. No fevers, chills, nausea, vomiting, or diarrhea. Electronic Signature(s) Signed: 05/01/2020 10:59:21 AM By: Worthy Keeler PA-C Entered By: Worthy Keeler on 05/01/2020 10:59:21 Shawn Hayes ([id number]) -------------------------------------------------------------------------------- Physician Orders Details Patient Name: Shawn Hayes Date of Service: 05/01/2020 10:15 AM Medical Record Number: [id number] Patient Account Number: [id number] Date of Birth/Sex: January 07, 1956 (65 y.o. M) Treating RN: Carlene Coria Primary Care Provider: Frazier Richards Other Clinician: Jeanine Luz Referring Provider: Frazier Richards Treating Provider/Extender: Skipper Cliche in Treatment: 1 Verbal / Phone Orders: No Diagnosis Coding ICD-10 Coding Code Description E11.621 Type 2 diabetes mellitus with foot ulcer Z89.421 Acquired absence of other right toe(s) L97.522 Non-pressure chronic ulcer of other part of left foot with fat layer exposed Z89.511 Acquired absence of right leg below knee L97.812 Non-pressure chronic ulcer of other part of right lower leg with fat layer exposed I10 Essential (primary) hypertension I50.42 Chronic combined systolic (congestive) and diastolic (congestive) heart failure J44.9 Chronic obstructive pulmonary disease, unspecified Follow-up Appointments Wound #1 Right Amputation Site - Below Knee o Return Appointment in 1 week. Wound #2 Left,Medial Amputation Site - Transmetatarsal o Return Appointment in 1 week. Wound #3 Left,Lateral Amputation Site - Transmetatarsal o Return Appointment in 1 week. Bathing/ Shower/ Hygiene Wound #1 Right Amputation Site - Below Knee o Clean wound with Normal Saline or wound cleanser. Wound #2 Left,Medial Amputation Site - Transmetatarsal o Clean wound with Normal Saline or wound cleanser. Wound #3 Left,Lateral Amputation Site - Transmetatarsal o Clean wound with Normal Saline or wound cleanser. Wound Treatment Wound #1 - Amputation Site - Below Knee Wound Laterality: Right Cleanser: Byram Ancillary Kit - 15 Day Supply (Generic) 3 x Per Week/30 Days Discharge Instructions: Use supplies as instructed; Kit contains: (15) Saline Bullets; (15) 3x3 Gauze; 15 pr Gloves Cleanser: Normal Saline (Generic) 3 x Per Week/30 Days Discharge Instructions: Wash your hands with soap and water. Remove old dressing, discard into plastic bag and place into trash. Cleanse the wound with Normal Saline prior to applying a clean dressing using gauze sponges, not tissues or cotton balls. Do not scrub or use excessive force. Pat dry using gauze sponges, not tissue or cotton balls. Primary  Dressing: Prisma 4.34 (in) (Generic) 3 x Per Week/30 Days Discharge Instructions: Moisten w/normal saline or sterile water; Cover wound as directed. Do not remove from wound bed. Secondary Dressing: Mepilex Border Flex, 4x4 (in/in) (Generic) 3 x Per Week/30 Days Discharge Instructions: Apply to wound as directed. Do not cut. Wound #2 - Amputation Site - Transmetatarsal Wound Laterality: Left, Medial Cleanser: Byram Ancillary Kit - 15 Day Supply 3 x Per Week/30 Days Discharge Instructions: Use supplies as instructed; Kit contains: (15) Saline Bullets; (15) 3x3 Gauze; 15 pr Gloves Cleanser: Normal Saline (Generic) 3 x Per Week/30 Days Shawn Hayes, Shawn Hayes ([id number]) Discharge Instructions: Wash your hands with soap and water. Remove old dressing, discard into plastic bag and place into trash. Cleanse the wound with Normal Saline prior to applying a clean dressing using gauze sponges, not tissues or cotton balls. Do not scrub or use excessive force. Pat dry using gauze sponges, not tissue or cotton balls. Primary Dressing: Prisma 4.34 (in) (Generic) 3 x Per Week/30 Days Discharge Instructions: do not moisten apply directly to wound bed Secondary Dressing: Mepilex Border Flex, 4x4 (in/in) (Generic) 3 x Per Week/30 Days Discharge Instructions: Apply to wound as directed. Do not cut. Wound #3 - Amputation Site - Transmetatarsal Wound Laterality: Left, Lateral Cleanser:  Byram Ancillary Kit - 15 Day Supply 3 x Per Week/30 Days Discharge Instructions: Use supplies as instructed; Kit contains: (15) Saline Bullets; (15) 3x3 Gauze; 15 pr Gloves Cleanser: Normal Saline (Generic) 3 x Per Week/30 Days Discharge Instructions: Wash your hands with soap and water. Remove old dressing, discard into plastic bag and place into trash. Cleanse the wound with Normal Saline prior to applying a clean dressing using gauze sponges, not tissues or cotton balls. Do not scrub or use excessive force. Pat dry using gauze  sponges, not tissue or cotton balls. Primary Dressing: Prisma 4.34 (in) (Generic) 3 x Per Week/30 Days Discharge Instructions: Moisten w/normal saline or sterile water; Cover wound as directed. Do not remove from wound bed. Secondary Dressing: Mepilex Border Flex, 4x4 (in/in) (Generic) 3 x Per Week/30 Days Discharge Instructions: Apply to wound as directed. Do not cut. Electronic Signature(s) Signed: 05/01/2020 11:30:15 AM By: Worthy Keeler PA-C Signed: 05/06/2020 12:09:52 PM By: Carlene Coria RN Entered By: Carlene Coria on 05/01/2020 10:51:30 Shawn Hayes ([id number]) -------------------------------------------------------------------------------- Problem List Details Patient Name: Shawn Hayes, Shawn Hayes. Date of Service: 05/01/2020 10:15 AM Medical Record Number: [id number] Patient Account Number: [id number] Date of Birth/Sex: August 18, 1955 (65 y.o. M) Treating RN: Carlene Coria Primary Care Provider: Frazier Richards Other Clinician: Jeanine Luz Referring Provider: Frazier Richards Treating Provider/Extender: Skipper Cliche in Treatment: 1 Active Problems ICD-10 Encounter Code Description Active Date MDM Diagnosis E11.621 Type 2 diabetes mellitus with foot ulcer 04/24/2020 No Yes Z89.421 Acquired absence of other right toe(s) 04/24/2020 No Yes L97.522 Non-pressure chronic ulcer of other part of left foot with fat layer 04/24/2020 No Yes exposed Z89.511 Acquired absence of right leg below knee 04/24/2020 No Yes L97.812 Non-pressure chronic ulcer of other part of right lower leg with fat layer 04/24/2020 No Yes exposed [place] (primary) hypertension 04/24/2020 No Yes I50.42 Chronic combined systolic (congestive) and diastolic (congestive) heart 04/24/2020 No Yes failure J44.9 Chronic obstructive pulmonary disease, unspecified 04/24/2020 No Yes Inactive Problems Resolved Problems Electronic Signature(s) Signed: 05/01/2020 10:22:50 AM By: Worthy Keeler PA-C Entered By: Worthy Keeler on 05/01/2020 10:22:49 Shawn Hayes ([id number]) -------------------------------------------------------------------------------- Progress Note Details Patient Name: Shawn Hayes. Date of Service: 05/01/2020 10:15 AM Medical Record Number: [id number] Patient Account Number: [id number] Date of Birth/Sex: 08/05/1955 (65 y.o. M) Treating RN: Carlene Coria Primary Care Provider: Frazier Richards Other Clinician: Jeanine Luz Referring Provider: Frazier Richards Treating Provider/Extender: Skipper Cliche in Treatment: 1 Subjective Chief Complaint Information obtained from Patient Right BKA and left foot ulcers History of Present Illness (HPI) 04/24/2020 upon evaluation today patient presents for initial evaluation here in our clinic concerning issues that he has been having with wound dehiscence in regard to his below-knee amputation on the right as well as transmetatarsal amputation on the left. Fortunately this does not appear to be doing too badly which is good news. There is no signs of active infection at this time. I do think he is going require little bit of sharp debridement here but fortunately overall I think he is actually managing quite nicely in a bad situation. The initial below-knee amputation was in January on the right. His transmetatarsal amputation was in November 2021. The patient states that obviously his lifestyle has changed dramatically over the past several months. He seems to be in fairly good spirits all things considered. He does have a history of diabetes mellitus type 2. 05/01/2020 upon evaluation today patient appears to be doing excellent in regard to his wounds.  There again will require some sharp debridement to clear away some of the slough today but overall I feel like he is making good progress. Fortunately there does not appear to be any evidence of active infection which is great news. No fevers, chills, nausea, vomiting, or  diarrhea. Objective Constitutional Well-nourished and well-hydrated in no acute distress. Vitals Time Taken: 10:22 AM, Temperature: 97.9 F, Pulse: 78 bpm, Respiratory Rate: 18 breaths/min, Blood Pressure: 187/119 mmHg. Respiratory normal breathing without difficulty. Psychiatric this patient is able to make decisions and demonstrates good insight into disease process. Alert and Oriented x 3. pleasant and cooperative. General Notes: Patient's wounds did not require sharp debridement to clear away some of the slough and biofilm from the surface of the wound he tolerated this today without complication post debridement the wound bed appears to be doing much better which is great news. No fevers, chills, nausea, vomiting, or diarrhea. Integumentary (Hair, Skin) Wound #1 status is Open. Original cause of wound was Surgical Injury. The date acquired was: 02/27/2020. The wound has been in treatment 1 weeks. The wound is located on the Right Amputation Site - Below Knee. The wound measures 0.2cm length x 1cm width x 0.1cm depth; 0.157cm^2 area and 0.016cm^3 volume. There is Fat Layer (Subcutaneous Tissue) exposed. There is no tunneling or undermining noted. There is a medium amount of serous drainage noted. There is small (1-33%) granulation within the wound bed. There is a small (1-33%) amount of necrotic tissue within the wound bed including Adherent Slough. Wound #2 status is Open. Original cause of wound was Surgical Injury. The date acquired was: 01/28/2020. The wound has been in treatment 1 weeks. The wound is located on the Left,Medial Amputation Site - Transmetatarsal. The wound measures 0.5cm length x 2.4cm width x 0.4cm depth; 0.942cm^2 area and 0.377cm^3 volume. Wound #3 status is Open. Original cause of wound was Surgical Injury. The date acquired was: 01/28/2020. The wound has been in treatment 1 weeks. The wound is located on the Left,Lateral Amputation Site - Transmetatarsal. The wound  measures 1cm length x 1.5cm width x 0.4cm depth; 1.178cm^2 area and 0.471cm^3 volume. Shawn Hayes, Shawn Hayes ([id number]) Assessment Active Problems ICD-10 Type 2 diabetes mellitus with foot ulcer Acquired absence of other right toe(s) Non-pressure chronic ulcer of other part of left foot with fat layer exposed Acquired absence of right leg below knee Non-pressure chronic ulcer of other part of right lower leg with fat layer exposed Essential (primary) hypertension Chronic combined systolic (congestive) and diastolic (congestive) heart failure Chronic obstructive pulmonary disease, unspecified Procedures Wound #1 Pre-procedure diagnosis of Wound #1 is a Dehisced Wound located on the Right Amputation Site - Below Knee . There was a Excisional Skin/Subcutaneous Tissue Debridement with a total area of 0.2 sq cm performed by Tommie Sams., PA-C. With the following instrument(s): Curette to remove Viable and Non-Viable tissue/material. Material removed includes Subcutaneous Tissue, Slough, Skin: Dermis, and Skin: Epidermis after achieving pain control using Lidocaine 5% topical ointment. No specimens were taken. A time out was conducted at 10:47, prior to the start of the procedure. A Moderate amount of bleeding was controlled with Pressure. The procedure was tolerated well with a pain level of 0 throughout and a pain level of 0 following the procedure. Post Debridement Measurements: 0.2cm length x 1cm width x 0.1cm depth; 0.016cm^3 volume. Character of Wound/Ulcer Post Debridement is improved. Post procedure Diagnosis Wound #1: Same as Pre-Procedure Wound #2 Pre-procedure diagnosis of Wound #2 is a Dehisced Wound located  on the Left,Medial Amputation Site - Transmetatarsal . There was a Excisional Skin/Subcutaneous Tissue Debridement with a total area of 1.2 sq cm performed by Tommie Sams., PA-C. With the following instrument(s): Curette to remove Viable and Non-Viable tissue/material. Material  removed includes Subcutaneous Tissue, Slough, Skin: Dermis, and Skin: Epidermis after achieving pain control using Lidocaine 5% topical ointment. No specimens were taken. A time out was conducted at 10:47, prior to the start of the procedure. A Moderate amount of bleeding was controlled with Pressure. The procedure was tolerated well with a pain level of 0 throughout and a pain level of 0 following the procedure. Post Debridement Measurements: 0.5cm length x 2.4cm width x 0.4cm depth; 0.377cm^3 volume. Character of Wound/Ulcer Post Debridement is improved. Post procedure Diagnosis Wound #2: Same as Pre-Procedure Wound #3 Pre-procedure diagnosis of Wound #3 is a Dehisced Wound located on the Left,Lateral Amputation Site - Transmetatarsal . There was a Excisional Skin/Subcutaneous Tissue Debridement with a total area of 1.5 sq cm performed by Tommie Sams., PA-C. With the following instrument(s): Curette to remove Viable and Non-Viable tissue/material. Material removed includes Subcutaneous Tissue, Slough, Skin: Dermis, and Skin: Epidermis after achieving pain control using Lidocaine 5% topical ointment. No specimens were taken. A time out was conducted at 10:47, prior to the start of the procedure. A Moderate amount of bleeding was controlled with Pressure. The procedure was tolerated well with a pain level of 0 throughout and a pain level of 0 following the procedure. Post Debridement Measurements: 1cm length x 1.5cm width x 0.4cm depth; 0.471cm^3 volume. Character of Wound/Ulcer Post Debridement is improved. Post procedure Diagnosis Wound #3: Same as Pre-Procedure Plan Follow-up Appointments: Wound #1 Right Amputation Site - Below Knee: Return Appointment in 1 week. Wound #2 Left,Medial Amputation Site - Transmetatarsal: Return Appointment in 1 week. Wound #3 Left,Lateral Amputation Site - Transmetatarsal: Return Appointment in 1 week. Bathing/ Shower/ Hygiene: Wound #1 Right Amputation  Site - Below Knee: Clean wound with Normal Saline or wound cleanser. Wound #2 Left,Medial Amputation Site - Transmetatarsal: Clean wound with Normal Saline or wound cleanser. Wound #3 Left,Lateral Amputation Site - Transmetatarsal: Shawn Hayes, Shawn Hayes. ([id number]) Clean wound with Normal Saline or wound cleanser. WOUND #1: - Amputation Site - Below Knee Wound Laterality: Right Cleanser: Byram Ancillary Kit - 15 Day Supply (Generic) 3 x Per Week/30 Days Discharge Instructions: Use supplies as instructed; Kit contains: (15) Saline Bullets; (15) 3x3 Gauze; 15 pr Gloves Cleanser: Normal Saline (Generic) 3 x Per Week/30 Days Discharge Instructions: Wash your hands with soap and water. Remove old dressing, discard into plastic bag and place into trash. Cleanse the wound with Normal Saline prior to applying a clean dressing using gauze sponges, not tissues or cotton balls. Do not scrub or use excessive force. Pat dry using gauze sponges, not tissue or cotton balls. Primary Dressing: Prisma 4.34 (in) (Generic) 3 x Per Week/30 Days Discharge Instructions: Moisten w/normal saline or sterile water; Cover wound as directed. Do not remove from wound bed. Secondary Dressing: Mepilex Border Flex, 4x4 (in/in) (Generic) 3 x Per Week/30 Days Discharge Instructions: Apply to wound as directed. Do not cut. WOUND #2: - Amputation Site - Transmetatarsal Wound Laterality: Left, Medial Cleanser: Byram Ancillary Kit - 15 Day Supply 3 x Per Week/30 Days Discharge Instructions: Use supplies as instructed; Kit contains: (15) Saline Bullets; (15) 3x3 Gauze; 15 pr Gloves Cleanser: Normal Saline (Generic) 3 x Per Week/30 Days Discharge Instructions: Wash your hands with soap and water. Remove old  dressing, discard into plastic bag and place into trash. Cleanse the wound with Normal Saline prior to applying a clean dressing using gauze sponges, not tissues or cotton balls. Do not scrub or use excessive force. Pat dry using  gauze sponges, not tissue or cotton balls. Primary Dressing: Prisma 4.34 (in) (Generic) 3 x Per Week/30 Days Discharge Instructions: do not moisten apply directly to wound bed Secondary Dressing: Mepilex Border Flex, 4x4 (in/in) (Generic) 3 x Per Week/30 Days Discharge Instructions: Apply to wound as directed. Do not cut. WOUND #3: - Amputation Site - Transmetatarsal Wound Laterality: Left, Lateral Cleanser: Byram Ancillary Kit - 15 Day Supply 3 x Per Week/30 Days Discharge Instructions: Use supplies as instructed; Kit contains: (15) Saline Bullets; (15) 3x3 Gauze; 15 pr Gloves Cleanser: Normal Saline (Generic) 3 x Per Week/30 Days Discharge Instructions: Wash your hands with soap and water. Remove old dressing, discard into plastic bag and place into trash. Cleanse the wound with Normal Saline prior to applying a clean dressing using gauze sponges, not tissues or cotton balls. Do not scrub or use excessive force. Pat dry using gauze sponges, not tissue or cotton balls. Primary Dressing: Prisma 4.34 (in) (Generic) 3 x Per Week/30 Days Discharge Instructions: Moisten w/normal saline or sterile water; Cover wound as directed. Do not remove from wound bed. Secondary Dressing: Mepilex Border Flex, 4x4 (in/in) (Generic) 3 x Per Week/30 Days Discharge Instructions: Apply to wound as directed. Do not cut. 1. I would recommend currently that we go ahead and continue with the wound care measures as before and the patient is in agreement with the plan. This includes the use of the silver collagen dressing. We will moisten it and put it on all the wounds except for the left medial foot wound where because of how much moisture is here we will get a put and dry. 2. I am also can recommend that we have the patient go ahead and continue with his appropriate postop shoe on the left foot. I am also can recommend an Ace wrap for the right lower extremity/amputation site to help with some of the edema and swelling  he tells me this did well in the past for him. We will see patient back for reevaluation in 1 week here in the clinic. If anything worsens or changes patient will contact our office for additional recommendations. Electronic Signature(s) Signed: 05/01/2020 11:00:02 AM By: Worthy Keeler PA-C Entered By: Worthy Keeler on 05/01/2020 11:00:02 Shawn Hayes ([id number]) -------------------------------------------------------------------------------- SuperBill Details Patient Name: Shawn Hayes Date of Service: 05/01/2020 Medical Record Number: [id number] Patient Account Number: [id number] Date of Birth/Sex: [birth year] (65 y.o. 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 L97.812 Non-pressure chronic ulcer of other part of right lower leg with fat layer exposed I10 Essential (primary) hypertension I50.42 Chronic combined systolic (congestive) and diastolic (congestive) heart failure J44.9 Chronic obstructive pulmonary disease, unspecified Facility Procedures CPT4 Code: 32992426 Description: 11042 - DEB SUBQ TISSUE 20 SQ CM/< Modifier: Quantity: 1 CPT4 Code: Description: ICD-10 Diagnosis Description L97.522 Non-pressure chronic ulcer of other part of left foot with fat layer exp L97.812 Non-pressure chronic ulcer of  other part of right lower leg with fat lay Modifier: osed er exposed Quantity: Physician Procedures CPT4 Code: 3225672 Description: 11042 - WC PHYS SUBQ TISS 20 SQ CM Modifier: Quantity: 1 CPT4 Code: Description: ICD-10 Diagnosis  Description L97.522 Non-pressure chronic ulcer of other part of left foot with fat layer exp L97.812 Non-pressure chronic ulcer of other part of right lower leg with fat lay Modifier: osed er exposed Quantity: Electronic Signature(s) Signed: 05/01/2020 11:00:38 AM By: Worthy Keeler PA-C Entered By: Worthy Keeler on 05/01/2020 11:00:38

## 2020-05-06 NOTE — Progress Notes (Signed)
Shawn Hayes, Shawn Hayes ([id number]) Visit Report for 05/01/2020 Arrival Information Details Patient Name: Shawn Hayes, Shawn Hayes. Date of Service: 05/01/2020 10:15 AM Medical Record Number: [id number] Patient Account Number: [id number] Date of Birth/Sex: 07/25/1955 (65 y.o. M) Treating RN: Carlene Coria Primary Care Kendell Gammon: Frazier Richards Other Clinician: Jeanine Luz Referring Eurika Sandy: Frazier Richards Treating Myldred Raju/Extender: Skipper Cliche in Treatment: 1 Visit Information History Since Last Visit Added or deleted any medications: No Patient Arrived: Ambulatory Had a fall or experienced change in No Arrival Time: 10:22 activities of daily living that may affect Accompanied By: self risk of falls: Transfer Assistance: None Hospitalized since last visit: No Patient Identification Verified: Yes Pain Present Now: No Secondary Verification Process Completed: Yes Patient Has Alerts: Yes Patient Alerts: Type II Diabetic Electronic Signature(s) Signed: 05/04/2020 1:04:35 PM By: Jeanine Luz Entered By: Jeanine Luz on 05/01/2020 10:22:28 Shawn Hayes ([id number]) -------------------------------------------------------------------------------- Clinic Level of Care Assessment Details Patient Name: Shawn Hayes. Date of Service: 05/01/2020 10:15 AM Medical Record Number: [id number] Patient Account Number: [id number] Date of Birth/Sex: [birth year] (65 y.o. M) Treating RN: Carlene Coria Primary Care Zacharius Funari: Frazier Richards Other Clinician: Jeanine Luz Referring Emlyn Maves: Frazier Richards Treating Mikaili Flippin/Extender: Skipper Cliche in Treatment: 1 Clinic Level of Care Assessment Items TOOL 1 Quantity Score '[]'$  - Use when EandM and Procedure is performed on INITIAL visit 0 ASSESSMENTS - Nursing Assessment / Reassessment '[]'$  - General Physical Exam (combine w/ comprehensive assessment (listed just below) when performed on new 0 pt. evals) '[]'$  -  0 Comprehensive Assessment (HX, ROS, Risk Assessments, Wounds Hx, etc.) ASSESSMENTS - Wound and Skin Assessment / Reassessment '[]'$  - Dermatologic / Skin Assessment (not related to wound area) 0 ASSESSMENTS - Ostomy and/or Continence Assessment and Care '[]'$  - Incontinence Assessment and Management 0 '[]'$  - 0 Ostomy Care Assessment and Management (repouching, etc.) PROCESS - Coordination of Care '[]'$  - Simple Patient / Family Education for ongoing care 0 '[]'$  - 0 Complex (extensive) Patient / Family Education for ongoing care '[]'$  - 0 Staff obtains Programmer, systems, Records, Test Results / Process Orders '[]'$  - 0 Staff telephones HHA, Nursing Homes / Clarify orders / etc '[]'$  - 0 Routine Transfer to another Facility (non-emergent condition) '[]'$  - 0 Routine Hospital Admission (non-emergent condition) '[]'$  - 0 New Admissions / Biomedical engineer / Ordering NPWT, Apligraf, etc. '[]'$  - 0 Emergency Hospital Admission (emergent condition) PROCESS - Special Needs '[]'$  - Pediatric / Minor Patient Management 0 '[]'$  - 0 Isolation Patient Management '[]'$  - 0 Hearing / Language / Visual special needs '[]'$  - 0 Assessment of Community assistance (transportation, D/C planning, etc.) '[]'$  - 0 Additional assistance / Altered mentation '[]'$  - 0 Support Surface(s) Assessment (bed, cushion, seat, etc.) INTERVENTIONS - Miscellaneous '[]'$  - External ear exam 0 '[]'$  - 0 Patient Transfer (multiple staff / Civil Service fast streamer / Similar devices) '[]'$  - 0 Simple Staple / Suture removal (25 or less) '[]'$  - 0 Complex Staple / Suture removal (26 or more) '[]'$  - 0 Hypo/Hyperglycemic Management (do not check if billed separately) '[]'$  - 0 Ankle / Brachial Index (ABI) - do not check if billed separately Has the patient been seen at the hospital within the last three years: Yes Total Score: 0 Level Of Care: ____ Shawn Hayes ([id number]) Electronic Signature(s) Signed: 05/06/2020 12:09:52 PM By: Carlene Coria RN Entered By: Carlene Coria on  05/01/2020 10:54:43 Shawn Hayes ([id number]) -------------------------------------------------------------------------------- Lower Extremity Assessment Details Patient Name: Shawn Hayes. Date of Service: 05/01/2020 10:15 AM Medical Record Number:  [id number] Patient Account Number: [id number] Date of Birth/Sex: May 11, 1955 (65 y.o. M) Treating RN: Carlene Coria Primary Care Savior Himebaugh: Frazier Richards Other Clinician: Jeanine Luz Referring Alia Parsley: Frazier Richards Treating Keyunna Coco/Extender: Jeri Cos Weeks in Treatment: 1 Edema Assessment Assessed: [Left: Yes] [Right: No] [Left: Edema] [Right: :] Calf Left: Right: Point of Measurement: 30 cm From Medial Instep 34.5 cm Ankle Left: Right: Point of Measurement: 12 cm From Medial Instep 25.2 cm Vascular Assessment Pulses: Dorsalis Pedis Palpable: [Left:Yes] Electronic Signature(s) Signed: 05/01/2020 11:15:47 AM By: Jeanine Luz Signed: 05/06/2020 12:09:52 PM By: Carlene Coria RN Entered By: Jeanine Luz on 05/01/2020 11:15:47 Shawn Hayes ([id number]) -------------------------------------------------------------------------------- Multi Wound Chart Details Patient Name: Shawn Hayes. Date of Service: 05/01/2020 10:15 AM Medical Record Number: [id number] Patient Account Number: [id number] Date of Birth/Sex: January 01, 1956 (65 y.o. M) Treating RN: Carlene Coria Primary Care Tierre Netto: Frazier Richards Other Clinician: Jeanine Luz Referring Bradden Tadros: Frazier Richards Treating Tallan Sandoz/Extender: Skipper Cliche in Treatment: 1 Vital Signs Height(in): Pulse(bpm): 16 Weight(lbs): Blood Pressure(mmHg): 187/119 Body Mass Index(BMI): Temperature(F): 97.9 Respiratory Rate(breaths/min): 18 Photos: [1:No Photos] [2:No Photos] [3:No Photos] Wound Location: [1:Right Amputation Site - Below Knee Left, Medial Amputation Site -] [2:Transmetatarsal] [3:Left, Lateral Amputation Site -  Transmetatarsal] Wounding Event: [1:Surgical Injury] [2:Surgical Injury] [3:Surgical Injury] Primary Etiology: [1:Dehisced Wound] [2:Dehisced Wound] [3:Dehisced Wound] Comorbid History: [1:Congestive Heart Failure, Type II N/A Diabetes] [3:N/A] Date Acquired: [1:02/27/2020] [2:01/28/2020] [3:01/28/2020] Weeks of Treatment: [1:1] [2:1] [3:1] Wound Status: [1:Open] [2:Open] [3:Open] Measurements L x W x D (cm) [1:0.2x1x0.1] [2:0.5x2.4x0.4] [3:1x1.5x0.4] Area (cm) : [1:0.157] [2:0.942] [3:1.178] Volume (cm) : [1:0.016] [2:0.377] [3:0.471] % Reduction in Area: [1:80.00%] [2:11.10%] [3:16.70%] % Reduction in Volume: [1:79.70%] [2:11.10%] [3:16.60%] Classification: [1:Full Thickness Without Exposed Support Structures] [2:Full Thickness Without Exposed Support Structures] [3:Full Thickness Without Exposed Support Structures] Exudate Amount: [1:Medium] [2:N/A] [3:N/A] Exudate Type: [1:Serous] [2:N/A] [3:N/A] Exudate Color: [1:amber] [2:N/A] [3:N/A] Granulation Amount: [1:Small (1-33%)] [2:N/A] [3:N/A] Necrotic Amount: [1:Small (1-33%)] [2:N/A] [3:N/A] Exposed Structures: [1:Fat Layer (Subcutaneous Tissue): N/A Yes Fascia: No Tendon: No Muscle: No Joint: No Bone: No] [3:N/A] Treatment Notes Electronic Signature(s) Signed: 05/06/2020 12:09:52 PM By: Carlene Coria RN Entered By: Carlene Coria on 05/01/2020 10:47:46 Shawn Hayes ([id number]) -------------------------------------------------------------------------------- [place] Details Patient Name: Shawn Hayes. Date of Service: 05/01/2020 10:15 AM Medical Record Number: [id number] Patient Account Number: [id number] Date of Birth/Sex: 13-Jun-1955 (65 y.o. M) Treating RN: Carlene Coria Primary Care Lysander Calixte: Frazier Richards Other Clinician: Jeanine Luz Referring Dawana Asper: Frazier Richards Treating Dori Devino/Extender: Skipper Cliche in Treatment: 1 Active Inactive Wound/Skin Impairment Nursing  Diagnoses: Impaired tissue integrity Goals: Patient/caregiver will verbalize understanding of skin care regimen Date Initiated: 04/24/2020 Target Resolution Date: 05/22/2020 Goal Status: Active Ulcer/skin breakdown will have a volume reduction of 30% by week 4 Date Initiated: 04/24/2020 Target Resolution Date: 05/22/2020 Goal Status: Active Ulcer/skin breakdown will have a volume reduction of 50% by week 8 Date Initiated: 04/24/2020 Target Resolution Date: 06/22/2020 Goal Status: Active Ulcer/skin breakdown will have a volume reduction of 80% by week 12 Date Initiated: 04/24/2020 Target Resolution Date: 07/22/2020 Goal Status: Active Interventions: Assess patient/caregiver ability to obtain necessary supplies Assess patient/caregiver ability to perform ulcer/skin care regimen upon admission and as needed Assess ulceration(s) every visit Provide education on ulcer and skin care Treatment Activities: Skin care regimen initiated : 04/24/2020 Notes: Electronic Signature(s) Signed: 05/06/2020 12:09:52 PM By: Carlene Coria RN Entered By: Carlene Coria on 05/01/2020 10:47:17 Shawn Hayes ([id number]) -------------------------------------------------------------------------------- Pain Assessment Details Patient Name: Shawn Hayes. Date of  Service: 05/01/2020 10:15 AM Medical Record Number: [id number] Patient Account Number: [id number] Date of Birth/Sex: 1955/06/18 (64 y.o. M) Treating RN: Carlene Coria Primary Care Genesis Paget: Frazier Richards Other Clinician: Jeanine Luz Referring Dasia Guerrier: Frazier Richards Treating Zuzu Befort/Extender: Skipper Cliche in Treatment: 1 Active Problems Location of Pain Severity and Description of Pain Patient Has Paino No Site Locations Pain Management and Medication Current Pain Management: Electronic Signature(s) Signed: 05/04/2020 1:04:35 PM By: Jeanine Luz Signed: 05/06/2020 12:09:52 PM By: Carlene Coria RN Entered By: Jeanine Luz on  05/01/2020 10:24:28 Shawn Hayes ([id number]) -------------------------------------------------------------------------------- Patient/Caregiver Education Details Patient Name: Shawn Hayes, Shawn Hayes. Date of Service: 05/01/2020 10:15 AM Medical Record Number: [id number] Patient Account Number: [id number] Date of Birth/Gender: [birth year] (65 y.o. M) Treating RN: Carlene Coria Primary Care Physician: Frazier Richards Other Clinician: Jeanine Luz Referring Physician: Frazier Richards Treating Physician/Extender: Skipper Cliche in Treatment: 1 Education Assessment Education Provided To: Patient Education Topics Provided Wound/Skin Impairment: Methods: Explain/Verbal Responses: State content correctly Electronic Signature(s) Signed: 05/06/2020 12:09:52 PM By: Carlene Coria RN Entered By: Carlene Coria on 05/01/2020 10:55:01 Shawn Hayes ([id number]) -------------------------------------------------------------------------------- Wound Assessment Details Patient Name: Shawn Hayes. Date of Service: 05/01/2020 10:15 AM Medical Record Number: [id number] Patient Account Number: [id number] Date of Birth/Sex: 1955/04/03 (65 y.o. M) Treating RN: Carlene Coria Primary Care Felma Pfefferle: Frazier Richards Other Clinician: Jeanine Luz Referring Marilyn Wing: Frazier Richards Treating Armeda Plumb/Extender: Jeri Cos Weeks in Treatment: 1 Wound Status Wound Number: 1 Primary Etiology: Dehisced Wound Wound Location: Right Amputation Site - Below Knee Wound Status: Open Wounding Event: Surgical Injury Comorbid History: Congestive Heart Failure, Type II Diabetes Date Acquired: 02/27/2020 Weeks Of Treatment: 1 Clustered Wound: No Photos Photo Uploaded By: Jeanine Luz on 05/01/2020 11:59:11 Wound Measurements Length: (cm) 0.2 Width: (cm) 1 Depth: (cm) 0.1 Area: (cm) 0.157 Volume: (cm) 0.016 % Reduction in Area: 80% % Reduction in Volume: 79.7% Tunneling:  No Undermining: No Wound Description Classification: Full Thickness Without Exposed Support Structu Exudate Amount: Medium Exudate Type: Serous Exudate Color: amber res Foul Odor After Cleansing: No Slough/Fibrino Yes Wound Bed Granulation Amount: Small (1-33%) Exposed Structure Necrotic Amount: Small (1-33%) Fascia Exposed: No Necrotic Quality: Adherent Slough Fat Layer (Subcutaneous Tissue) Exposed: Yes Tendon Exposed: No Muscle Exposed: No Joint Exposed: No Bone Exposed: No Electronic Signature(s) Signed: 05/01/2020 10:43:33 AM By: Jeanine Luz Signed: 05/06/2020 12:09:52 PM By: Carlene Coria RN Entered By: Jeanine Luz on 05/01/2020 10:43:33 Shawn Hayes ([id number]) -------------------------------------------------------------------------------- Wound Assessment Details Patient Name: Shawn Hayes. Date of Service: 05/01/2020 10:15 AM Medical Record Number: [id number] Patient Account Number: [id number] Date of Birth/Sex: 12/27/1955 (65 y.o. M) Treating RN: Carlene Coria Primary Care Jasiah Elsen: Frazier Richards Other Clinician: Jeanine Luz Referring Kiyan Burmester: Frazier Richards Treating Zaiden Ludlum/Extender: Jeri Cos Weeks in Treatment: 1 Wound Status Wound Number: 2 Primary Etiology: Dehisced Wound Wound Location: Left, Medial Amputation Site - Transmetatarsal Wound Status: Open Wounding Event: Surgical Injury Date Acquired: 01/28/2020 Weeks Of Treatment: 1 Clustered Wound: No Photos Photo Uploaded By: Jeanine Luz on 05/01/2020 11:59:12 Wound Measurements Length: (cm) 0.5 Width: (cm) 2.4 Depth: (cm) 0.4 Area: (cm) 0.942 Volume: (cm) 0.377 % Reduction in Area: 11.1% % Reduction in Volume: 11.1% Wound Description Classification: Full Thickness Without Exposed Support Structu res Electronic Signature(s) Signed: 05/04/2020 1:04:35 PM By: Jeanine Luz Signed: 05/06/2020 12:09:52 PM By: Carlene Coria RN Entered By: Jeanine Luz on  05/01/2020 10:40:16 Shawn Hayes ([id number]) -------------------------------------------------------------------------------- Wound Assessment Details Patient Name: Shawn Hayes. Date of Service: 05/01/2020 10:15 AM Medical Record Number:  [id number] Patient Account Number: [id number] Date of Birth/Sex: September 19, 1955 (65 y.o. M) Treating RN: Carlene Coria Primary Care Cyenna Rebello: Frazier Richards Other Clinician: Jeanine Luz Referring Glora Hulgan: Frazier Richards Treating Treasure Ingrum/Extender: Jeri Cos Weeks in Treatment: 1 Wound Status Wound Number: 3 Primary Etiology: Dehisced Wound Wound Location: Left, Lateral Amputation Site - Transmetatarsal Wound Status: Open Wounding Event: Surgical Injury Date Acquired: 01/28/2020 Weeks Of Treatment: 1 Clustered Wound: No Photos Photo Uploaded By: Jeanine Luz on 05/01/2020 11:59:32 Wound Measurements Length: (cm) 1 Width: (cm) 1.5 Depth: (cm) 0.4 Area: (cm) 1.178 Volume: (cm) 0.471 % Reduction in Area: 16.7% % Reduction in Volume: 16.6% Wound Description Classification: Full Thickness Without Exposed Support Structu res Electronic Signature(s) Signed: 05/04/2020 1:04:35 PM By: Jeanine Luz Signed: 05/06/2020 12:09:52 PM By: Carlene Coria RN Entered By: Jeanine Luz on 05/01/2020 10:40:16 Shawn Hayes ([id number]) -------------------------------------------------------------------------------- [place] Details Patient Name: Shawn Hayes. Date of Service: 05/01/2020 10:15 AM Medical Record Number: [id number] Patient Account Number: [id number] Date of Birth/Sex: [birth year] (65 y.o. M) Treating RN: Carlene Coria Primary Care Geralda Baumgardner: Frazier Richards Other Clinician: Jeanine Luz Referring Cayle Thunder: Frazier Richards Treating Emilo Gras/Extender: Skipper Cliche in Treatment: 1 Vital Signs Time Taken: 10:22 Temperature (F): 97.9 Pulse (bpm): 78 Respiratory Rate (breaths/min): 18 Blood Pressure  (mmHg): 187/119 Reference Range: 80 - 120 mg / dl Electronic Signature(s) Signed: 05/04/2020 1:04:35 PM By: Jeanine Luz Entered By: Jeanine Luz on 05/01/2020 10:24:18

## 2020-05-07 ENCOUNTER — Encounter: Payer: Medicare HMO | Admitting: Physician Assistant

## 2020-05-07 ENCOUNTER — Other Ambulatory Visit: Payer: Self-pay

## 2020-05-07 DIAGNOSIS — Z89511 Acquired absence of right leg below knee: Secondary | ICD-10-CM | POA: Diagnosis not present

## 2020-05-07 DIAGNOSIS — I110 Hypertensive heart disease with heart failure: Secondary | ICD-10-CM | POA: Diagnosis not present

## 2020-05-07 DIAGNOSIS — T8131XA Disruption of external operation (surgical) wound, not elsewhere classified, initial encounter: Secondary | ICD-10-CM | POA: Diagnosis not present

## 2020-05-07 DIAGNOSIS — I5042 Chronic combined systolic (congestive) and diastolic (congestive) heart failure: Secondary | ICD-10-CM | POA: Diagnosis not present

## 2020-05-07 DIAGNOSIS — L97522 Non-pressure chronic ulcer of other part of left foot with fat layer exposed: Secondary | ICD-10-CM | POA: Diagnosis not present

## 2020-05-07 DIAGNOSIS — L97812 Non-pressure chronic ulcer of other part of right lower leg with fat layer exposed: Secondary | ICD-10-CM | POA: Diagnosis not present

## 2020-05-07 DIAGNOSIS — E11621 Type 2 diabetes mellitus with foot ulcer: Secondary | ICD-10-CM | POA: Diagnosis not present

## 2020-05-07 NOTE — Progress Notes (Addendum)
JAQUA, CHING ([id number]) Visit Report for 05/07/2020 Arrival Information Details Patient Name: Shawn Hayes, Shawn Hayes. Date of Service: 05/07/2020 9:00 AM Medical Record Number: [id number] Patient Account Number: [id number] Date of Birth/Sex: [birth year] (65 y.o. M) Treating RN: Carlene Coria Primary Care Calah Gershman: Frazier Richards Other Clinician: Jeanine Luz Referring Elmo Shumard: Frazier Richards Treating Coby Shrewsberry/Extender: Skipper Cliche in Treatment: 1 Visit Information History Since Last Visit All ordered tests and consults were completed: No Patient Arrived: Wheel Chair Added or deleted any medications: No Arrival Time: 09:15 Any new allergies or adverse reactions: No Accompanied By: self Had a fall or experienced change in No Transfer Assistance: None activities of daily living that may affect Patient Identification Verified: Yes risk of falls: Secondary Verification Process Completed: Yes Signs or symptoms of abuse/neglect since last visito No Patient Requires Transmission-Based Precautions: No Hospitalized since last visit: No Patient Has Alerts: Yes Implantable device outside of the clinic excluding No Patient Alerts: Type II Diabetic cellular tissue based products placed in the center since last visit: Pain Present Now: No Electronic Signature(s) Signed: 05/07/2020 1:29:26 PM By: Carlene Coria RN Entered By: Carlene Coria on 05/07/2020 09:27:29 Shawn Hayes ([id number]) -------------------------------------------------------------------------------- Clinic Level of Care Assessment Details Patient Name: Shawn Hayes. Date of Service: 05/07/2020 9:00 AM Medical Record Number: [id number] Patient Account Number: [id number] Date of Birth/Sex: 08-26-1955 (65 y.o. M) Treating RN: Dolan Amen Primary Care Larry Knipp: Frazier Richards Other Clinician: Jeanine Luz Referring Zaylei Mullane: Frazier Richards Treating Ralphie Lovelady/Extender: Skipper Cliche in  Treatment: 1 Clinic Level of Care Assessment Items TOOL 1 Quantity Score _0  - Use when EandM and Procedure is performed on INITIAL visit 0 ASSESSMENTS - Nursing Assessment / Reassessment _1  - General Physical Exam (combine w/ comprehensive assessment (listed just below) when performed on new 0 pt. evals) _2  - 0 Comprehensive Assessment (HX, ROS, Risk Assessments, Wounds Hx, etc.) ASSESSMENTS - Wound and Skin Assessment / Reassessment _3  - Dermatologic / Skin Assessment (not related to wound area) 0 ASSESSMENTS - Ostomy and/or Continence Assessment and Care _4  - Incontinence Assessment and Management 0 _5  - 0 Ostomy Care Assessment and Management (repouching, etc.) PROCESS - Coordination of Care _6  - Simple Patient / Family Education for ongoing care 0 _7  - 0 Complex (extensive) Patient / Family Education for ongoing care _8  - 0 Staff obtains Programmer, systems, Records, Test Results / Process Orders _9  - 0 Staff telephones HHA, Nursing Homes / Clarify orders / etc _10  - 0 Routine Transfer to another Facility (non-emergent condition) _11  - 0 Routine Hospital Admission (non-emergent condition) _12  - 0 New Admissions / Biomedical engineer / Ordering NPWT, Apligraf, etc. _13  - 0 Emergency Hospital Admission (emergent condition) PROCESS - Special Needs _14  - Pediatric / Minor Patient Management 0 _15  - 0 Isolation Patient Management _16  - 0 Hearing / Language / Visual special needs _17  - 0 Assessment of Community assistance (transportation, D/C planning, etc.) _18  - 0 Additional assistance / Altered mentation _19  - 0 Support Surface(s) Assessment (bed, cushion, seat, etc.) INTERVENTIONS - Miscellaneous _20  - External ear exam 0 _21  - 0 Patient Transfer (multiple staff / Civil Service fast streamer / Similar devices) _22  - 0 Simple Staple / Suture removal (25 or less) _23  - 0 Complex Staple / Suture removal (26 or more) _24  - 0 Hypo/Hyperglycemic Management (do not check if billed separately) _25  -  0 Ankle / Brachial Index (ABI) - do not check if billed separately Has the patient been seen at the hospital within the last three years: Yes Total  Score: 0 Level Of Care: ____ Shawn Hayes ([id number]) Electronic Signature(s) Signed: 05/07/2020 4:13:11 PM By: Georges Mouse, Minus Breeding RN Entered By: Georges Mouse, Minus Breeding on 05/07/2020 09:42:38 Shawn Hayes ([id number]) -------------------------------------------------------------------------------- Encounter Discharge Information Details Patient Name: Shawn Hayes. Date of Service: 05/07/2020 9:00 AM Medical Record Number: [id number] Patient Account Number: [id number] Date of Birth/Sex: 1955-10-25 (65 y.o. M) Treating RN: Dolan Amen Primary Care Erlinda Solinger: Frazier Richards Other Clinician: Jeanine Luz Referring Chalonda Schlatter: Frazier Richards Treating Iceis Knab/Extender: Skipper Cliche in Treatment: 1 Encounter Discharge Information Items Post Procedure Vitals Discharge Condition: Stable Temperature (F): 98.5 Ambulatory Status: Wheelchair Pulse (bpm): 67 Discharge Destination: Home Respiratory Rate (breaths/min): 18 Transportation: Private Auto Blood Pressure (mmHg): 166/81 Accompanied By: self Schedule Follow-up Appointment: Yes Clinical Summary of Care: Electronic Signature(s) Signed: 05/07/2020 4:13:11 PM By: Georges Mouse, Minus Breeding RN Entered By: Georges Mouse, Minus Breeding on 05/07/2020 09:43:45 Shawn Hayes ([id number]) -------------------------------------------------------------------------------- Lower Extremity Assessment Details Patient Name: Shawn Hayes. Date of Service: 05/07/2020 9:00 AM Medical Record Number: [id number] Patient Account Number: [id number] Date of Birth/Sex: Jun 12, 1955 (65 y.o. M) Treating RN: Carlene Coria Primary Care Oniel Meleski: Frazier Richards Other Clinician: Jeanine Luz Referring Lennart Gladish: Frazier Richards Treating Vignesh Willert/Extender: Jeri Cos Weeks in  Treatment: 1 Edema Assessment Assessed: [Left: No] [Right: No] Edema: [Left: Ye] [Right: s] Calf Left: Right: Point of Measurement: 30 cm From Medial Instep 33 cm Ankle Left: Right: Point of Measurement: 12 cm From Medial Instep 24 cm Vascular Assessment Pulses: Dorsalis Pedis Palpable: [Left:Yes] Electronic Signature(s) Signed: 05/07/2020 1:29:26 PM By: Carlene Coria RN Entered By: Carlene Coria on 05/07/2020 09:31:15 Shawn Hayes ([id number]) -------------------------------------------------------------------------------- Multi Wound Chart Details Patient Name: Shawn Hayes. Date of Service: 05/07/2020 9:00 AM Medical Record Number: [id number] Patient Account Number: [id number] Date of Birth/Sex: [birth year] (65 y.o. M) Treating RN: Dolan Amen Primary Care Hagen Bohorquez: Frazier Richards Other Clinician: Jeanine Luz Referring Ashey Tramontana: Frazier Richards Treating Quierra Silverio/Extender: Skipper Cliche in Treatment: 1 Vital Signs Height(in): Pulse(bpm): 53 Weight(lbs): Blood Pressure(mmHg): 166/81 Body Mass Index(BMI): Temperature(F): 98.5 Respiratory Rate(breaths/min): 18 Photos: Wound Location: Right Amputation Site - Below Knee Left, Medial Amputation Site - Left, Lateral Amputation Site - Transmetatarsal Transmetatarsal Wounding Event: Surgical Injury Surgical Injury Surgical Injury Primary Etiology: Dehisced Wound Dehisced Wound Dehisced Wound Comorbid History: Congestive Heart Failure, Type II Congestive Heart Failure, Type II Congestive Heart Failure, Type II Diabetes Diabetes Diabetes Date Acquired: 02/27/2020 01/28/2020 01/28/2020 Weeks of Treatment: _0 Wound Status: Open Open Open Measurements L x W x D (cm) 0.2x1.6x0.1 0.2x1.5x1.1 0.5x2x0.7 Area (cm) : 0.251 0.236 0.785 Volume (cm) : 0.025 0.259 0.55 % Reduction in Area: 68.00% 77.70% 44.50% % Reduction in Volume: 68.40% 38.90% 2.70% Classification: Full Thickness Without Exposed Full  Thickness Without Exposed Full Thickness Without Exposed Support Structures Support Structures Support Structures Exudate Amount: Medium Medium Medium Exudate Type: Serosanguineous Serosanguineous Serosanguineous Exudate Color: red, brown red, brown red, brown Granulation Amount: None Present (0%) Small (1-33%) Small (1-33%) Granulation Quality: N/A Pink Pink Necrotic Amount: Large (67-100%) Large (67-100%) Large (67-100%) Exposed Structures: Fascia: No Fat Layer (Subcutaneous Tissue): Fat Layer (Subcutaneous Tissue): Fat Layer (Subcutaneous Tissue): Yes Yes No Fascia: No Fascia: No Tendon: No Tendon: No Tendon: No Muscle: No Muscle: No Muscle: No Joint: No Joint: No Joint: No Bone: No Bone: No Bone: No Epithelialization: None None None Treatment Notes Electronic Signature(s) Signed: 05/07/2020 4:13:11 PM By: Georges Mouse, Minus Breeding RN Entered By: Georges Mouse, Minus Breeding on 05/07/2020 09:38:29 Shawn Hayes ([id number]) -------------------------------------------------------------------------------- Multi-Disciplinary Care  Plan Details Patient Name: GRAY, DOERING. Date of Service: 05/07/2020 9:00 AM Medical Record Number: [id number] Patient Account Number: [id number] Date of Birth/Sex: 1956/01/16 (65 y.o. M) Treating RN: Dolan Amen Primary Care Everlyn Farabaugh: Frazier Richards Other Clinician: Jeanine Luz Referring Rhayne Chatwin: Frazier Richards Treating Tosh Glaze/Extender: Skipper Cliche in Treatment: 1 Active Inactive Wound/Skin Impairment Nursing Diagnoses: Impaired tissue integrity Goals: Patient/caregiver will verbalize understanding of skin care regimen Date Initiated: 04/24/2020 Date Inactivated: 05/07/2020 Target Resolution Date: 05/22/2020 Goal Status: Met Ulcer/skin breakdown will have a volume reduction of 30% by week 4 Date Initiated: 04/24/2020 Target Resolution Date: 05/22/2020 Goal Status: Active Ulcer/skin breakdown will have a volume  reduction of 50% by week 8 Date Initiated: 04/24/2020 Target Resolution Date: 06/22/2020 Goal Status: Active Ulcer/skin breakdown will have a volume reduction of 80% by week 12 Date Initiated: 04/24/2020 Target Resolution Date: 07/22/2020 Goal Status: Active Interventions: Assess patient/caregiver ability to obtain necessary supplies Assess patient/caregiver ability to perform ulcer/skin care regimen upon admission and as needed Assess ulceration(s) every visit Provide education on ulcer and skin care Treatment Activities: Skin care regimen initiated : 04/24/2020 Notes: Electronic Signature(s) Signed: 05/07/2020 4:13:11 PM By: Georges Mouse, Minus Breeding RN Entered By: Georges Mouse, Minus Breeding on 05/07/2020 09:38:00 Shawn Hayes ([id number]) -------------------------------------------------------------------------------- Pain Assessment Details Patient Name: Shawn Hayes. Date of Service: 05/07/2020 9:00 AM Medical Record Number: [id number] Patient Account Number: [id number] Date of Birth/Sex: [birth year] (65 y.o. M) Treating RN: Carlene Coria Primary Care Azazel Franze: Frazier Richards Other Clinician: Jeanine Luz Referring Quita Mcgrory: Frazier Richards Treating Marcy Bogosian/Extender: Skipper Cliche in Treatment: 1 Active Problems Location of Pain Severity and Description of Pain Patient Has Paino No Site Locations Pain Management and Medication Current Pain Management: Electronic Signature(s) Signed: 05/07/2020 1:29:26 PM By: Carlene Coria RN Entered By: Carlene Coria on 05/07/2020 09:27:51 Shawn Hayes ([id number]) -------------------------------------------------------------------------------- Patient/Caregiver Education Details Patient Name: Shawn Hayes. Date of Service: 05/07/2020 9:00 AM Medical Record Number: [id number] Patient Account Number: [id number] Date of Birth/Gender: 12-26-1955 (65 y.o. M) Treating RN: Dolan Amen Primary Care Physician: Frazier Richards Other Clinician: Jeanine Luz Referring Physician: Frazier Richards Treating Physician/Extender: Skipper Cliche in Treatment: 1 Education Assessment Education Provided To: Patient Education Topics Provided Notes Dressing application education Electronic Signature(s) Signed: 05/07/2020 4:13:11 PM By: Georges Mouse, Minus Breeding RN Entered By: Georges Mouse, Minus Breeding on 05/07/2020 09:43:09 Shawn Hayes ([id number]) -------------------------------------------------------------------------------- Wound Assessment Details Patient Name: Shawn Hayes Date of Service: 05/07/2020 9:00 AM Medical Record Number: [id number] Patient Account Number: [id number] Date of Birth/Sex: 11-21-1955 (65 y.o. M) Treating RN: Carlene Coria Primary Care Majorie Santee: Frazier Richards Other Clinician: Jeanine Luz Referring Aariona Momon: Frazier Richards Treating Nichalas Coin/Extender: Jeri Cos Weeks in Treatment: 1 Wound Status Wound Number: 1 Primary Etiology: Dehisced Wound Wound Location: Right Amputation Site - Below Knee Wound Status: Open Wounding Event: Surgical Injury Comorbid History: Congestive Heart Failure, Type II Diabetes Date Acquired: 02/27/2020 Weeks Of Treatment: 1 Clustered Wound: No Photos Wound Measurements Length: (cm) 0.2 Width: (cm) 1.6 Depth: (cm) 0.1 Area: (cm) 0.251 Volume: (cm) 0.025 % Reduction in Area: 68% % Reduction in Volume: 68.4% Epithelialization: None Tunneling: No Undermining: No Wound Description Classification: Full Thickness Without Exposed Support Structures Exudate Amount: Medium Exudate Type: Serosanguineous Exudate Color: red, brown Foul Odor After Cleansing: No Slough/Fibrino Yes Wound Bed Granulation Amount: None Present (0%) Exposed Structure Necrotic Amount: Large (67-100%) Fascia Exposed: No Necrotic Quality: Adherent Slough Fat Layer (Subcutaneous Tissue) Exposed: No Tendon Exposed: No Muscle Exposed: No Joint  Exposed: No Bone  Exposed: No Treatment Notes Wound #1 (Amputation Site - Below Knee) Wound Laterality: Right Cleanser Normal Saline Discharge Instruction: Wash your hands with soap and water. Remove old dressing, discard into plastic bag and place into trash. Cleanse the wound with Normal Saline prior to applying a clean dressing using gauze sponges, not tissues or cotton balls. Do not scrub or use excessive force. Pat dry using gauze sponges, not tissue or cotton balls. BENEDICTO, CAPOZZI ([id number]) Peri-Wound Care Topical Primary Dressing Prisma 4.34 (in) Discharge Instruction: Moisten w/normal saline or sterile water; Cover wound as directed. Do not remove from wound bed. Secondary Dressing Mepilex Border Flex, 4x4 (in/in) Discharge Instruction: Apply to wound as directed. Do not cut. Secured With Compression Wrap Compression Stockings Add-Ons Electronic Signature(s) Signed: 05/07/2020 1:29:26 PM By: Carlene Coria RN Entered By: Carlene Coria on 05/07/2020 09:30:41 Shawn Hayes ([id number]) -------------------------------------------------------------------------------- Wound Assessment Details Patient Name: Shawn Hayes. Date of Service: 05/07/2020 9:00 AM Medical Record Number: [id number] Patient Account Number: [id number] Date of Birth/Sex: 02/24/1956 (65 y.o. M) Treating RN: Carlene Coria Primary Care Moishe Schellenberg: Frazier Richards Other Clinician: Jeanine Luz Referring Yumi Insalaco: Frazier Richards Treating Tori Cupps/Extender: Jeri Cos Weeks in Treatment: 1 Wound Status Wound Number: 2 Primary Etiology: Dehisced Wound Wound Location: Left, Medial Amputation Site - Transmetatarsal Wound Status: Open Wounding Event: Surgical Injury Comorbid History: Congestive Heart Failure, Type II Diabetes Date Acquired: 01/28/2020 Weeks Of Treatment: 1 Clustered Wound: No Photos Wound Measurements Length: (cm) 0.2 Width: (cm) 1.5 Depth: (cm) 1.1 Area: (cm)  0.236 Volume: (cm) 0.259 % Reduction in Area: 77.7% % Reduction in Volume: 38.9% Epithelialization: None Tunneling: No Undermining: No Wound Description Classification: Full Thickness Without Exposed Support Structu Exudate Amount: Medium Exudate Type: Serosanguineous Exudate Color: red, brown res Wound Bed Granulation Amount: Small (1-33%) Exposed Structure Granulation Quality: Pink Fascia Exposed: No Necrotic Amount: Large (67-100%) Fat Layer (Subcutaneous Tissue) Exposed: Yes Necrotic Quality: Adherent Slough Tendon Exposed: No Muscle Exposed: No Joint Exposed: No Bone Exposed: No Treatment Notes Wound #2 (Amputation Site - Transmetatarsal) Wound Laterality: Left, Medial Cleanser Byram Ancillary Kit - 15 Day Supply Discharge Instruction: Use supplies as instructed; Kit contains: (15) Saline Bullets; (15) 3x3 Gauze; 15 pr Gloves Normal Saline Discharge Instruction: Wash your hands with soap and water. Remove old dressing, discard into plastic bag and place into trash. Cleanse the wound with Normal Saline prior to applying a clean dressing using gauze sponges, not tissues or cotton balls. Do not [place], [place] ([id number]) scrub or use excessive force. Pat dry using gauze sponges, not tissue or cotton balls. Peri-Wound Care Topical Primary Dressing Prisma 4.34 (in) Discharge Instruction: Do not moisten, apply directly to wound bed Secondary Dressing Mepilex Border Flex, 4x4 (in/in) Discharge Instruction: Apply to wound as directed. Do not cut. Secured With Compression Wrap Compression Stockings Add-Ons Electronic Signature(s) Signed: 05/07/2020 1:29:26 PM By: Carlene Coria RN Entered By: Carlene Coria on 05/07/2020 09:29:50 Shawn Hayes ([id number]) -------------------------------------------------------------------------------- Wound Assessment Details Patient Name: Shawn Hayes. Date of Service: 05/07/2020 9:00 AM Medical Record Number:  [id number] Patient Account Number: [id number] Date of Birth/Sex: [birth year] (65 y.o. M) Treating RN: Carlene Coria Primary Care Emeli Goguen: Frazier Richards Other Clinician: Jeanine Luz Referring Dayleen Beske: Frazier Richards Treating Dejohn Ibarra/Extender: Jeri Cos Weeks in Treatment: 1 Wound Status Wound Number: 3 Primary Etiology: Dehisced Wound Wound Location: Left, Lateral Amputation Site - Transmetatarsal Wound Status: Open Wounding Event: Surgical Injury Comorbid History: Congestive Heart Failure, Type II Diabetes Date Acquired: 01/28/2020 Weeks Of Treatment: 1 Clustered  Wound: No Photos Wound Measurements Length: (cm) 0.5 Width: (cm) 2 Depth: (cm) 0.7 Area: (cm) 0.785 Volume: (cm) 0.55 % Reduction in Area: 44.5% % Reduction in Volume: 2.7% Epithelialization: None Tunneling: No Undermining: No Wound Description Classification: Full Thickness Without Exposed Support Structures Exudate Amount: Medium Exudate Type: Serosanguineous Exudate Color: red, brown Foul Odor After Cleansing: No Slough/Fibrino Yes Wound Bed Granulation Amount: Small (1-33%) Exposed Structure Granulation Quality: Pink Fascia Exposed: No Necrotic Amount: Large (67-100%) Fat Layer (Subcutaneous Tissue) Exposed: Yes Necrotic Quality: Adherent Slough Tendon Exposed: No Muscle Exposed: No Joint Exposed: No Bone Exposed: No Treatment Notes Wound #3 (Amputation Site - Transmetatarsal) Wound Laterality: Left, Lateral Cleanser Byram Ancillary Kit - 15 Day Supply Discharge Instruction: Use supplies as instructed; Kit contains: (15) Saline Bullets; (15) 3x3 Gauze; 15 pr Gloves Normal Saline Discharge Instruction: Wash your hands with soap and water. Remove old dressing, discard into plastic bag and place into trash. Cleanse the wound with Normal Saline prior to applying a clean dressing using gauze sponges, not tissues or cotton balls. Do not [place], [place] ([id number]) scrub or use  excessive force. Pat dry using gauze sponges, not tissue or cotton balls. Peri-Wound Care Topical Primary Dressing Prisma 4.34 (in) Discharge Instruction: Moisten w/normal saline or sterile water; Cover wound as directed. Do not remove from wound bed. Secondary Dressing Mepilex Border Flex, 4x4 (in/in) Discharge Instruction: Apply to wound as directed. Do not cut. Secured With Compression Wrap Compression Stockings Add-Ons Electronic Signature(s) Signed: 05/07/2020 1:29:26 PM By: Carlene Coria RN Entered By: Carlene Coria on 05/07/2020 09:30:19 Shawn Hayes ([id number]) -------------------------------------------------------------------------------- Vitals Details Patient Name: Shawn Hayes Date of Service: 05/07/2020 9:00 AM Medical Record Number: [id number] Patient Account Number: [id number] Date of Birth/Sex: 09-26-1955 (65 y.o. M) Treating RN: Carlene Coria Primary Care Shealeigh Dunstan: Frazier Richards Other Clinician: Jeanine Luz Referring Sonya Gunnoe: Frazier Richards Treating Jarrad Mclees/Extender: Skipper Cliche in Treatment: 1 Vital Signs Time Taken: 09:27 Temperature (F): 98.5 Pulse (bpm): 67 Respiratory Rate (breaths/min): 18 Blood Pressure (mmHg): 166/81 Reference Range: 80 - 120 mg / dl Electronic Signature(s) Signed: 05/07/2020 1:29:26 PM By: Carlene Coria RN Entered By: Carlene Coria on 05/07/2020 09:27:45

## 2020-05-07 NOTE — Progress Notes (Addendum)
MARTON, MALIZIA ([id number]) Visit Report for 05/07/2020 Chief Complaint Document Details Patient Name: Shawn Hayes, Shawn Hayes. Date of Service: 05/07/2020 9:00 AM Medical Record Number: [id number] Patient Account Number: [id number] Date of Birth/Sex: 08/31/1955 (65 y.o. M) Treating RN: Dolan Amen Primary Care Provider: Frazier Richards Other Clinician: Jeanine Luz Referring Provider: Frazier Richards Treating Provider/Extender: Skipper Cliche in Treatment: 1 Information Obtained from: Patient Chief Complaint Right BKA and left foot ulcers Electronic Signature(s) Signed: 05/07/2020 9:12:58 AM By: Worthy Keeler PA-C Entered By: Worthy Keeler on 05/07/2020 09:12:58 Shawn Hayes ([id number]) -------------------------------------------------------------------------------- Debridement Details Patient Name: Shawn Hayes Date of Service: 05/07/2020 9:00 AM Medical Record Number: [id number] Patient Account Number: [id number] Date of Birth/Sex: [birth year] (65 y.o. M) Treating RN: Dolan Amen Primary Care Provider: Frazier Richards Other Clinician: Jeanine Luz Referring Provider: Frazier Richards Treating Provider/Extender: Skipper Cliche in Treatment: 1 Debridement Performed for Wound #2 Left,Medial Amputation Site - Transmetatarsal Assessment: Performed By: Physician Tommie Sams., PA-C Debridement Type: Debridement Level of Consciousness (Pre- Awake and Alert procedure): Pre-procedure Verification/Time Out Yes - 09:40 Taken: Start Time: 09:40 Pain Control: Lidocaine 4% Topical Solution Total Area Debrided (L x W): 0.2 (cm) x 1.5 (cm) = 0.3 (cm) Tissue and other material Viable, Non-Viable, Slough, Subcutaneous, Fibrin/Exudate, Slough debrided: Level: Skin/Subcutaneous Tissue Debridement Description: Excisional Instrument: Curette Bleeding: Minimum Hemostasis Achieved: Pressure Response to Treatment: Procedure was tolerated well Level  of Consciousness (Post- Awake and Alert procedure): Post Debridement Measurements of Total Wound Length: (cm) 0.2 Width: (cm) 1.5 Depth: (cm) 1.2 Volume: (cm) 0.283 Character of Wound/Ulcer Post Debridement: Stable Post Procedure Diagnosis Same as Pre-procedure Electronic Signature(s) Signed: 05/07/2020 11:11:52 AM By: Worthy Keeler PA-C Signed: 05/07/2020 4:13:11 PM By: Georges Mouse, Minus Breeding RN Entered By: Georges Mouse, Minus Breeding on 05/07/2020 09:42:23 Shawn Hayes ([id number]) -------------------------------------------------------------------------------- Debridement Details Patient Name: Shawn Hayes. Date of Service: 05/07/2020 9:00 AM Medical Record Number: [id number] Patient Account Number: [id number] Date of Birth/Sex: 1955/10/01 (65 y.o. M) Treating RN: Dolan Amen Primary Care Provider: Frazier Richards Other Clinician: Jeanine Luz Referring Provider: Frazier Richards Treating Provider/Extender: Skipper Cliche in Treatment: 1 Debridement Performed for Wound #3 Left,Lateral Amputation Site - Transmetatarsal Assessment: Performed By: Physician Tommie Sams., PA-C Debridement Type: Debridement Level of Consciousness (Pre- Awake and Alert procedure): Pre-procedure Verification/Time Out Yes - 09:40 Taken: Start Time: 09:40 Pain Control: Lidocaine 4% Topical Solution Total Area Debrided (L x W): 0.5 (cm) x 2 (cm) = 1 (cm) Tissue and other material Viable, Non-Viable, Slough, Subcutaneous, Slough debrided: Level: Skin/Subcutaneous Tissue Debridement Description: Excisional Instrument: Curette Bleeding: Minimum Hemostasis Achieved: Pressure Response to Treatment: Procedure was tolerated well Level of Consciousness (Post- Awake and Alert procedure): Post Debridement Measurements of Total Wound Length: (cm) 0.5 Width: (cm) 2 Depth: (cm) 0.8 Volume: (cm) 0.628 Character of Wound/Ulcer Post Debridement: Stable Post Procedure  Diagnosis Same as Pre-procedure Electronic Signature(s) Signed: 05/07/2020 11:11:52 AM By: Worthy Keeler PA-C Signed: 05/07/2020 4:13:11 PM By: Georges Mouse, Minus Breeding RN Entered By: Georges Mouse, Minus Breeding on 05/07/2020 09:44:31 Shawn Hayes ([id number]) -------------------------------------------------------------------------------- HPI Details Patient Name: Shawn Hayes. Date of Service: 05/07/2020 9:00 AM Medical Record Number: [id number] Patient Account Number: [id number] Date of Birth/Sex: [birth year] (65 y.o. M) Treating RN: Dolan Amen Primary Care Provider: Frazier Richards Other Clinician: Jeanine Luz Referring Provider: Frazier Richards Treating Provider/Extender: Skipper Cliche in Treatment: 1 History of Present Illness HPI Description: 04/24/2020 upon evaluation today patient presents for initial evaluation here in our clinic  concerning issues that he has been having with wound dehiscence in regard to his below-knee amputation on the right as well as transmetatarsal amputation on the left. Fortunately this does not appear to be doing too badly which is good news. There is no signs of active infection at this time. I do think he is going require little bit of sharp debridement here but fortunately overall I think he is actually managing quite nicely in a bad situation. The initial below-knee amputation was in January on the right. His transmetatarsal amputation was in November 2021. The patient states that obviously his lifestyle has changed dramatically over the past several months. He seems to be in fairly good spirits all things considered. He does have a history of diabetes mellitus type 2. 05/01/2020 upon evaluation today patient appears to be doing excellent in regard to his wounds. There again will require some sharp debridement to clear away some of the slough today but overall I feel like he is making good progress. Fortunately there does not appear to  be any evidence of active infection which is great news. No fevers, chills, nausea, vomiting, or diarrhea. 05/07/2020 upon evaluation today patient appears to be doing well with regard to his wounds in general. All are going require little bit of debridement on the left the right side not really as much although there was a suture pushing out along one of the incision lines. Nonetheless I think this is going to come right out. Fortunately there is no evidence of infection at any site which is great news. Electronic Signature(s) Signed: 05/07/2020 9:46:23 AM By: Worthy Keeler PA-C Entered By: Worthy Keeler on 05/07/2020 09:46:23 Shawn Hayes ([id number]) -------------------------------------------------------------------------------- Physical Exam Details Patient Name: Shawn Hayes Date of Service: 05/07/2020 9:00 AM Medical Record Number: [id number] Patient Account Number: [id number] Date of Birth/Sex: August 07, 1955 (65 y.o. M) Treating RN: Dolan Amen Primary Care Provider: Frazier Richards Other Clinician: Jeanine Luz Referring Provider: Frazier Richards Treating Provider/Extender: Jeri Cos Weeks in Treatment: 1 Constitutional Well-nourished and well-hydrated in no acute distress. Respiratory normal breathing without difficulty. Psychiatric this patient is able to make decisions and demonstrates good insight into disease process. Alert and Oriented x 3. pleasant and cooperative. Notes Upon inspection patient's wound bed actually showed signs of good granulation epithelization at this point. There does not appear to be any evidence of active infection which is great news and overall very pleased with where things stand. I do believe the collagen is doing a great job for him. The suture on the right amputation site actually was out I grabbed it with a pair tweezers and it is pulled right out having unraveled internally. Nonetheless this is obviously a good thing  and there was no evidence of an issue here. Electronic Signature(s) Signed: 05/07/2020 9:47:06 AM By: Worthy Keeler PA-C Previous Signature: 05/07/2020 9:46:54 AM Version By: Worthy Keeler PA-C Entered By: Worthy Keeler on 05/07/2020 09:47:06 Shawn Hayes ([id number]) -------------------------------------------------------------------------------- Physician Orders Details Patient Name: Shawn Hayes Date of Service: 05/07/2020 9:00 AM Medical Record Number: [id number] Patient Account Number: [id number] Date of Birth/Sex: 28-Jul-1955 (65 y.o. M) Treating RN: Dolan Amen Primary Care Provider: Frazier Richards Other Clinician: Jeanine Luz Referring Provider: Frazier Richards Treating Provider/Extender: Skipper Cliche in Treatment: 1 Verbal / Phone Orders: No Diagnosis Coding ICD-10 Coding Code Description E11.621 Type 2 diabetes mellitus with foot ulcer Z89.421 Acquired absence of other right toe(s) L97.522 Non-pressure chronic ulcer of other part of left  foot with fat layer exposed Z89.511 Acquired absence of right leg below knee L97.812 Non-pressure chronic ulcer of other part of right lower leg with fat layer exposed I10 Essential (primary) hypertension I50.42 Chronic combined systolic (congestive) and diastolic (congestive) heart failure J44.9 Chronic obstructive pulmonary disease, unspecified Follow-up Appointments Wound #1 Right Amputation Site - Below Knee o Return Appointment in 1 week. Wound #2 Left,Medial Amputation Site - Transmetatarsal o Return Appointment in 1 week. Wound #3 Left,Lateral Amputation Site - Transmetatarsal o Return Appointment in 1 week. Bathing/ Shower/ Hygiene Wound #1 Right Amputation Site - Below Knee o Clean wound with Normal Saline or wound cleanser. Wound #2 Left,Medial Amputation Site - Transmetatarsal o Clean wound with Normal Saline or wound cleanser. Wound #3 Left,Lateral Amputation Site -  Transmetatarsal o Clean wound with Normal Saline or wound cleanser. Wound Treatment Wound #1 - Amputation Site - Below Knee Wound Laterality: Right Cleanser: Normal Saline (Generic) 3 x Per Week/30 Days Discharge Instructions: Wash your hands with soap and water. Remove old dressing, discard into plastic bag and place into trash. Cleanse the wound with Normal Saline prior to applying a clean dressing using gauze sponges, not tissues or cotton balls. Do not scrub or use excessive force. Pat dry using gauze sponges, not tissue or cotton balls. Primary Dressing: Prisma 4.34 (in) (Generic) 3 x Per Week/30 Days Discharge Instructions: Moisten w/normal saline or sterile water; Cover wound as directed. Do not remove from wound bed. Secondary Dressing: Mepilex Border Flex, 4x4 (in/in) (Generic) 3 x Per Week/30 Days Discharge Instructions: Apply to wound as directed. Do not cut. Wound #2 - Amputation Site - Transmetatarsal Wound Laterality: Left, Medial Cleanser: Byram Ancillary Kit - 15 Day Supply 3 x Per Week/30 Days Discharge Instructions: Use supplies as instructed; Kit contains: (15) Saline Bullets; (15) 3x3 Gauze; 15 pr Gloves Cleanser: Normal Saline (Generic) 3 x Per Week/30 Days Discharge Instructions: Wash your hands with soap and water. Remove old dressing, discard into plastic bag and place into trash. Cleanse the wound with Normal Saline prior to applying a clean dressing using gauze sponges, not tissues or cotton balls. Do not scrub or use excessive force. Pat dry using gauze sponges, not tissue or cotton balls. Shawn Hayes, Shawn Hayes ([id number]) Primary Dressing: Prisma 4.34 (in) (Generic) 3 x Per Week/30 Days Discharge Instructions: Do not moisten, apply directly to wound bed Secondary Dressing: Mepilex Border Flex, 4x4 (in/in) (Generic) 3 x Per Week/30 Days Discharge Instructions: Apply to wound as directed. Do not cut. Wound #3 - Amputation Site - Transmetatarsal Wound Laterality: Left,  Lateral Cleanser: Byram Ancillary Kit - 15 Day Supply 3 x Per Week/30 Days Discharge Instructions: Use supplies as instructed; Kit contains: (15) Saline Bullets; (15) 3x3 Gauze; 15 pr Gloves Cleanser: Normal Saline (Generic) 3 x Per Week/30 Days Discharge Instructions: Wash your hands with soap and water. Remove old dressing, discard into plastic bag and place into trash. Cleanse the wound with Normal Saline prior to applying a clean dressing using gauze sponges, not tissues or cotton balls. Do not scrub or use excessive force. Pat dry using gauze sponges, not tissue or cotton balls. Primary Dressing: Prisma 4.34 (in) (Generic) 3 x Per Week/30 Days Discharge Instructions: Moisten w/normal saline or sterile water; Cover wound as directed. Do not remove from wound bed. Secondary Dressing: Mepilex Border Flex, 4x4 (in/in) (Generic) 3 x Per Week/30 Days Discharge Instructions: Apply to wound as directed. Do not cut. Electronic Signature(s) Signed: 05/07/2020 11:11:52 AM By: Worthy Keeler PA-C  Signed: 05/07/2020 4:13:11 PM By: Georges Mouse, Minus Breeding RN Entered By: Georges Mouse, Minus Breeding on 05/07/2020 09:39:46 Shawn Hayes ([id number]) -------------------------------------------------------------------------------- Problem List Details Patient Name: Shawn Hayes, Shawn Hayes. Date of Service: 05/07/2020 9:00 AM Medical Record Number: [id number] Patient Account Number: [id number] Date of Birth/Sex: [birth year] (65 y.o. M) Treating RN: Dolan Amen Primary Care Provider: Frazier Richards Other Clinician: Jeanine Luz Referring Provider: Frazier Richards Treating Provider/Extender: Skipper Cliche in Treatment: 1 Active Problems ICD-10 Encounter Code Description Active Date MDM Diagnosis E11.621 Type 2 diabetes mellitus with foot ulcer 04/24/2020 No Yes Z89.421 Acquired absence of other right toe(s) 04/24/2020 No Yes L97.522 Non-pressure chronic ulcer of other part of left foot with fat  layer 04/24/2020 No Yes exposed Z89.511 Acquired absence of right leg below knee 04/24/2020 No Yes L97.812 Non-pressure chronic ulcer of other part of right lower leg with fat layer 04/24/2020 No Yes exposed [place] (primary) hypertension 04/24/2020 No Yes I50.42 Chronic combined systolic (congestive) and diastolic (congestive) heart 04/24/2020 No Yes failure J44.9 Chronic obstructive pulmonary disease, unspecified 04/24/2020 No Yes Inactive Problems Resolved Problems Electronic Signature(s) Signed: 05/07/2020 9:12:52 AM By: Worthy Keeler PA-C Entered By: Worthy Keeler on 05/07/2020 09:12:51 Shawn Hayes ([id number]) -------------------------------------------------------------------------------- Progress Note Details Patient Name: Shawn Hayes. Date of Service: 05/07/2020 9:00 AM Medical Record Number: [id number] Patient Account Number: [id number] Date of Birth/Sex: [birth year] (65 y.o. M) Treating RN: Dolan Amen Primary Care Provider: Frazier Richards Other Clinician: Jeanine Luz Referring Provider: Frazier Richards Treating Provider/Extender: Skipper Cliche in Treatment: 1 Subjective Chief Complaint Information obtained from Patient Right BKA and left foot ulcers History of Present Illness (HPI) 04/24/2020 upon evaluation today patient presents for initial evaluation here in our clinic concerning issues that he has been having with wound dehiscence in regard to his below-knee amputation on the right as well as transmetatarsal amputation on the left. Fortunately this does not appear to be doing too badly which is good news. There is no signs of active infection at this time. I do think he is going require little bit of sharp debridement here but fortunately overall I think he is actually managing quite nicely in a bad situation. The initial below-knee amputation was in January on the right. His transmetatarsal amputation was in November 2021. The patient  states that obviously his lifestyle has changed dramatically over the past several months. He seems to be in fairly good spirits all things considered. He does have a history of diabetes mellitus type 2. 05/01/2020 upon evaluation today patient appears to be doing excellent in regard to his wounds. There again will require some sharp debridement to clear away some of the slough today but overall I feel like he is making good progress. Fortunately there does not appear to be any evidence of active infection which is great news. No fevers, chills, nausea, vomiting, or diarrhea. 05/07/2020 upon evaluation today patient appears to be doing well with regard to his wounds in general. All are going require little bit of debridement on the left the right side not really as much although there was a suture pushing out along one of the incision lines. Nonetheless I think this is going to come right out. Fortunately there is no evidence of infection at any site which is great news. Objective Constitutional Well-nourished and well-hydrated in no acute distress. Vitals Time Taken: 9:27 AM, Temperature: 98.5 F, Pulse: 67 bpm, Respiratory Rate: 18 breaths/min, Blood Pressure: 166/81 mmHg. Respiratory normal breathing without difficulty. Psychiatric this patient  is able to make decisions and demonstrates good insight into disease process. Alert and Oriented x 3. pleasant and cooperative. General Notes: Upon inspection patient's wound bed actually showed signs of good granulation epithelization at this point. There does not appear to be any evidence of active infection which is great news and overall very pleased with where things stand. I do believe the collagen is doing a great job for him. The suture on the right amputation site actually was out I grabbed it with a pair tweezers and it is pulled right out having unraveled internally. Nonetheless this is obviously a good thing and there was no evidence of an  issue here. Integumentary (Hair, Skin) Wound #1 status is Open. Original cause of wound was Surgical Injury. The date acquired was: 02/27/2020. The wound has been in treatment 1 weeks. The wound is located on the Right Amputation Site - Below Knee. The wound measures 0.2cm length x 1.6cm width x 0.1cm depth; 0.251cm^2 area and 0.025cm^3 volume. There is no tunneling or undermining noted. There is a medium amount of serosanguineous drainage noted. There is no granulation within the wound bed. There is a large (67-100%) amount of necrotic tissue within the wound bed including Adherent Slough. Wound #2 status is Open. Original cause of wound was Surgical Injury. The date acquired was: 01/28/2020. The wound has been in treatment 1 weeks. The wound is located on the Left,Medial Amputation Site - Transmetatarsal. The wound measures 0.2cm length x 1.5cm width x 1.1cm depth; 0.236cm^2 area and 0.259cm^3 volume. There is Fat Layer (Subcutaneous Tissue) exposed. There is no tunneling or undermining noted. There is a medium amount of serosanguineous drainage noted. There is small (1-33%) pink granulation within the wound bed. There is a large (67-100%) amount of necrotic tissue within the wound bed including Adherent Slough. Wound #3 status is Open. Original cause of wound was Surgical Injury. The date acquired was: 01/28/2020. The wound has been in treatment 1 [place], [place] ([id number]) weeks. The wound is located on the Left,Lateral Amputation Site - Transmetatarsal. The wound measures 0.5cm length x 2cm width x 0.7cm depth; 0.785cm^2 area and 0.55cm^3 volume. There is Fat Layer (Subcutaneous Tissue) exposed. There is no tunneling or undermining noted. There is a medium amount of serosanguineous drainage noted. There is small (1-33%) pink granulation within the wound bed. There is a large (67-100%) amount of necrotic tissue within the wound bed including Adherent Slough. Assessment Active  Problems ICD-10 Type 2 diabetes mellitus with foot ulcer Acquired absence of other right toe(s) Non-pressure chronic ulcer of other part of left foot with fat layer exposed Acquired absence of right leg below knee Non-pressure chronic ulcer of other part of right lower leg with fat layer exposed Essential (primary) hypertension Chronic combined systolic (congestive) and diastolic (congestive) heart failure Chronic obstructive pulmonary disease, unspecified Procedures Wound #2 Pre-procedure diagnosis of Wound #2 is a Dehisced Wound located on the Left,Medial Amputation Site - Transmetatarsal . There was a Excisional Skin/Subcutaneous Tissue Debridement with a total area of 0.3 sq cm performed by Tommie Sams., PA-C. With the following instrument(s): Curette to remove Viable and Non-Viable tissue/material. Material removed includes Subcutaneous Tissue, Slough, and Fibrin/Exudate after achieving pain control using Lidocaine 4% Topical Solution. A time out was conducted at 09:40, prior to the start of the procedure. A Minimum amount of bleeding was controlled with Pressure. The procedure was tolerated well. Post Debridement Measurements: 0.2cm length x 1.5cm width x 1.2cm depth; 0.283cm^3 volume. Character of Wound/Ulcer  Post Debridement is stable. Post procedure Diagnosis Wound #2: Same as Pre-Procedure Wound #3 Pre-procedure diagnosis of Wound #3 is a Dehisced Wound located on the Left,Lateral Amputation Site - Transmetatarsal . There was a Excisional Skin/Subcutaneous Tissue Debridement with a total area of 1 sq cm performed by Tommie Sams., PA-C. With the following instrument(s): Curette to remove Viable and Non-Viable tissue/material. Material removed includes Subcutaneous Tissue and Slough and after achieving pain control using Lidocaine 4% Topical Solution. A time out was conducted at 09:40, prior to the start of the procedure. A Minimum amount of bleeding was controlled with Pressure.  The procedure was tolerated well. Post Debridement Measurements: 0.5cm length x 2cm width x 0.8cm depth; 0.628cm^3 volume. Character of Wound/Ulcer Post Debridement is stable. Post procedure Diagnosis Wound #3: Same as Pre-Procedure Plan Follow-up Appointments: Wound #1 Right Amputation Site - Below Knee: Return Appointment in 1 week. Wound #2 Left,Medial Amputation Site - Transmetatarsal: Return Appointment in 1 week. Wound #3 Left,Lateral Amputation Site - Transmetatarsal: Return Appointment in 1 week. Bathing/ Shower/ Hygiene: Wound #1 Right Amputation Site - Below Knee: Clean wound with Normal Saline or wound cleanser. Wound #2 Left,Medial Amputation Site - Transmetatarsal: Clean wound with Normal Saline or wound cleanser. Wound #3 Left,Lateral Amputation Site - Transmetatarsal: Clean wound with Normal Saline or wound cleanser. WOUND #1: - Amputation Site - Below Knee Wound Laterality: Right Cleanser: Normal Saline (Generic) 3 x Per Week/30 Days Discharge Instructions: Wash your hands with soap and water. Remove old dressing, discard into plastic bag and place into trash. Cleanse the wound with Normal Saline prior to applying a clean dressing using gauze sponges, not tissues or cotton balls. Do not scrub or use Shawn Hayes, Shawn L. ([id number]) excessive force. Pat dry using gauze sponges, not tissue or cotton balls. Primary Dressing: Prisma 4.34 (in) (Generic) 3 x Per Week/30 Days Discharge Instructions: Moisten w/normal saline or sterile water; Cover wound as directed. Do not remove from wound bed. Secondary Dressing: Mepilex Border Flex, 4x4 (in/in) (Generic) 3 x Per Week/30 Days Discharge Instructions: Apply to wound as directed. Do not cut. WOUND #2: - Amputation Site - Transmetatarsal Wound Laterality: Left, Medial Cleanser: Byram Ancillary Kit - 15 Day Supply 3 x Per Week/30 Days Discharge Instructions: Use supplies as instructed; Kit contains: (15) Saline Bullets; (15) 3x3  Gauze; 15 pr Gloves Cleanser: Normal Saline (Generic) 3 x Per Week/30 Days Discharge Instructions: Wash your hands with soap and water. Remove old dressing, discard into plastic bag and place into trash. Cleanse the wound with Normal Saline prior to applying a clean dressing using gauze sponges, not tissues or cotton balls. Do not scrub or use excessive force. Pat dry using gauze sponges, not tissue or cotton balls. Primary Dressing: Prisma 4.34 (in) (Generic) 3 x Per Week/30 Days Discharge Instructions: Do not moisten, apply directly to wound bed Secondary Dressing: Mepilex Border Flex, 4x4 (in/in) (Generic) 3 x Per Week/30 Days Discharge Instructions: Apply to wound as directed. Do not cut. WOUND #3: - Amputation Site - Transmetatarsal Wound Laterality: Left, Lateral Cleanser: Byram Ancillary Kit - 15 Day Supply 3 x Per Week/30 Days Discharge Instructions: Use supplies as instructed; Kit contains: (15) Saline Bullets; (15) 3x3 Gauze; 15 pr Gloves Cleanser: Normal Saline (Generic) 3 x Per Week/30 Days Discharge Instructions: Wash your hands with soap and water. Remove old dressing, discard into plastic bag and place into trash. Cleanse the wound with Normal Saline prior to applying a clean dressing using gauze sponges, not tissues or  cotton balls. Do not scrub or use excessive force. Pat dry using gauze sponges, not tissue or cotton balls. Primary Dressing: Prisma 4.34 (in) (Generic) 3 x Per Week/30 Days Discharge Instructions: Moisten w/normal saline or sterile water; Cover wound as directed. Do not remove from wound bed. Secondary Dressing: Mepilex Border Flex, 4x4 (in/in) (Generic) 3 x Per Week/30 Days Discharge Instructions: Apply to wound as directed. Do not cut. 1. Would recommend currently that we going to continue with the wound care management for the patient is in agreement with plan this includes the use of silver collagen to all wound locations which I think is appropriate. We will  put it and dry on the left foot. 2. I am also can recommend covering with border foam dressings. 3. I am also can recommend the patient should continue to take it easy as far as his foot is concerned. He really should not be putting much pressure on this region. We will see patient back for reevaluation in 1 week here in the clinic. If anything worsens or changes patient will contact our office for additional recommendations. Electronic Signature(s) Signed: 05/07/2020 9:47:47 AM By: Worthy Keeler PA-C Entered By: Worthy Keeler on 05/07/2020 09:47:47 Shawn Hayes ([id number]) -------------------------------------------------------------------------------- SuperBill Details Patient Name: Shawn Hayes Date of Service: 05/07/2020 Medical Record Number: [id number] Patient Account Number: [id number] Date of Birth/Sex: 1955/09/08 (65 y.o. M) Treating RN: Dolan Amen Primary Care Provider: Frazier Richards Other Clinician: Jeanine Luz Referring Provider: Frazier Richards Treating Provider/Extender: Jeri Cos Weeks in Treatment: 1 Diagnosis Coding ICD-10 Codes Code Description [phone] Type 2 diabetes mellitus with foot ulcer Z89.421 Acquired absence of other right toe(s) L97.522 Non-pressure chronic ulcer of other part of left foot with fat layer exposed Z89.511 Acquired absence of right leg below knee L97.812 Non-pressure chronic ulcer of other part of right lower leg with fat layer exposed I10 Essential (primary) hypertension I50.42 Chronic combined systolic (congestive) and diastolic (congestive) heart failure J44.9 Chronic obstructive pulmonary disease, unspecified Facility Procedures CPT4 Code: [id number] Description: 46568 - DEB SUBQ TISSUE 20 SQ CM/< Modifier: Quantity: 1 CPT4 Code: Description: ICD-10 Diagnosis Description L97.522 Non-pressure chronic ulcer of other part of left foot with fat layer exp Modifier: osed Quantity: Physician Procedures CPT4  Code: [id number] Description: 11042 - WC PHYS SUBQ TISS 20 SQ CM Modifier: Quantity: 1 CPT4 Code: Description: ICD-10 Diagnosis Description L97.522 Non-pressure chronic ulcer of other part of left foot with fat layer exp Modifier: osed Quantity: Electronic Signature(s) Signed: 05/07/2020 9:48:06 AM By: Worthy Keeler PA-C Entered By: Worthy Keeler on 05/07/2020 09:48:06

## 2020-05-14 ENCOUNTER — Other Ambulatory Visit: Payer: Self-pay

## 2020-05-14 ENCOUNTER — Encounter: Payer: Medicare HMO | Admitting: Physician Assistant

## 2020-05-14 DIAGNOSIS — Z89511 Acquired absence of right leg below knee: Secondary | ICD-10-CM | POA: Diagnosis not present

## 2020-05-14 DIAGNOSIS — L97522 Non-pressure chronic ulcer of other part of left foot with fat layer exposed: Secondary | ICD-10-CM | POA: Diagnosis not present

## 2020-05-14 DIAGNOSIS — E11621 Type 2 diabetes mellitus with foot ulcer: Secondary | ICD-10-CM | POA: Diagnosis not present

## 2020-05-14 DIAGNOSIS — I5042 Chronic combined systolic (congestive) and diastolic (congestive) heart failure: Secondary | ICD-10-CM | POA: Diagnosis not present

## 2020-05-14 DIAGNOSIS — L97812 Non-pressure chronic ulcer of other part of right lower leg with fat layer exposed: Secondary | ICD-10-CM | POA: Diagnosis not present

## 2020-05-14 DIAGNOSIS — I110 Hypertensive heart disease with heart failure: Secondary | ICD-10-CM | POA: Diagnosis not present

## 2020-05-14 DIAGNOSIS — T8131XA Disruption of external operation (surgical) wound, not elsewhere classified, initial encounter: Secondary | ICD-10-CM | POA: Diagnosis not present

## 2020-05-14 NOTE — Progress Notes (Addendum)
Shawn Hayes ([id number]) Visit Report for 05/14/2020 Chief Complaint Document Details Patient Name: Shawn Hayes, Shawn Hayes. Date of Service: 05/14/2020 1:45 PM Medical Record Number: [id number] Patient Account Number: [id number] Date of Birth/Sex: 1955/07/02 (65 y.o. M) Treating RN: Dolan Amen Primary Care Provider: Frazier Richards Other Clinician: Jeanine Luz Referring Provider: Frazier Richards Treating Provider/Extender: Skipper Cliche in Treatment: 2 Information Obtained from: Patient Chief Complaint Right BKA and left foot ulcers Electronic Signature(s) Signed: 05/14/2020 1:53:53 PM By: Worthy Keeler PA-C Entered By: Worthy Keeler on 05/14/2020 13:53:53 Shawn Hayes ([id number]) -------------------------------------------------------------------------------- Debridement Details Patient Name: Shawn Hayes Date of Service: 05/14/2020 1:45 PM Medical Record Number: [id number] Patient Account Number: [id number] Date of Birth/Sex: 07-16-1955 (65 y.o. M) Treating RN: Cornell Barman Primary Care Provider: Frazier Richards Other Clinician: Jeanine Luz Referring Provider: Frazier Richards Treating Provider/Extender: Skipper Cliche in Treatment: 2 Debridement Performed for Wound #2 Left,Medial Amputation Site - Transmetatarsal Assessment: Performed By: Physician Tommie Sams., PA-C Debridement Type: Debridement Level of Consciousness (Pre- Awake and Alert procedure): Pre-procedure Verification/Time Out Yes - 02:45 Taken: Total Area Debrided (L x W): 0.2 (cm) x 1.5 (cm) = 0.3 (cm) Tissue and other material Viable, Non-Viable, Slough, Subcutaneous, Fibrin/Exudate, Slough, Other: dressing material debrided: Level: Skin/Subcutaneous Tissue Debridement Description: Excisional Instrument: Curette Bleeding: Minimum Hemostasis Achieved: Pressure Response to Treatment: Procedure was tolerated well Level of Consciousness (Post- Awake and  Alert procedure): Post Debridement Measurements of Total Wound Length: (cm) 0.2 Width: (cm) 1.5 Depth: (cm) 0.5 Volume: (cm) 0.118 Character of Wound/Ulcer Post Debridement: Stable Post Procedure Diagnosis Same as Pre-procedure Electronic Signature(s) Signed: 05/14/2020 3:06:40 PM By: Gretta Cool, BSN, RN, CWS, Kim RN, BSN Signed: 05/14/2020 3:42:51 PM By: Worthy Keeler PA-C Entered By: Gretta Cool, BSN, RN, CWS, Kim on 05/14/2020 15:06:40 Shawn Hayes ([id number]) -------------------------------------------------------------------------------- Debridement Details Patient Name: Shawn Hayes. Date of Service: 05/14/2020 1:45 PM Medical Record Number: [id number] Patient Account Number: [id number] Date of Birth/Sex: Jan 11, 1956 (65 y.o. M) Treating RN: Cornell Barman Primary Care Provider: Frazier Richards Other Clinician: Jeanine Luz Referring Provider: Frazier Richards Treating Provider/Extender: Skipper Cliche in Treatment: 2 Debridement Performed for Wound #3 Left,Lateral Amputation Site - Transmetatarsal Assessment: Performed By: Physician Tommie Sams., PA-C Debridement Type: Debridement Level of Consciousness (Pre- Awake and Alert procedure): Pre-procedure Verification/Time Out Yes - 02:45 Taken: Total Area Debrided (L x W): 0.5 (cm) x 0.7 (cm) = 0.35 (cm) Tissue and other material Viable, Non-Viable, Slough, Subcutaneous, Fibrin/Exudate, Slough, Other: dressing material debrided: Level: Skin/Subcutaneous Tissue Debridement Description: Excisional Instrument: Curette Bleeding: Minimum Hemostasis Achieved: Pressure Response to Treatment: Procedure was tolerated well Level of Consciousness (Post- Awake and Alert procedure): Post Debridement Measurements of Total Wound Length: (cm) 0.5 Width: (cm) 0.7 Depth: (cm) 0.5 Volume: (cm) 0.137 Character of Wound/Ulcer Post Debridement: Stable Post Procedure Diagnosis Same as Pre-procedure Electronic  Signature(s) Signed: 05/14/2020 3:07:21 PM By: Gretta Cool, BSN, RN, CWS, Kim RN, BSN Signed: 05/14/2020 3:42:51 PM By: Worthy Keeler PA-C Entered By: Gretta Cool, BSN, RN, CWS, Kim on 05/14/2020 15:07:21 Shawn Hayes ([id number]) -------------------------------------------------------------------------------- HPI Details Patient Name: Shawn Hayes. Date of Service: 05/14/2020 1:45 PM Medical Record Number: [id number] Patient Account Number: [id number] Date of Birth/Sex: 04-05-1955 (65 y.o. M) Treating RN: Dolan Amen Primary Care Provider: Frazier Richards Other Clinician: Jeanine Luz Referring Provider: Frazier Richards Treating Provider/Extender: Skipper Cliche in Treatment: 2 History of Present Illness HPI Description: 04/24/2020 upon evaluation today patient presents for initial evaluation here in our clinic concerning  issues that he has been having with wound dehiscence in regard to his below-knee amputation on the right as well as transmetatarsal amputation on the left. Fortunately this does not appear to be doing too badly which is good news. There is no signs of active infection at this time. I do think he is going require little bit of sharp debridement here but fortunately overall I think he is actually managing quite nicely in a bad situation. The initial below-knee amputation was in January on the right. His transmetatarsal amputation was in November 2021. The patient states that obviously his lifestyle has changed dramatically over the past several months. He seems to be in fairly good spirits all things considered. He does have a history of diabetes mellitus type 2. 05/01/2020 upon evaluation today patient appears to be doing excellent in regard to his wounds. There again will require some sharp debridement to clear away some of the slough today but overall I feel like he is making good progress. Fortunately there does not appear to be any evidence of active infection  which is great news. No fevers, chills, nausea, vomiting, or diarrhea. 05/07/2020 upon evaluation today patient appears to be doing well with regard to his wounds in general. All are going require little bit of debridement on the left the right side not really as much although there was a suture pushing out along one of the incision lines. Nonetheless I think this is going to come right out. Fortunately there is no evidence of infection at any site which is great news. 05/14/2020 on evaluation today patient actually appears to be making great progress. In fact his wound on the right stump is actually completely healed. The left foot amputation site has 2 small areas still open there is a lot of drainage that is going to have to be removed but seems to be doing better. Electronic Signature(s) Signed: 05/14/2020 2:40:07 PM By: Worthy Keeler PA-C Entered By: Worthy Keeler on 05/14/2020 14:40:06 Shawn Hayes ([id number]) -------------------------------------------------------------------------------- Physical Exam Details Patient Name: Shawn Hayes Date of Service: 05/14/2020 1:45 PM Medical Record Number: [id number] Patient Account Number: [id number] Date of Birth/Sex: November 26, 1955 (65 y.o. M) Treating RN: Dolan Amen Primary Care Provider: Frazier Richards Other Clinician: Jeanine Luz Referring Provider: Frazier Richards Treating Provider/Extender: Jeri Cos Weeks in Treatment: 2 Constitutional Well-nourished and well-hydrated in no acute distress. Respiratory normal breathing without difficulty. Psychiatric this patient is able to make decisions and demonstrates good insight into disease process. Alert and Oriented x 3. pleasant and cooperative. Notes Patient's wound bed actually showed signs of good granulation epithelization at this point. There does not appear to be any evidence of active infection which is great news overall very pleased in that regard. No  fevers, chills, nausea, vomiting, or diarrhea. I did perform sharp debridement over the left transmetatarsal amputation site and the 2 locations where he had a lot of drainage and dressing tried to this area. I was able to remove the majority of all this today and post debridement the wound bed appears to be doing much better which is great news I think he is very close to complete closure have both of these locations. Electronic Signature(s) Signed: 05/14/2020 2:40:44 PM By: Worthy Keeler PA-C Entered By: Worthy Keeler on 05/14/2020 14:40:44 Shawn Hayes ([id number]) -------------------------------------------------------------------------------- Physician Orders Details Patient Name: Shawn Hayes Date of Service: 05/14/2020 1:45 PM Medical Record Number: [id number] Patient Account Number: [id number] Date of Birth/Sex: Apr 21, 1955 (64 y.o.  M) Treating RN: Cornell Barman Primary Care Provider: Frazier Richards Other Clinician: Jeanine Luz Referring Provider: Frazier Richards Treating Provider/Extender: Skipper Cliche in Treatment: 2 Verbal / Phone Orders: No Diagnosis Coding ICD-10 Coding Code Description E11.621 Type 2 diabetes mellitus with foot ulcer Z89.421 Acquired absence of other right toe(s) L97.522 Non-pressure chronic ulcer of other part of left foot with fat layer exposed Z89.511 Acquired absence of right leg below knee L97.812 Non-pressure chronic ulcer of other part of right lower leg with fat layer exposed I10 Essential (primary) hypertension I50.42 Chronic combined systolic (congestive) and diastolic (congestive) heart failure J44.9 Chronic obstructive pulmonary disease, unspecified Follow-up Appointments o Return Appointment in 1 week. Bathing/ Shower/ Hygiene o Clean wound with Normal Saline or wound cleanser. Wound Treatment Wound #2 - Amputation Site - Transmetatarsal Wound Laterality: Left, Medial Cleanser: Normal Saline (Generic) 3 x Per  Week/30 Days Discharge Instructions: Wash your hands with soap and water. Remove old dressing, discard into plastic bag and place into trash. Cleanse the wound with Normal Saline prior to applying a clean dressing using gauze sponges, not tissues or cotton balls. Do not scrub or use excessive force. Pat dry using gauze sponges, not tissue or cotton balls. Primary Dressing: Prisma 4.34 (in) (Generic) 3 x Per Week/30 Days Discharge Instructions: Do not moisten, apply directly to wound bed Secondary Dressing: Mepilex Border Flex, 4x4 (in/in) (Generic) 3 x Per Week/30 Days Discharge Instructions: Apply to wound as directed. Do not cut. Wound #3 - Amputation Site - Transmetatarsal Wound Laterality: Left, Lateral Cleanser: Normal Saline (Generic) 3 x Per Week/30 Days Discharge Instructions: Wash your hands with soap and water. Remove old dressing, discard into plastic bag and place into trash. Cleanse the wound with Normal Saline prior to applying a clean dressing using gauze sponges, not tissues or cotton balls. Do not scrub or use excessive force. Pat dry using gauze sponges, not tissue or cotton balls. Primary Dressing: Prisma 4.34 (in) (Generic) 3 x Per Week/30 Days Discharge Instructions: Moisten w/normal saline or sterile water; Cover wound as directed. Do not remove from wound bed. Secondary Dressing: Mepilex Border Flex, 4x4 (in/in) (Generic) 3 x Per Week/30 Days Discharge Instructions: Apply to wound as directed. Do not cut. Electronic Signature(s) Signed: 05/14/2020 3:12:22 PM By: Gretta Cool, BSN, RN, CWS, Kim RN, BSN Signed: 05/14/2020 3:42:51 PM By: Worthy Keeler PA-C Previous Signature: 05/14/2020 3:11:56 PM Version By: Gretta Cool, BSN, RN, CWS, Kim RN, BSN Entered By: Gretta Cool, BSN, RN, CWS, Kim on 05/14/2020 15:12:21 Shawn Hayes ([id number]) -------------------------------------------------------------------------------- Problem List Details Patient Name: Shawn Hayes, Shawn Hayes Date of  Service: 05/14/2020 1:45 PM Medical Record Number: [id number] Patient Account Number: [id number] Date of Birth/Sex: [birth year] (65 y.o. M) Treating RN: Dolan Amen Primary Care Provider: Frazier Richards Other Clinician: Jeanine Luz Referring Provider: Frazier Richards Treating Provider/Extender: Skipper Cliche in Treatment: 2 Active Problems ICD-10 Encounter Code Description Active Date MDM Diagnosis E11.621 Type 2 diabetes mellitus with foot ulcer 04/24/2020 No Yes Z89.421 Acquired absence of other right toe(s) 04/24/2020 No Yes L97.522 Non-pressure chronic ulcer of other part of left foot with fat layer 04/24/2020 No Yes exposed Z89.511 Acquired absence of right leg below knee 04/24/2020 No Yes L97.812 Non-pressure chronic ulcer of other part of right lower leg with fat layer 04/24/2020 No Yes exposed [place] (primary) hypertension 04/24/2020 No Yes I50.42 Chronic combined systolic (congestive) and diastolic (congestive) heart 04/24/2020 No Yes failure J44.9 Chronic obstructive pulmonary disease, unspecified 04/24/2020 No Yes Inactive Problems Resolved Problems Electronic  Signature(s) Signed: 05/14/2020 1:53:47 PM By: Worthy Keeler PA-C Entered By: Worthy Keeler on 05/14/2020 13:53:47 Shawn Hayes ([id number]) -------------------------------------------------------------------------------- Progress Note Details Patient Name: Shawn Hayes Date of Service: 05/14/2020 1:45 PM Medical Record Number: [id number] Patient Account Number: [id number] Date of Birth/Sex: [birth year] (65 y.o. M) Treating RN: Dolan Amen Primary Care Provider: Frazier Richards Other Clinician: Jeanine Luz Referring Provider: Frazier Richards Treating Provider/Extender: Skipper Cliche in Treatment: 2 Subjective Chief Complaint Information obtained from Patient Right BKA and left foot ulcers History of Present Illness (HPI) 04/24/2020 upon evaluation today patient  presents for initial evaluation here in our clinic concerning issues that he has been having with wound dehiscence in regard to his below-knee amputation on the right as well as transmetatarsal amputation on the left. Fortunately this does not appear to be doing too badly which is good news. There is no signs of active infection at this time. I do think he is going require little bit of sharp debridement here but fortunately overall I think he is actually managing quite nicely in a bad situation. The initial below-knee amputation was in January on the right. His transmetatarsal amputation was in November 2021. The patient states that obviously his lifestyle has changed dramatically over the past several months. He seems to be in fairly good spirits all things considered. He does have a history of diabetes mellitus type 2. 05/01/2020 upon evaluation today patient appears to be doing excellent in regard to his wounds. There again will require some sharp debridement to clear away some of the slough today but overall I feel like he is making good progress. Fortunately there does not appear to be any evidence of active infection which is great news. No fevers, chills, nausea, vomiting, or diarrhea. 05/07/2020 upon evaluation today patient appears to be doing well with regard to his wounds in general. All are going require little bit of debridement on the left the right side not really as much although there was a suture pushing out along one of the incision lines. Nonetheless I think this is going to come right out. Fortunately there is no evidence of infection at any site which is great news. 05/14/2020 on evaluation today patient actually appears to be making great progress. In fact his wound on the right stump is actually completely healed. The left foot amputation site has 2 small areas still open there is a lot of drainage that is going to have to be removed but seems to be doing  better. Objective Constitutional Well-nourished and well-hydrated in no acute distress. Vitals Time Taken: 2:08 PM, Temperature: 97.7 F, Pulse: 64 bpm, Respiratory Rate: 18 breaths/min, Blood Pressure: 158/79 mmHg. Respiratory normal breathing without difficulty. Psychiatric this patient is able to make decisions and demonstrates good insight into disease process. Alert and Oriented x 3. pleasant and cooperative. General Notes: Patient's wound bed actually showed signs of good granulation epithelization at this point. There does not appear to be any evidence of active infection which is great news overall very pleased in that regard. No fevers, chills, nausea, vomiting, or diarrhea. I did perform sharp debridement over the left transmetatarsal amputation site and the 2 locations where he had a lot of drainage and dressing tried to this area. I was able to remove the majority of all this today and post debridement the wound bed appears to be doing much better which is great news I think he is very close to complete closure have both of these locations.  Integumentary (Hair, Skin) Wound #1 status is Healed - Epithelialized. Original cause of wound was Surgical Injury. The date acquired was: 02/27/2020. The wound has been in treatment 2 weeks. The wound is located on the Right Amputation Site - Below Knee. The wound measures 0cm length x 0cm width x 0cm depth; 0cm^2 area and 0cm^3 volume. Wound #2 status is Open. Original cause of wound was Surgical Injury. The date acquired was: 01/28/2020. The wound has been in treatment 2 weeks. The wound is located on the Left,Medial Amputation Site - Transmetatarsal. The wound measures 0.2cm length x 1.5cm width x 0.5cm depth; 0.236cm^2 area and 0.118cm^3 volume. Wound #3 status is Open. Original cause of wound was Surgical Injury. The date acquired was: 01/28/2020. The wound has been in treatment 2 [place], [place] ([id number]) weeks. The wound is located  on the Left,Lateral Amputation Site - Transmetatarsal. The wound measures 0.5cm length x 0.7cm width x 0.5cm depth; 0.275cm^2 area and 0.137cm^3 volume. 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 There was a Excisional Skin/Subcutaneous Tissue Debridement with a total area of 0.3 sq cm performed by Tommie Sams., PA-C. With the following instrument(s): Curette to remove Viable and Non-Viable tissue/material. Material removed includes Subcutaneous Tissue, Slough, Fibrin/Exudate, and Other: dressing material. No specimens were taken. A time out was conducted at 02:45, prior to the start of the procedure. A Minimum amount of bleeding was controlled with Pressure. The procedure was tolerated well. Post Debridement Measurements: 0.2cm length x 1.5cm width x 0.5cm depth; 0.118cm^3 volume. Character of Wound/Ulcer Post Debridement is stable. Post procedure Diagnosis Wound #2: Same as Pre-Procedure Wound #3 Pre-procedure diagnosis of Wound #3 is a Dehisced Wound located on the Left,Lateral Amputation Site - Transmetatarsal . There was a Excisional Skin/Subcutaneous Tissue Debridement with a total area of 0.35 sq cm performed by Tommie Sams., PA-C. With the following instrument(s): Curette to remove Viable and Non-Viable tissue/material. Material removed includes Subcutaneous Tissue, Slough, Fibrin/Exudate, and Other: dressing  material. No specimens were taken. A time out was conducted at 02:45, prior to the start of the procedure. A Minimum amount of bleeding was controlled with Pressure. The procedure was tolerated well. Post Debridement Measurements: 0.5cm length x 0.7cm width x 0.5cm depth; 0.137cm^3 volume. Character of Wound/Ulcer Post Debridement is stable. Post procedure Diagnosis Wound #3: Same as Pre-Procedure Plan Follow-up Appointments: Return Appointment in 1 week. Bathing/ Shower/ Hygiene: Clean wound with Normal Saline or wound cleanser. WOUND #2: - Amputation Site - Transmetatarsal Wound Laterality: Left, Medial Cleanser: Normal Saline (Generic) 3 x Per Week/30 Days Discharge Instructions: Wash your hands with soap and water. Remove old dressing, discard into plastic bag and place into trash. Cleanse the wound with Normal Saline prior to applying a clean dressing using gauze sponges, not tissues or cotton balls. Do not scrub or use excessive force. Pat dry using gauze sponges, not tissue or cotton balls. Primary Dressing: Prisma 4.34 (in) (Generic) 3 x Per Week/30 Days Discharge Instructions: Do not moisten, apply directly to wound bed  Secondary Dressing: Mepilex Border Flex, 4x4 (in/in) (Generic) 3 x Per Week/30 Days Discharge Instructions: Apply to wound as directed. Do not cut. WOUND #3: - Amputation Site - Transmetatarsal Wound Laterality: Left, Lateral Cleanser: Normal Saline (Generic) 3 x Per Week/30 Days Discharge Instructions: Wash your hands with soap and water. Remove old dressing, discard into plastic bag and place into trash. Cleanse the wound with Normal Saline prior to applying a clean dressing using gauze sponges, not tissues or cotton balls. Do not scrub or use excessive force. Pat dry using gauze sponges, not tissue or cotton balls. Primary Dressing: Prisma 4.34 (in) (Generic) 3 x Per Week/30 Days Discharge Instructions: Moisten w/normal saline or sterile water; Cover wound as  directed. Do not remove from wound bed. Shawn Hayes, Shawn Hayes ([id number]) Secondary Dressing: Mepilex Border Flex, 4x4 (in/in) (Generic) 3 x Per Week/30 Days Discharge Instructions: Apply to wound as directed. Do not cut. 1. Would recommend currently that we continue with wound care measures as before and the patient is in agreement with plan this includes the use of the silver collagen dressing that we will moisten it on the wounds of the left transmetatarsal amputation site. 2. I am also can recommend that we continue to cover this with the border foam dressing which seems to be doing well as well for him. We will see patient back for reevaluation in 1 week here in the clinic. If anything worsens or changes patient will contact our office for additional recommendations. Electronic Signature(s) Signed: 05/14/2020 3:18:03 PM By: Worthy Keeler PA-C Previous Signature: 05/14/2020 2:41:27 PM Version By: Worthy Keeler PA-C Entered By: Worthy Keeler on 05/14/2020 15:18:03 Shawn Hayes ([id number]) -------------------------------------------------------------------------------- SuperBill Details Patient Name: Shawn Hayes Date of Service: 05/14/2020 Medical Record Number: [id number] Patient Account Number: [id number] Date of Birth/Sex: February 08, 1956 (65 y.o. M) Treating RN: Dolan Amen Primary Care Provider: Frazier Richards Other Clinician: Jeanine Luz Referring Provider: Frazier Richards Treating Provider/Extender: Jeri Cos Weeks in Treatment: 2 Diagnosis Coding ICD-10 Codes Code Description [phone] Type 2 diabetes mellitus with foot ulcer Z89.421 Acquired absence of other right toe(s) L97.522 Non-pressure chronic ulcer of other part of left foot with fat layer exposed Z89.511 Acquired absence of right leg below knee L97.812 Non-pressure chronic ulcer of other part of right lower leg with fat layer exposed I10 Essential (primary) hypertension I50.42 Chronic combined  systolic (congestive) and diastolic (congestive) heart failure J44.9 Chronic obstructive pulmonary disease, unspecified Facility Procedures CPT4 Code: [id number] Description: [id number] - DEB SUBQ TISSUE 20 SQ CM/< Modifier: Quantity: 1 CPT4 Code: Description: ICD-10 Diagnosis Description L97.522 Non-pressure chronic ulcer of other part of left foot with fat layer exp Modifier: osed Quantity: Physician Procedures CPT4 Code: [id number] Description: 11042 - WC PHYS SUBQ TISS 20 SQ CM Modifier: Quantity: 1 CPT4 Code: Description: ICD-10 Diagnosis Description L97.522 Non-pressure chronic ulcer of other part of left foot with fat layer exp Modifier: osed Quantity: Electronic Signature(s) Signed: 05/14/2020 3:18:15 PM By: Worthy Keeler PA-C Entered By: Worthy Keeler on 05/14/2020 15:18:14

## 2020-05-14 NOTE — Progress Notes (Addendum)
Shawn Hayes, Shawn Hayes ([id number]) Visit Report for 05/14/2020 Arrival Information Details Patient Name: Shawn Hayes, Shawn Hayes. Date of Service: 05/14/2020 1:45 PM Medical Record Number: [id number] Patient Account Number: [id number] Date of Birth/Sex: Jan 07, 1956 (65 y.o. M) Treating RN: Shawn Hayes Primary Care Shawn Hayes: Shawn Hayes Other Clinician: Jeanine Hayes Referring Shawn Hayes: Shawn Hayes Treating Shawn Hayes/Extender: Shawn Hayes in Treatment: 2 Visit Information History Since Last Visit Added or deleted any medications: No Patient Arrived: Wheel Chair Any new allergies or adverse reactions: No Arrival Time: 14:08 Had a fall or experienced change in No Accompanied By: self activities of daily living that may affect Transfer Assistance: None risk of falls: Patient Identification Verified: Yes Signs or symptoms of abuse/neglect since last visito No Secondary Verification Process Completed: Yes Hospitalized since last visit: No Patient Requires Transmission-Based Precautions: No Implantable device outside of the clinic excluding No Patient Has Alerts: Yes cellular tissue based products placed in the center Patient Alerts: Type II Diabetic since last visit: Has Dressing in Place as Prescribed: Yes Pain Present Now: No Electronic Signature(s) Signed: 05/14/2020 4:39:06 PM By: Shawn Hayes RCP, RRT, CHT Entered By: Shawn Hayes on 05/14/2020 14:08:53 Shawn Hayes ([id number]) -------------------------------------------------------------------------------- Encounter Discharge Information Details Patient Name: Shawn Hayes. Date of Service: 05/14/2020 1:45 PM Medical Record Number: [id number] Patient Account Number: [id number] Date of Birth/Sex: 07-31-1955 (65 y.o. M) Treating RN: Shawn Hayes Primary Care Celeste Tavenner: Shawn Hayes Other Clinician: Jeanine Hayes Referring Asael Pann: Shawn Hayes Treating  Niti Leisure/Extender: Shawn Hayes in Treatment: 2 Encounter Discharge Information Items Post Procedure Vitals Discharge Condition: Stable Unable to obtain vitals Reason: limited time Ambulatory Status: Wheelchair Discharge Destination: Home Transportation: Private Auto Accompanied By: self Schedule Follow-up Appointment: Yes Clinical Summary of Care: Electronic Signature(s) Signed: 05/14/2020 3:13:39 PM By: Shawn Hayes, BSN, RN, CWS, Kim RN, BSN Entered By: Shawn Hayes, BSN, RN, CWS, Shawn Hayes on 05/14/2020 15:13:39 Shawn Hayes ([id number]) -------------------------------------------------------------------------------- Lower Extremity Assessment Details Patient Name: Shawn Hayes. Date of Service: 05/14/2020 1:45 PM Medical Record Number: [id number] Patient Account Number: [id number] Date of Birth/Sex: 09-Dec-1955 (65 y.o. M) Treating RN: Shawn Hayes Primary Care Naama Sappington: Shawn Hayes Other Clinician: Jeanine Hayes Referring Shawn Hayes: Shawn Hayes Treating Landree Fernholz/Extender: Shawn Hayes in Treatment: 2 Edema Assessment Assessed: [Left: No] [Right: No] Edema: [Left: N] [Right: o] Vascular Assessment Pulses: Dorsalis Pedis Palpable: [Left:Yes] Notes BKA on right Electronic Signature(s) Signed: 05/15/2020 5:20:30 PM By: Shawn Hayes, BSN, RN, CWS, Kim RN, BSN Entered By: Shawn Hayes, BSN, RN, CWS, Shawn Hayes on 05/14/2020 14:23:15 Shawn Hayes ([id number]) -------------------------------------------------------------------------------- Multi Wound Chart Details Patient Name: Shawn Hayes. Date of Service: 05/14/2020 1:45 PM Medical Record Number: [id number] Patient Account Number: [id number] Date of Birth/Sex: March 25, 1955 (65 y.o. M) Treating RN: Shawn Hayes Primary Care Verdis Bassette: Shawn Hayes Other Clinician: Jeanine Hayes Referring Jakori Burkett: Shawn Hayes Treating Shawn Hayes/Extender: Shawn Hayes in Treatment: 2 Vital Signs Height(in): Pulse(bpm):  33 Weight(lbs): Blood Pressure(mmHg): 158/79 Body Mass Index(BMI): Temperature(F): 97.7 Respiratory Rate(breaths/min): 18 Photos: [1:No Photos] [2:No Photos] [3:No Photos] Wound Location: [1:Right Amputation Site - Below Knee Left, Medial Amputation Site -] [2:Transmetatarsal] [3:Left, Lateral Amputation Site - Transmetatarsal] Wounding Event: [1:Surgical Injury] [2:Surgical Injury] [3:Surgical Injury] Primary Etiology: [1:Dehisced Wound] [2:Dehisced Wound] [3:Dehisced Wound] Date Acquired: [1:02/27/2020] [2:01/28/2020] [3:01/28/2020] Hayes of Treatment: [1:2] [2:2] [3:2] Wound Status: [1:Healed - Epithelialized] [2:Open] [3:Open] Measurements L x W x D (cm) [1:0x0x0] [2:0.2x1.5x0.5] [3:0.5x0.7x0.5] Area (cm) : [1:0] [2:0.236] [3:0.275] Volume (cm) : [1:0] [2:0.118] [3:0.137] % Reduction in Area: [1:100.00%] [2:77.70%] [3:80.60%] % Reduction in  Volume: [1:100.00%] [2:72.20%] [3:75.80%] Classification: [1:Full Thickness Without Exposed Support Structures] [2:Full Thickness Without Exposed Support Structures] [3:Full Thickness Without Exposed Support Structures] Treatment Notes Electronic Signature(s) Signed: 05/14/2020 3:04:11 PM By: Shawn Hayes, Shawn Breeding RN Entered By: Shawn Hayes, Shawn Hayes on 05/14/2020 15:04:10 Shawn Hayes ([id number]) -------------------------------------------------------------------------------- Webb Details Patient Name: Shawn Hayes. Date of Service: 05/14/2020 1:45 PM Medical Record Number: [id number] Patient Account Number: [id number] Date of Birth/Sex: Jan 24, 1956 (65 y.o. M) Treating RN: Shawn Hayes Primary Care Shawn Hayes: Shawn Hayes Other Clinician: Jeanine Hayes Referring Shawn Hayes: Shawn Hayes Treating Shawn Hayes/Extender: Shawn Hayes in Treatment: 2 Active Inactive Wound/Skin Impairment Nursing Diagnoses: Impaired tissue integrity Goals: Patient/caregiver will verbalize understanding of  skin care regimen Date Initiated: 04/24/2020 Date Inactivated: 05/07/2020 Target Resolution Date: 05/22/2020 Goal Status: Met Ulcer/skin breakdown will have a volume reduction of 30% by week 4 Date Initiated: 04/24/2020 Target Resolution Date: 05/22/2020 Goal Status: Active Ulcer/skin breakdown will have a volume reduction of 50% by week 8 Date Initiated: 04/24/2020 Target Resolution Date: 06/22/2020 Goal Status: Active Ulcer/skin breakdown will have a volume reduction of 80% by week 12 Date Initiated: 04/24/2020 Target Resolution Date: 07/22/2020 Goal Status: Active Interventions: Assess patient/caregiver ability to obtain necessary supplies Assess patient/caregiver ability to perform ulcer/skin care regimen upon admission and as needed Assess ulceration(s) every visit Provide education on ulcer and skin care Treatment Activities: Skin care regimen initiated : 04/24/2020 Notes: Electronic Signature(s) Signed: 05/14/2020 3:04:03 PM By: Charlett Nose RN Signed: 05/14/2020 3:44:01 PM By: Shawn Coria RN Entered By: Shawn Hayes, Shawn Hayes on 05/14/2020 15:04:03 Shawn Hayes ([id number]) -------------------------------------------------------------------------------- Pain Assessment Details Patient Name: Shawn Hayes. Date of Service: 05/14/2020 1:45 PM Medical Record Number: [id number] Patient Account Number: [id number] Date of Birth/Sex: September 16, 1955 (66 y.o. M) Treating RN: Shawn Hayes Primary Care Randee Upchurch: Shawn Hayes Other Clinician: Jeanine Hayes Referring Shareta Fishbaugh: Shawn Hayes Treating Shonda Mandarino/Extender: Shawn Hayes in Treatment: 2 Active Problems Location of Pain Severity and Description of Pain Patient Has Paino No Site Locations Pain Management and Medication Current Pain Management: Notes Patient denies pain at this time. Electronic Signature(s) Signed: 05/15/2020 5:20:30 PM By: Shawn Hayes, BSN, RN, CWS, Kim RN, BSN Entered By: Shawn Hayes, BSN,  RN, CWS, Shawn Hayes on 05/14/2020 14:10:37 Shawn Hayes ([id number]) -------------------------------------------------------------------------------- Patient/Caregiver Education Details Patient Name: Shawn Hayes, Shawn Hayes Date of Service: 05/14/2020 1:45 PM Medical Record Number: [id number] Patient Account Number: [id number] Date of Birth/Gender: [birth year] (66 y.o. M) Treating RN: Shawn Hayes Primary Care Physician: Shawn Hayes Other Clinician: Jeanine Hayes Referring Physician: Frazier Hayes Treating Physician/Extender: Shawn Hayes in Treatment: 2 Education Assessment Education Provided To: Patient Education Topics Provided Wound/Skin Impairment: Handouts: Caring for Your Ulcer Methods: Demonstration, Explain/Verbal Responses: State content correctly Electronic Signature(s) Signed: 05/15/2020 5:20:30 PM By: Shawn Hayes, BSN, RN, CWS, Kim RN, BSN Entered By: Shawn Hayes, BSN, RN, CWS, Shawn Hayes on 05/14/2020 15:12:49 Shawn Hayes ([id number]) -------------------------------------------------------------------------------- Wound Assessment Details Patient Name: Shawn Hayes Date of Service: 05/14/2020 1:45 PM Medical Record Number: [id number] Patient Account Number: [id number] Date of Birth/Sex: 10-13-1955 (65 y.o. M) Treating RN: Shawn Hayes Primary Care Hildreth Robart: Shawn Hayes Other Clinician: Jeanine Hayes Referring Damani Rando: Shawn Hayes Treating Karletta Millay/Extender: Shawn Hayes in Treatment: 2 Wound Status Wound Number: 1 Primary Etiology: Dehisced Wound Wound Location: Right Amputation Site - Below Knee Wound Status: Healed - Epithelialized Wounding Event: Surgical Injury Date Acquired: 02/27/2020 Hayes Of Treatment: 2 Clustered Wound: No Wound Measurements Length: (cm) Width: (cm) Depth: (cm) Area: (cm) Volume: (cm) 0 % Reduction  in Area: 100% 0 % Reduction in Volume: 100% 0 0 0 Wound Description Classification: Full Thickness  Without Exposed Support Structu res Treatment Notes Wound #1 (Amputation Site - Below Knee) Wound Laterality: Right Cleanser Peri-Wound Care Topical Primary Dressing Secondary Dressing Secured With Compression Wrap Compression Stockings Add-Ons Electronic Signature(s) Signed: 05/15/2020 5:20:30 PM By: Shawn Hayes, BSN, RN, CWS, Kim RN, BSN Entered By: Shawn Hayes, BSN, RN, CWS, Shawn Hayes on 05/14/2020 14:28:05 Shawn Hayes ([id number]) -------------------------------------------------------------------------------- Wound Assessment Details Patient Name: Shawn Hayes. Date of Service: 05/14/2020 1:45 PM Medical Record Number: [id number] Patient Account Number: [id number] Date of Birth/Sex: 14-Mar-1955 (66 y.o. M) Treating RN: Shawn Hayes Primary Care Evalina Tabak: Shawn Hayes Other Clinician: Jeanine Hayes Referring Amire Gossen: Shawn Hayes Treating Laylonie Marzec/Extender: Shawn Hayes in Treatment: 2 Wound Status Wound Number: 2 Primary Etiology: Dehisced Wound Wound Location: Left, Medial Amputation Site - Transmetatarsal Wound Status: Open Wounding Event: Surgical Injury Date Acquired: 01/28/2020 Hayes Of Treatment: 2 Clustered Wound: No Wound Measurements Length: (cm) 0.2 Width: (cm) 1.5 Depth: (cm) 0.5 Area: (cm) 0.236 Volume: (cm) 0.118 % Reduction in Area: 77.7% % Reduction in Volume: 72.2% Wound Description Classification: Full Thickness Without Exposed Support Structu res Treatment Notes Wound #2 (Amputation Site - Transmetatarsal) Wound Laterality: Left, Medial Cleanser Normal Saline Discharge Instruction: Wash your hands with soap and water. Remove old dressing, discard into plastic bag and place into trash. Cleanse the wound with Normal Saline prior to applying a clean dressing using gauze sponges, not tissues or cotton balls. Do not scrub or use excessive force. Pat dry using gauze sponges, not tissue or cotton balls. Peri-Wound Care Topical Primary  Dressing Prisma 4.34 (in) Discharge Instruction: Do not moisten, apply directly to wound bed Secondary Dressing Mepilex Border Flex, 4x4 (in/in) Discharge Instruction: Apply to wound as directed. Do not cut. Secured With Compression Wrap Compression Stockings Environmental education officer) Signed: 05/15/2020 5:20:30 PM By: Shawn Hayes, BSN, RN, CWS, Kim RN, BSN Entered By: Shawn Hayes, BSN, RN, CWS, Shawn Hayes on 05/14/2020 14:35:13 Shawn Hayes ([id number]) -------------------------------------------------------------------------------- Wound Assessment Details Patient Name: Shawn Hayes, Shawn Hayes. Date of Service: 05/14/2020 1:45 PM Medical Record Number: [id number] Patient Account Number: [id number] Date of Birth/Sex: [birth year] (65 y.o. M) Treating RN: Shawn Hayes Primary Care Ofilia Rayon: Shawn Hayes Other Clinician: Jeanine Hayes Referring Zhara Gieske: Shawn Hayes Treating Zurii Hewes/Extender: Shawn Hayes in Treatment: 2 Wound Status Wound Number: 3 Primary Etiology: Dehisced Wound Wound Location: Left, Lateral Amputation Site - Transmetatarsal Wound Status: Open Wounding Event: Surgical Injury Date Acquired: 01/28/2020 Hayes Of Treatment: 2 Clustered Wound: No Wound Measurements Length: (cm) 0.5 Width: (cm) 0.7 Depth: (cm) 0.5 Area: (cm) 0.275 Volume: (cm) 0.137 % Reduction in Area: 80.6% % Reduction in Volume: 75.8% Wound Description Classification: Full Thickness Without Exposed Support Structu res Treatment Notes Wound #3 (Amputation Site - Transmetatarsal) Wound Laterality: Left, Lateral Cleanser Normal Saline Discharge Instruction: Wash your hands with soap and water. Remove old dressing, discard into plastic bag and place into trash. Cleanse the wound with Normal Saline prior to applying a clean dressing using gauze sponges, not tissues or cotton balls. Do not scrub or use excessive force. Pat dry using gauze sponges, not tissue or cotton balls. Peri-Wound  Care Topical Primary Dressing Prisma 4.34 (in) Discharge Instruction: Moisten w/normal saline or sterile water; Cover wound as directed. Do not remove from wound bed. Secondary Dressing Mepilex Border Flex, 4x4 (in/in) Discharge Instruction: Apply to wound as directed. Do not cut. Secured With Compression Wrap Compression Stockings Environmental education officer) Signed: 05/15/2020  5:20:30 PM By: Shawn Hayes, BSN, RN, CWS, Kim RN, BSN Entered By: Shawn Hayes, BSN, RN, CWS, Shawn Hayes on 05/14/2020 14:35:14 Shawn Hayes ([id number]) -------------------------------------------------------------------------------- [place] Details Patient Name: Shawn Hayes Date of Service: 05/14/2020 1:45 PM Medical Record Number: [id number] Patient Account Number: [id number] Date of Birth/Sex: 19-Dec-1955 (65 y.o. M) Treating RN: Shawn Hayes Primary Care Shenell Rogalski: Shawn Hayes Other Clinician: Jeanine Hayes Referring Ellissa Ayo: Shawn Hayes Treating Jory Tanguma/Extender: Shawn Hayes in Treatment: 2 Vital Signs Time Taken: 14:08 Temperature (F): 97.7 Pulse (bpm): 64 Respiratory Rate (breaths/min): 18 Blood Pressure (mmHg): 158/79 Reference Range: 80 - 120 mg / dl Electronic Signature(s) Signed: 05/14/2020 4:39:06 PM By: Shawn Hayes RCP, RRT, CHT Entered By: Shawn Hayes on 05/14/2020 14:09:55

## 2020-05-15 DIAGNOSIS — I5020 Unspecified systolic (congestive) heart failure: Secondary | ICD-10-CM | POA: Diagnosis not present

## 2020-05-15 DIAGNOSIS — Z0000 Encounter for general adult medical examination without abnormal findings: Secondary | ICD-10-CM | POA: Diagnosis not present

## 2020-05-15 DIAGNOSIS — I429 Cardiomyopathy, unspecified: Secondary | ICD-10-CM | POA: Diagnosis not present

## 2020-05-15 DIAGNOSIS — N183 Chronic kidney disease, stage 3 unspecified: Secondary | ICD-10-CM | POA: Diagnosis not present

## 2020-05-15 DIAGNOSIS — J42 Unspecified chronic bronchitis: Secondary | ICD-10-CM | POA: Diagnosis not present

## 2020-05-15 DIAGNOSIS — I110 Hypertensive heart disease with heart failure: Secondary | ICD-10-CM | POA: Diagnosis not present

## 2020-05-15 DIAGNOSIS — Z89511 Acquired absence of right leg below knee: Secondary | ICD-10-CM | POA: Diagnosis not present

## 2020-05-15 DIAGNOSIS — Z23 Encounter for immunization: Secondary | ICD-10-CM | POA: Diagnosis not present

## 2020-05-15 DIAGNOSIS — E1122 Type 2 diabetes mellitus with diabetic chronic kidney disease: Secondary | ICD-10-CM | POA: Diagnosis not present

## 2020-05-15 DIAGNOSIS — Z4781 Encounter for orthopedic aftercare following surgical amputation: Secondary | ICD-10-CM | POA: Diagnosis not present

## 2020-05-15 DIAGNOSIS — I700 Atherosclerosis of aorta: Secondary | ICD-10-CM | POA: Diagnosis not present

## 2020-05-22 ENCOUNTER — Other Ambulatory Visit: Payer: Self-pay

## 2020-05-22 ENCOUNTER — Encounter: Payer: Medicare HMO | Admitting: Physician Assistant

## 2020-05-22 DIAGNOSIS — E11621 Type 2 diabetes mellitus with foot ulcer: Secondary | ICD-10-CM | POA: Diagnosis not present

## 2020-05-22 DIAGNOSIS — I110 Hypertensive heart disease with heart failure: Secondary | ICD-10-CM | POA: Diagnosis not present

## 2020-05-22 DIAGNOSIS — L97812 Non-pressure chronic ulcer of other part of right lower leg with fat layer exposed: Secondary | ICD-10-CM | POA: Diagnosis not present

## 2020-05-22 DIAGNOSIS — T8131XA Disruption of external operation (surgical) wound, not elsewhere classified, initial encounter: Secondary | ICD-10-CM | POA: Diagnosis not present

## 2020-05-22 DIAGNOSIS — L97522 Non-pressure chronic ulcer of other part of left foot with fat layer exposed: Secondary | ICD-10-CM | POA: Diagnosis not present

## 2020-05-22 DIAGNOSIS — Z89511 Acquired absence of right leg below knee: Secondary | ICD-10-CM | POA: Diagnosis not present

## 2020-05-22 DIAGNOSIS — I5042 Chronic combined systolic (congestive) and diastolic (congestive) heart failure: Secondary | ICD-10-CM | POA: Diagnosis not present

## 2020-05-22 NOTE — Progress Notes (Addendum)
ZAKYE, ARNALL ([id number]) Visit Report for 05/22/2020 Chief Complaint Document Details Patient Name: Shawn Hayes, Shawn Hayes. Date of Service: 05/22/2020 12:30 PM Medical Record Number: [id number] Patient Account Number: [id number] Date of Birth/Sex: [birth year] (65 y.o. M) Treating RN: Carlene Coria Primary Care Provider: Frazier Richards Other Clinician: Referring Provider: Frazier Richards Treating Provider/Extender: Skipper Cliche in Treatment: 4 Information Obtained from: Patient Chief Complaint Right BKA and left foot ulcers Electronic Signature(s) Signed: 05/22/2020 12:46:37 PM By: Worthy Keeler PA-C Entered By: Worthy Keeler on 05/22/2020 12:46:37 Shawn Hayes ([id number]) -------------------------------------------------------------------------------- Debridement Details Patient Name: Shawn Hayes Date of Service: 05/22/2020 12:30 PM Medical Record Number: [id number] Patient Account Number: [id number] Date of Birth/Sex: 12-Jan-1956 (65 y.o. M) Treating RN: Carlene Coria Primary Care Provider: Frazier Richards Other Clinician: Referring Provider: Frazier Richards Treating Provider/Extender: Skipper Cliche in Treatment: 4 Debridement Performed for Wound #2 Left,Medial Amputation Site - Transmetatarsal Assessment: Performed By: Physician Tommie Sams., PA-C Debridement Type: Debridement Level of Consciousness (Pre- Awake and Alert procedure): Pre-procedure Verification/Time Out Yes - 13:00 Taken: Start Time: 13:00 Pain Control: Lidocaine 4% Topical Solution Total Area Debrided (L x W): 0.5 (cm) x 1.8 (cm) = 0.9 (cm) Tissue and other material Viable, Non-Viable, Callus, Skin: Dermis , Skin: Epidermis debrided: Level: Skin/Epidermis Debridement Description: Selective/Open Wound Instrument: Curette Bleeding: Minimum Hemostasis Achieved: Pressure End Time: 13:06 Procedural Pain: 0 Post Procedural Pain: 0 Response to Treatment: Procedure was  tolerated well Level of Consciousness (Post- Awake and Alert procedure): Post Debridement Measurements of Total Wound Length: (cm) 0.3 Width: (cm) 0.1 Depth: (cm) 0.2 Volume: (cm) 0.005 Character of Wound/Ulcer Post Debridement: Improved Post Procedure Diagnosis Same as Pre-procedure Electronic Signature(s) Signed: 05/22/2020 2:05:44 PM By: Worthy Keeler PA-C Signed: 05/25/2020 5:08:01 PM By: Carlene Coria RN Entered By: Carlene Coria on 05/22/2020 13:08:04 Shawn Hayes ([id number]) -------------------------------------------------------------------------------- HPI Details Patient Name: Shawn Hayes. Date of Service: 05/22/2020 12:30 PM Medical Record Number: [id number] Patient Account Number: [id number] Date of Birth/Sex: May 12, 1955 (65 y.o. M) Treating RN: Carlene Coria Primary Care Provider: Frazier Richards Other Clinician: Referring Provider: Frazier Richards Treating Provider/Extender: Skipper Cliche in Treatment: 4 History of Present Illness HPI Description: 04/24/2020 upon evaluation today patient presents for initial evaluation here in our clinic concerning issues that he has been having with wound dehiscence in regard to his below-knee amputation on the right as well as transmetatarsal amputation on the left. Fortunately this does not appear to be doing too badly which is good news. There is no signs of active infection at this time. I do think he is going require little bit of sharp debridement here but fortunately overall I think he is actually managing quite nicely in a bad situation. The initial below-knee amputation was in January on the right. His transmetatarsal amputation was in November 2021. The patient states that obviously his lifestyle has changed dramatically over the past several months. He seems to be in fairly good spirits all things considered. He does have a history of diabetes mellitus type 2. 05/01/2020 upon evaluation today patient appears  to be doing excellent in regard to his wounds. There again will require some sharp debridement to clear away some of the slough today but overall I feel like he is making good progress. Fortunately there does not appear to be any evidence of active infection which is great news. No fevers, chills, nausea, vomiting, or diarrhea. 05/07/2020 upon evaluation today patient appears to be doing well with regard to his  wounds in general. All are going require little bit of debridement on the left the right side not really as much although there was a suture pushing out along one of the incision lines. Nonetheless I think this is going to come right out. Fortunately there is no evidence of infection at any site which is great news. 05/14/2020 on evaluation today patient actually appears to be making great progress. In fact his wound on the right stump is actually completely healed. The left foot amputation site has 2 small areas still open there is a lot of drainage that is going to have to be removed but seems to be doing better. 05/22/2020 upon evaluation today patient appears to be doing excellent in regard to his wounds. He does have a lot of dressing buildup on the surface of the wound 7 have to remove this in order to see where things stand. Patient will there is no evidence of active infection. Electronic Signature(s) Signed: 05/22/2020 1:11:31 PM By: Worthy Keeler PA-C Entered By: Worthy Keeler on 05/22/2020 13:11:31 Shawn Hayes ([id number]) -------------------------------------------------------------------------------- Physical Exam Details Patient Name: Shawn Hayes Date of Service: 05/22/2020 12:30 PM Medical Record Number: [id number] Patient Account Number: [id number] Date of Birth/Sex: 1955-03-09 (65 y.o. M) Treating RN: Carlene Coria Primary Care Provider: Frazier Richards Other Clinician: Referring Provider: Frazier Richards Treating Provider/Extender: Skipper Cliche in  Treatment: 4 Constitutional Well-nourished and well-hydrated in no acute distress. Respiratory normal breathing without difficulty. Psychiatric this patient is able to make decisions and demonstrates good insight into disease process. Alert and Oriented x 3. pleasant and cooperative. Notes Upon inspection patient's wound bed actually showed signs of good granulation epithelization I did remove from the right foot the dressing material and some callus he tolerated that without complication post debridement wound bed appears to be doing much better and is extremely small. In regard to the left foot this is showing signs of actually being completely healed once I remove the dressing there is nothing open here. Electronic Signature(s) Signed: 05/22/2020 1:12:06 PM By: Worthy Keeler PA-C Entered By: Worthy Keeler on 05/22/2020 13:12:05 Shawn Hayes ([id number]) -------------------------------------------------------------------------------- Physician Orders Details Patient Name: Shawn Hayes Date of Service: 05/22/2020 12:30 PM Medical Record Number: [id number] Patient Account Number: [id number] Date of Birth/Sex: 16-Nov-1955 (65 y.o. M) Treating RN: Carlene Coria Primary Care Provider: Frazier Richards Other Clinician: Referring Provider: Frazier Richards Treating Provider/Extender: Skipper Cliche in Treatment: 4 Verbal / Phone Orders: No Diagnosis Coding ICD-10 Coding Code Description E11.621 Type 2 diabetes mellitus with foot ulcer Z89.421 Acquired absence of other right toe(s) L97.522 Non-pressure chronic ulcer of other part of left foot with fat layer exposed Z89.511 Acquired absence of right leg below knee L97.812 Non-pressure chronic ulcer of other part of right lower leg with fat layer exposed I10 Essential (primary) hypertension I50.42 Chronic combined systolic (congestive) and diastolic (congestive) heart failure J44.9 Chronic obstructive pulmonary disease,  unspecified Follow-up Appointments o Return Appointment in 1 week. Bathing/ Shower/ Hygiene o Clean wound with Normal Saline or wound cleanser. Wound Treatment Wound #2 - Amputation Site - Transmetatarsal Wound Laterality: Left, Medial Cleanser: Normal Saline (Generic) 3 x Per Week/30 Days Discharge Instructions: Wash your hands with soap and water. Remove old dressing, discard into plastic bag and place into trash. Cleanse the wound with Normal Saline prior to applying a clean dressing using gauze sponges, not tissues or cotton balls. Do not scrub or use excessive force. Pat dry using gauze sponges,  not tissue or cotton balls. Primary Dressing: Prisma 4.34 (in) (Generic) 3 x Per Week/30 Days Discharge Instructions: Do not moisten, apply directly to wound bed Secondary Dressing: Mepilex Border Flex, 4x4 (in/in) (Generic) 3 x Per Week/30 Days Discharge Instructions: Apply to wound as directed. Do not cut. Electronic Signature(s) Signed: 05/22/2020 2:05:44 PM By: Worthy Keeler PA-C Signed: 05/25/2020 5:08:01 PM By: Carlene Coria RN Entered By: Carlene Coria on 05/22/2020 13:09:32 Shawn Hayes ([id number]) -------------------------------------------------------------------------------- Problem List Details Patient Name: Shawn Hayes, Shawn Hayes. Date of Service: 05/22/2020 12:30 PM Medical Record Number: [id number] Patient Account Number: [id number] Date of Birth/Sex: [birth year] (65 y.o. M) Treating RN: Carlene Coria Primary Care Provider: Frazier Richards Other Clinician: Referring Provider: Frazier Richards Treating Provider/Extender: Skipper Cliche in Treatment: 4 Active Problems ICD-10 Encounter Code Description Active Date MDM Diagnosis E11.621 Type 2 diabetes mellitus with foot ulcer 04/24/2020 No Yes Z89.421 Acquired absence of other right toe(s) 04/24/2020 No Yes L97.522 Non-pressure chronic ulcer of other part of left foot with fat layer 04/24/2020 No Yes exposed Z89.511  Acquired absence of right leg below knee 04/24/2020 No Yes L97.812 Non-pressure chronic ulcer of other part of right lower leg with fat layer 04/24/2020 No Yes exposed [place] (primary) hypertension 04/24/2020 No Yes I50.42 Chronic combined systolic (congestive) and diastolic (congestive) heart 04/24/2020 No Yes failure J44.9 Chronic obstructive pulmonary disease, unspecified 04/24/2020 No Yes Inactive Problems Resolved Problems Electronic Signature(s) Signed: 05/22/2020 12:46:30 PM By: Worthy Keeler PA-C Entered By: Worthy Keeler on 05/22/2020 12:46:30 Shawn Hayes ([id number]) -------------------------------------------------------------------------------- Progress Note Details Patient Name: Shawn Hayes. Date of Service: 05/22/2020 12:30 PM Medical Record Number: [id number] Patient Account Number: [id number] Date of Birth/Sex: 09-12-1955 (65 y.o. M) Treating RN: Carlene Coria Primary Care Provider: Frazier Richards Other Clinician: Referring Provider: Frazier Richards Treating Provider/Extender: Skipper Cliche in Treatment: 4 Subjective Chief Complaint Information obtained from Patient Right BKA and left foot ulcers History of Present Illness (HPI) 04/24/2020 upon evaluation today patient presents for initial evaluation here in our clinic concerning issues that he has been having with wound dehiscence in regard to his below-knee amputation on the right as well as transmetatarsal amputation on the left. Fortunately this does not appear to be doing too badly which is good news. There is no signs of active infection at this time. I do think he is going require little bit of sharp debridement here but fortunately overall I think he is actually managing quite nicely in a bad situation. The initial below-knee amputation was in January on the right. His transmetatarsal amputation was in November 2021. The patient states that obviously his lifestyle has  changed dramatically over the past several months. He seems to be in fairly good spirits all things considered. He does have a history of diabetes mellitus type 2. 05/01/2020 upon evaluation today patient appears to be doing excellent in regard to his wounds. There again will require some sharp debridement to clear away some of the slough today but overall I feel like he is making good progress. Fortunately there does not appear to be any evidence of active infection which is great news. No fevers, chills, nausea, vomiting, or diarrhea. 05/07/2020 upon evaluation today patient appears to be doing well with regard to his wounds in general. All are going require little bit of debridement on the left the right side not really as much although there was a suture pushing out along one of the incision lines. Nonetheless I think this is going  to come right out. Fortunately there is no evidence of infection at any site which is great news. 05/14/2020 on evaluation today patient actually appears to be making great progress. In fact his wound on the right stump is actually completely healed. The left foot amputation site has 2 small areas still open there is a lot of drainage that is going to have to be removed but seems to be doing better. 05/22/2020 upon evaluation today patient appears to be doing excellent in regard to his wounds. He does have a lot of dressing buildup on the surface of the wound 7 have to remove this in order to see where things stand. Patient will there is no evidence of active infection. Objective Constitutional Well-nourished and well-hydrated in no acute distress. Vitals Time Taken: 12:38 PM, Temperature: 98.6 F, Pulse: 78 bpm, Respiratory Rate: 18 breaths/min, Blood Pressure: 148/83 mmHg. Respiratory normal breathing without difficulty. Psychiatric this patient is able to make decisions and demonstrates good insight into disease process. Alert and Oriented x 3. pleasant and  cooperative. General Notes: Upon inspection patient's wound bed actually showed signs of good granulation epithelization I did remove from the right foot the dressing material and some callus he tolerated that without complication post debridement wound bed appears to be doing much better and is extremely small. In regard to the left foot this is showing signs of actually being completely healed once I remove the dressing there is nothing open here. Integumentary (Hair, Skin) Wound #2 status is Open. Original cause of wound was Surgical Injury. The date acquired was: 01/28/2020. The wound has been in treatment 4 weeks. The wound is located on the Left,Medial Amputation Site - Transmetatarsal. The wound measures 0.5cm length x 1.8cm width x 0.5cm depth; 0.707cm^2 area and 0.353cm^3 volume. There is Fat Layer (Subcutaneous Tissue) exposed. There is no tunneling or undermining noted. There is a small amount of serous drainage noted. There is small (1-33%) pink granulation within the wound bed. There is a medium (34-66%) amount of necrotic tissue within the wound bed including Adherent Slough. Shawn Hayes, Shawn Hayes ([id number]) Wound #3 status is Open. Original cause of wound was Surgical Injury. The date acquired was: 01/28/2020. The wound has been in treatment 4 weeks. The wound is located on the Left,Lateral Amputation Site - Transmetatarsal. The wound measures 0cm length x 0cm width x 0cm depth; 0cm^2 area and 0cm^3 volume. There is no tunneling or undermining noted. There is a none present amount of drainage noted. There is no granulation within the wound bed. There is no necrotic tissue within the wound bed. Assessment Active Problems ICD-10 Type 2 diabetes mellitus with foot ulcer Acquired absence of other right toe(s) Non-pressure chronic ulcer of other part of left foot with fat layer exposed Acquired absence of right leg below knee Non-pressure chronic ulcer of other part of right lower leg  with fat layer exposed Essential (primary) hypertension Chronic combined systolic (congestive) and diastolic (congestive) heart failure Chronic obstructive pulmonary disease, unspecified Procedures Wound #2 Pre-procedure diagnosis of Wound #2 is a Dehisced Wound located on the Left,Medial Amputation Site - Transmetatarsal . There was a Selective/Open Wound Skin/Epidermis Debridement with a total area of 0.9 sq cm performed by Tommie Sams., PA-C. With the following instrument(s): Curette to remove Viable and Non-Viable tissue/material. Material removed includes Callus, Skin: Dermis, and Skin: Epidermis after achieving pain control using Lidocaine 4% Topical Solution. No specimens were taken. A time out was conducted at 13:00, prior to the start of  the procedure. A Minimum amount of bleeding was controlled with Pressure. The procedure was tolerated well with a pain level of 0 throughout and a pain level of 0 following the procedure. Post Debridement Measurements: 0.3cm length x 0.1cm width x 0.2cm depth; 0.005cm^3 volume. Character of Wound/Ulcer Post Debridement is improved. Post procedure Diagnosis Wound #2: Same as Pre-Procedure Plan Follow-up Appointments: Return Appointment in 1 week. Bathing/ Shower/ Hygiene: Clean wound with Normal Saline or wound cleanser. WOUND #2: - Amputation Site - Transmetatarsal Wound Laterality: Left, Medial Cleanser: Normal Saline (Generic) 3 x Per Week/30 Days Discharge Instructions: Wash your hands with soap and water. Remove old dressing, discard into plastic bag and place into trash. Cleanse the wound with Normal Saline prior to applying a clean dressing using gauze sponges, not tissues or cotton balls. Do not scrub or use excessive force. Pat dry using gauze sponges, not tissue or cotton balls. Primary Dressing: Prisma 4.34 (in) (Generic) 3 x Per Week/30 Days Discharge Instructions: Do not moisten, apply directly to wound bed Secondary Dressing:  Mepilex Border Flex, 4x4 (in/in) (Generic) 3 x Per Week/30 Days Discharge Instructions: Apply to wound as directed. Do not cut. 1. Would recommend currently that we continue with the collagen for the one remaining area on the medial aspect of the transmetatarsal amputation site of the left foot. The patient is in agreement with that plan begin using silver collagen. 2. I would recommend as well that we just use a very small amount of the collagen then cover with the border foam dressing. 3. I am also can recommend that we continue to monitor for any signs of anything worsening. If anything does change then he should let me know soon as possible. We will see patient back for reevaluation in 1 week here in the clinic. If anything worsens or changes patient will contact our office for additional recommendations. Electronic Signature(s) Shawn Hayes, Shawn Hayes ([id number]) Signed: 05/22/2020 1:12:40 PM By: Worthy Keeler PA-C Entered By: Worthy Keeler on 05/22/2020 13:12:40 Shawn Hayes ([id number]) -------------------------------------------------------------------------------- SuperBill Details Patient Name: Shawn Hayes Date of Service: 05/22/2020 Medical Record Number: [id number] Patient Account Number: [id number] Date of Birth/Sex: 1956-01-02 (65 y.o. M) Treating RN: Carlene Coria Primary Care Provider: Frazier Richards Other Clinician: Referring Provider: Frazier Richards Treating Provider/Extender: Skipper Cliche in Treatment: 4 Diagnosis Coding ICD-10 Codes Code Description E11.621 Type 2 diabetes mellitus with foot ulcer Z89.421 Acquired absence of other right toe(s) L97.522 Non-pressure chronic ulcer of other part of left foot with fat layer exposed Z89.511 Acquired absence of right leg below knee L97.812 Non-pressure chronic ulcer of other part of right lower leg with fat layer exposed I10 Essential (primary) hypertension I50.42 Chronic combined systolic  (congestive) and diastolic (congestive) heart failure J44.9 Chronic obstructive pulmonary disease, unspecified Facility Procedures CPT4 Code: [id number] Description: [phone] - DEBRIDE WOUND 1ST 20 SQ CM OR < Modifier: Quantity: 1 CPT4 Code: Description: ICD-10 Diagnosis Description L97.522 Non-pressure chronic ulcer of other part of left foot with fat layer expo Modifier: sed Quantity: Physician Procedures CPT4 [id number] Description: [id number] - WC PHYS DEBR WO ANESTH 20 SQ CM Modifier: Quantity: 1 CPT4 Code: Description: ICD-10 Diagnosis Description L97.522 Non-pressure chronic ulcer of other part of left foot with fat layer expo Modifier: sed Quantity: Electronic Signature(s) Signed: 05/22/2020 1:12:50 PM By: Worthy Keeler PA-C Entered By: Worthy Keeler on 05/22/2020 13:12:49

## 2020-05-25 NOTE — Progress Notes (Signed)
GENARO, BEKKER ([id number]) Visit Report for 05/22/2020 Arrival Information Details Patient Name: Shawn Hayes, Shawn Hayes. Date of Service: 05/22/2020 12:30 PM Medical Record Number: [id number] Patient Account Number: [id number] Date of Birth/Sex: [birth year] (65 y.o. M) Treating RN: Carlene Coria Primary Care Catharina Pica: Frazier Richards Other Clinician: Referring Casondra Gasca: Frazier Richards Treating Keneth Borg/Extender: Skipper Cliche in Treatment: 4 Visit Information History Since Last Visit Added or deleted any medications: Yes Patient Arrived: Wheel Chair Had a fall or experienced change in No Arrival Time: 12:40 activities of daily living that may affect Accompanied By: daughter in lobby risk of falls: Transfer Assistance: None Hospitalized since last visit: No Patient Identification Verified: Yes Pain Present Now: No Secondary Verification Process Completed: Yes Patient Requires Transmission-Based Precautions: No Patient Has Alerts: Yes Patient Alerts: Type II Diabetic Electronic Signature(s) Signed: 05/22/2020 2:45:39 PM By: Jeanine Luz Entered By: Jeanine Luz on 05/22/2020 12:40:56 Shawn Hayes ([id number]) -------------------------------------------------------------------------------- Clinic Level of Care Assessment Details Patient Name: Shawn Hayes. Date of Service: 05/22/2020 12:30 PM Medical Record Number: [id number] Patient Account Number: [id number] Date of Birth/Sex: June 09, 1955 (65 y.o. M) Treating RN: Carlene Coria Primary Care Sahalie Beth: Frazier Richards Other Clinician: Referring Trevor Wilkie: Frazier Richards Treating Jaonna Word/Extender: Skipper Cliche in Treatment: 4 Clinic Level of Care Assessment Items TOOL 1 Quantity Score []  - Use when EandM and Procedure is performed on INITIAL visit 0 ASSESSMENTS - Nursing Assessment / Reassessment []  - General Physical Exam (combine w/ comprehensive assessment (listed just below) when performed on  new 0 pt. evals) []  - 0 Comprehensive Assessment (HX, ROS, Risk Assessments, Wounds Hx, etc.) ASSESSMENTS - Wound and Skin Assessment / Reassessment []  - Dermatologic / Skin Assessment (not related to wound area) 0 ASSESSMENTS - Ostomy and/or Continence Assessment and Care []  - Incontinence Assessment and Management 0 []  - 0 Ostomy Care Assessment and Management (repouching, etc.) PROCESS - Coordination of Care []  - Simple Patient / Family Education for ongoing care 0 []  - 0 Complex (extensive) Patient / Family Education for ongoing care []  - 0 Staff obtains Programmer, systems, Records, Test Results / Process Orders []  - 0 Staff telephones HHA, Nursing Homes / Clarify orders / etc []  - 0 Routine Transfer to another Facility (non-emergent condition) []  - 0 Routine Hospital Admission (non-emergent condition) []  - 0 New Admissions / Biomedical engineer / Ordering NPWT, Apligraf, etc. []  - 0 Emergency Hospital Admission (emergent condition) PROCESS - Special Needs []  - Pediatric / Minor Patient Management 0 []  - 0 Isolation Patient Management []  - 0 Hearing / Language / Visual special needs []  - 0 Assessment of Community assistance (transportation, D/C planning, etc.) []  - 0 Additional assistance / Altered mentation []  - 0 Support Surface(s) Assessment (bed, cushion, seat, etc.) INTERVENTIONS - Miscellaneous []  - External ear exam 0 []  - 0 Patient Transfer (multiple staff / Civil Service fast streamer / Similar devices) []  - 0 Simple Staple / Suture removal (25 or less) []  - 0 Complex Staple / Suture removal (26 or more) []  - 0 Hypo/Hyperglycemic Management (do not check if billed separately) []  - 0 Ankle / Brachial Index (ABI) - do not check if billed separately Has the patient been seen at the hospital within the last three years: Yes Total Score: 0 Level Of Care: ____ Shawn Hayes ([id number]) Electronic Signature(s) Signed: 05/25/2020 5:08:01 PM By: Carlene Coria RN Entered By:  Carlene Coria on 05/22/2020 13:09:46 Shawn Hayes ([id number]) -------------------------------------------------------------------------------- Encounter Discharge Information Details Patient Name: Shawn Hayes. Date of Service: 05/22/2020 12:30  PM Medical Record Number: [id number] Patient Account Number: [id number] Date of Birth/Sex: May 07, 1955 (65 y.o. M) Treating RN: Dolan Amen Primary Care Nolan Tuazon: Frazier Richards Other Clinician: Referring Emberlin Verner: Frazier Richards Treating Revella Shelton/Extender: Skipper Cliche in Treatment: 4 Encounter Discharge Information Items Post Procedure Vitals Discharge Condition: Stable Temperature (F): 98.6 Ambulatory Status: Wheelchair Pulse (bpm): 78 Discharge Destination: Home Respiratory Rate (breaths/min): 18 Transportation: Private Auto Blood Pressure (mmHg): 148/83 Accompanied By: self Schedule Follow-up Appointment: Yes Clinical Summary of Care: Electronic Signature(s) Signed: 05/22/2020 3:09:36 PM By: Georges Mouse, Minus Breeding RN Entered By: Georges Mouse, Minus Breeding on 05/22/2020 13:20:09 Shawn Hayes ([id number]) -------------------------------------------------------------------------------- Lower Extremity Assessment Details Patient Name: Shawn Hayes. Date of Service: 05/22/2020 12:30 PM Medical Record Number: [id number] Patient Account Number: [id number] Date of Birth/Sex: February 09, 1956 (65 y.o. M) Treating RN: Carlene Coria Primary Care Jentry Warnell: Frazier Richards Other Clinician: Referring Diya Gervasi: Frazier Richards Treating Zyaira Vejar/Extender: Jeri Cos Weeks in Treatment: 4 Electronic Signature(s) Signed: 05/22/2020 2:45:39 PM By: Jeanine Luz Signed: 05/25/2020 5:08:01 PM By: Carlene Coria RN Entered By: Jeanine Luz on 05/22/2020 12:52:48 Shawn Hayes ([id number]) -------------------------------------------------------------------------------- Multi Wound Chart Details Patient Name: Shawn Hayes, Shawn Hayes. Date of Service: 05/22/2020 12:30 PM Medical Record Number: [id number] Patient Account Number: [id number] Date of Birth/Sex: 08-22-1955 (65 y.o. M) Treating RN: Carlene Coria Primary Care Ej Pinson: Frazier Richards Other Clinician: Referring Shmiel Morton: Frazier Richards Treating Felder Lebeda/Extender: Skipper Cliche in Treatment: 4 Vital Signs Height(in): Pulse(bpm): 11 Weight(lbs): Blood Pressure(mmHg): 148/83 Body Mass Index(BMI): Temperature(F): 98.6 Respiratory Rate(breaths/min): 18 Photos: [N/A:N/A] Wound Location: Left, Medial Amputation Site - Left, Lateral Amputation Site - N/A Transmetatarsal Transmetatarsal Wounding Event: Surgical Injury Surgical Injury N/A Primary Etiology: Dehisced Wound Dehisced Wound N/A Comorbid History: Congestive Heart Failure, Type II Congestive Heart Failure, Type II N/A Diabetes Diabetes Date Acquired: 01/28/2020 01/28/2020 N/A Weeks of Treatment: 4 4 N/A Wound Status: Open Open N/A Measurements L x W x D (cm) 0.5x1.8x0.5 0x0x0 N/A Area (cm) : 0.707 0 N/A Volume (cm) : 0.353 0 N/A % Reduction in Area: 33.30% 100.00% N/A % Reduction in Volume: 16.70% 100.00% N/A Classification: Full Thickness Without Exposed Full Thickness Without Exposed N/A Support Structures Support Structures Exudate Amount: Small None Present N/A Exudate Type: Serous N/A N/A Exudate Color: amber N/A N/A Granulation Amount: Small (1-33%) None Present (0%) N/A Granulation Quality: Pink N/A N/A Necrotic Amount: Medium (34-66%) None Present (0%) N/A Exposed Structures: Fat Layer (Subcutaneous Tissue): Fascia: No N/A Yes Fat Layer (Subcutaneous Tissue): Fascia: No No Tendon: No Tendon: No Muscle: No Muscle: No Joint: No Joint: No Bone: No Bone: No Epithelialization: N/A Large (67-100%) N/A Debridement: Debridement - Selective/Open N/A N/A Wound Pre-procedure Verification/Time 13:00 N/A N/A Out Taken: Pain Control: Lidocaine 4% Topical Solution  N/A N/A Tissue Debrided: Callus N/A N/A Level: Skin/Epidermis N/A N/A Debridement Area (sq cm): 0.9 N/A N/A Instrument: Curette N/A N/A Bleeding: Minimum N/A N/A Hemostasis Achieved: Pressure N/A N/A Procedural Pain: 0 N/A N/A Post Procedural Pain: 0 N/A N/A Shawn Hayes, Shawn Hayes ([id number]) Debridement Treatment Procedure was tolerated well N/A N/A Response: Post Debridement 0.3x0.1x0.2 N/A N/A Measurements L x W x D (cm) Post Debridement Volume: 0.005 N/A N/A (cm) Procedures Performed: Debridement N/A N/A Treatment Notes Electronic Signature(s) Signed: 05/25/2020 5:08:01 PM By: Carlene Coria RN Entered By: Carlene Coria on 05/22/2020 13:09:05 Shawn Hayes ([id number]) -------------------------------------------------------------------------------- [place] Details Patient Name: Shawn Hayes. Date of Service: 05/22/2020 12:30 PM Medical Record Number: [id number] Patient Account Number: [id number] Date of Birth/Sex: 17-Feb-1956 (64 y.o.  M) Treating RN: Carlene Coria Primary Care Zared Knoth: Frazier Richards Other Clinician: Referring Jomel Whittlesey: Frazier Richards Treating Yarely Bebee/Extender: Skipper Cliche in Treatment: 4 Active Inactive Wound/Skin Impairment Nursing Diagnoses: Impaired tissue integrity Goals: Patient/caregiver will verbalize understanding of skin care regimen Date Initiated: 04/24/2020 Date Inactivated: 05/07/2020 Target Resolution Date: 05/22/2020 Goal Status: Met Ulcer/skin breakdown will have a volume reduction of 30% by week 4 Date Initiated: 04/24/2020 Date Inactivated: 05/22/2020 Target Resolution Date: 05/22/2020 Goal Status: Met Ulcer/skin breakdown will have a volume reduction of 50% by week 8 Date Initiated: 04/24/2020 Target Resolution Date: 06/22/2020 Goal Status: Active Ulcer/skin breakdown will have a volume reduction of 80% by week 12 Date Initiated: 04/24/2020 Target Resolution Date: 07/22/2020 Goal Status:  Active Interventions: Assess patient/caregiver ability to obtain necessary supplies Assess patient/caregiver ability to perform ulcer/skin care regimen upon admission and as needed Assess ulceration(s) every visit Provide education on ulcer and skin care Treatment Activities: Skin care regimen initiated : 04/24/2020 Notes: Electronic Signature(s) Signed: 05/25/2020 5:08:01 PM By: Carlene Coria RN Entered By: Carlene Coria on 05/22/2020 13:08:57 Shawn Hayes ([id number]) -------------------------------------------------------------------------------- Pain Assessment Details Patient Name: Shawn Hayes. Date of Service: 05/22/2020 12:30 PM Medical Record Number: [id number] Patient Account Number: [id number] Date of Birth/Sex: [birth year] (65 y.o. M) Treating RN: Carlene Coria Primary Care Storie Heffern: Frazier Richards Other Clinician: Referring Marq Rebello: Frazier Richards Treating Felisa Zechman/Extender: Skipper Cliche in Treatment: 4 Active Problems Location of Pain Severity and Description of Pain Patient Has Paino No Site Locations Pain Management and Medication Current Pain Management: Electronic Signature(s) Signed: 05/22/2020 2:45:39 PM By: Jeanine Luz Signed: 05/25/2020 5:08:01 PM By: Carlene Coria RN Entered By: Jeanine Luz on 05/22/2020 12:41:27 Shawn Hayes ([id number]) -------------------------------------------------------------------------------- Patient/Caregiver Education Details Patient Name: Shawn Hayes, Shawn Hayes. Date of Service: 05/22/2020 12:30 PM Medical Record Number: [id number] Patient Account Number: [id number] Date of Birth/Gender: Feb 24, 1956 (65 y.o. M) Treating RN: Carlene Coria Primary Care Physician: Frazier Richards Other Clinician: Referring Physician: Frazier Richards Treating Physician/Extender: Skipper Cliche in Treatment: 4 Education Assessment Education Provided To: Patient Education Topics Provided Wound/Skin  Impairment: Methods: Explain/Verbal Responses: State content correctly Electronic Signature(s) Signed: 05/25/2020 5:08:01 PM By: Carlene Coria RN Entered By: Carlene Coria on 05/22/2020 13:10:05 Shawn Hayes ([id number]) -------------------------------------------------------------------------------- Wound Assessment Details Patient Name: Shawn Hayes. Date of Service: 05/22/2020 12:30 PM Medical Record Number: [id number] Patient Account Number: [id number] Date of Birth/Sex: [birth year] (65 y.o. M) Treating RN: Carlene Coria Primary Care Leonette Tischer: Frazier Richards Other Clinician: Referring Jaria Conway: Frazier Richards Treating Micky Overturf/Extender: Skipper Cliche in Treatment: 4 Wound Status Wound Number: 2 Primary Etiology: Dehisced Wound Wound Location: Left, Medial Amputation Site - Transmetatarsal Wound Status: Open Wounding Event: Surgical Injury Comorbid History: Congestive Heart Failure, Type II Diabetes Date Acquired: 01/28/2020 Weeks Of Treatment: 4 Clustered Wound: No Photos Wound Measurements Length: (cm) 0.5 Width: (cm) 1.8 Depth: (cm) 0.5 Area: (cm) 0.707 Volume: (cm) 0.353 % Reduction in Area: 33.3% % Reduction in Volume: 16.7% Tunneling: No Undermining: No Wound Description Classification: Full Thickness Without Exposed Support Structures Exudate Amount: Small Exudate Type: Serous Exudate Color: amber Foul Odor After Cleansing: No Slough/Fibrino Yes Wound Bed Granulation Amount: Small (1-33%) Exposed Structure Granulation Quality: Pink Fascia Exposed: No Necrotic Amount: Medium (34-66%) Fat Layer (Subcutaneous Tissue) Exposed: Yes Necrotic Quality: Adherent Slough Tendon Exposed: No Muscle Exposed: No Joint Exposed: No Bone Exposed: No Treatment Notes Wound #2 (Amputation Site - Transmetatarsal) Wound Laterality: Left, Medial Cleanser Normal Saline Discharge Instruction: Wash your hands with soap and water. Remove  old dressing,  discard into plastic bag and place into trash. Cleanse the wound with Normal Saline prior to applying a clean dressing using gauze sponges, not tissues or cotton balls. Do not scrub or use excessive force. Pat dry using gauze sponges, not tissue or cotton balls. Shawn Hayes, Shawn Hayes ([id number]) Peri-Wound Care Topical Primary Dressing Prisma 4.34 (in) Discharge Instruction: Do not moisten, apply directly to wound bed Secondary Dressing Mepilex Border Flex, 4x4 (in/in) Discharge Instruction: Apply to wound as directed. Do not cut. Secured With Compression Wrap Compression Stockings Environmental education officer) Signed: 05/22/2020 2:45:39 PM By: Jeanine Luz Signed: 05/25/2020 5:08:01 PM By: Carlene Coria RN Entered By: Jeanine Luz on 05/22/2020 12:50:36 Shawn Hayes ([id number]) -------------------------------------------------------------------------------- Wound Assessment Details Patient Name: Shawn Hayes, Shawn Hayes. Date of Service: 05/22/2020 12:30 PM Medical Record Number: [id number] Patient Account Number: [id number] Date of Birth/Sex: 03-Dec-1955 (65 y.o. M) Treating RN: Carlene Coria Primary Care Keslee Harrington: Frazier Richards Other Clinician: Referring Nadirah Socorro: Frazier Richards Treating Sudiksha Victor/Extender: Skipper Cliche in Treatment: 4 Wound Status Wound Number: 3 Primary Etiology: Dehisced Wound Wound Location: Left, Lateral Amputation Site - Transmetatarsal Wound Status: Open Wounding Event: Surgical Injury Comorbid History: Congestive Heart Failure, Type II Diabetes Date Acquired: 01/28/2020 Weeks Of Treatment: 4 Clustered Wound: No Photos Wound Measurements Length: (cm) 0 Width: (cm) 0 Depth: (cm) 0 Area: (cm) Volume: (cm) % Reduction in Area: 100% % Reduction in Volume: 100% Epithelialization: Large (67-100%) 0 Tunneling: No 0 Undermining: No Wound Description Classification: Full Thickness Without Exposed Support Structures Exudate Amount:  None Present Foul Odor After Cleansing: No Slough/Fibrino No Wound Bed Granulation Amount: None Present (0%) Exposed Structure Necrotic Amount: None Present (0%) Fascia Exposed: No Fat Layer (Subcutaneous Tissue) Exposed: No Tendon Exposed: No Muscle Exposed: No Joint Exposed: No Bone Exposed: No Electronic Signature(s) Signed: 05/25/2020 5:08:01 PM By: Carlene Coria RN Entered By: Carlene Coria on 05/22/2020 13:08:32 Shawn Hayes ([id number]) -------------------------------------------------------------------------------- [place] Details Patient Name: Shawn Hayes. Date of Service: 05/22/2020 12:30 PM Medical Record Number: [id number] Patient Account Number: [id number] Date of Birth/Sex: 10-24-1955 (65 y.o. M) Treating RN: Carlene Coria Primary Care [place]: Frazier Richards Other Clinician: Referring Dillie Burandt: Frazier Richards Treating Hydee Fleece/Extender: Skipper Cliche in Treatment: 4 Vital Signs Time Taken: 12:38 Temperature (F): 98.6 Pulse (bpm): 78 Respiratory Rate (breaths/min): 18 Blood Pressure (mmHg): 148/83 Reference Range: 80 - 120 mg / dl Electronic Signature(s) Signed: 05/22/2020 2:45:39 PM By: Jeanine Luz Entered By: Jeanine Luz on 05/22/2020 12:41:22

## 2020-05-29 ENCOUNTER — Encounter: Payer: Medicare HMO | Attending: Physician Assistant | Admitting: Physician Assistant

## 2020-05-29 ENCOUNTER — Other Ambulatory Visit: Payer: Self-pay

## 2020-05-29 DIAGNOSIS — I110 Hypertensive heart disease with heart failure: Secondary | ICD-10-CM | POA: Diagnosis not present

## 2020-05-29 DIAGNOSIS — E11621 Type 2 diabetes mellitus with foot ulcer: Secondary | ICD-10-CM | POA: Diagnosis not present

## 2020-05-29 DIAGNOSIS — L97522 Non-pressure chronic ulcer of other part of left foot with fat layer exposed: Secondary | ICD-10-CM | POA: Diagnosis not present

## 2020-05-29 DIAGNOSIS — Z89511 Acquired absence of right leg below knee: Secondary | ICD-10-CM | POA: Insufficient documentation

## 2020-05-29 DIAGNOSIS — E1151 Type 2 diabetes mellitus with diabetic peripheral angiopathy without gangrene: Secondary | ICD-10-CM | POA: Insufficient documentation

## 2020-05-29 DIAGNOSIS — I5042 Chronic combined systolic (congestive) and diastolic (congestive) heart failure: Secondary | ICD-10-CM | POA: Insufficient documentation

## 2020-05-29 DIAGNOSIS — L97812 Non-pressure chronic ulcer of other part of right lower leg with fat layer exposed: Secondary | ICD-10-CM | POA: Diagnosis not present

## 2020-05-29 DIAGNOSIS — S91312A Laceration without foreign body, left foot, initial encounter: Secondary | ICD-10-CM | POA: Diagnosis not present

## 2020-05-29 DIAGNOSIS — T8189XA Other complications of procedures, not elsewhere classified, initial encounter: Secondary | ICD-10-CM | POA: Diagnosis not present

## 2020-05-29 NOTE — Progress Notes (Addendum)
JULUIS, STRATMANN ([id number]) Visit Report for 05/29/2020 Chief Complaint Document Details Patient Name: Shawn Hayes, Shawn Hayes. Date of Service: 05/29/2020 11:00 AM Medical Record Number: [id number] Patient Account Number: [id number] Date of Birth/Sex: 14-Feb-1956 (65 y.o. M) Treating RN: Carlene Coria Primary Care Provider: Frazier Richards Other Clinician: Jeanine Luz Referring Provider: Frazier Richards Treating Provider/Extender: Skipper Cliche in Treatment: 5 Information Obtained from: Patient Chief Complaint Right BKA and left foot ulcers Electronic Signature(s) Signed: 05/29/2020 10:53:09 AM By: Worthy Keeler PA-C Entered By: Worthy Keeler on 05/29/2020 10:53:09 Shawn Hayes ([id number]) -------------------------------------------------------------------------------- HPI Details Patient Name: Shawn Hayes Date of Service: 05/29/2020 11:00 AM Medical Record Number: [id number] Patient Account Number: [id number] Date of Birth/Sex: 1955/07/08 (65 y.o. M) Treating RN: Carlene Coria Primary Care Provider: Frazier Richards Other Clinician: Jeanine Luz Referring Provider: Frazier Richards Treating Provider/Extender: Skipper Cliche in Treatment: 5 History of Present Illness HPI Description: 04/24/2020 upon evaluation today patient presents for initial evaluation here in our clinic concerning issues that he has been having with wound dehiscence in regard to his below-knee amputation on the right as well as transmetatarsal amputation on the left. Fortunately this does not appear to be doing too badly which is good news. There is no signs of active infection at this time. I do think he is going require little bit of sharp debridement here but fortunately overall I think he is actually managing quite nicely in a bad situation. The initial below-knee amputation was in January on the right. His transmetatarsal amputation was in November 2021. The patient states that  obviously his lifestyle has changed dramatically over the past several months. He seems to be in fairly good spirits all things considered. He does have a history of diabetes mellitus type 2. 05/01/2020 upon evaluation today patient appears to be doing excellent in regard to his wounds. There again will require some sharp debridement to clear away some of the slough today but overall I feel like he is making good progress. Fortunately there does not appear to be any evidence of active infection which is great news. No fevers, chills, nausea, vomiting, or diarrhea. 05/07/2020 upon evaluation today patient appears to be doing well with regard to his wounds in general. All are going require little bit of debridement on the left the right side not really as much although there was a suture pushing out along one of the incision lines. Nonetheless I think this is going to come right out. Fortunately there is no evidence of infection at any site which is great news. 05/14/2020 on evaluation today patient actually appears to be making great progress. In fact his wound on the right stump is actually completely healed. The left foot amputation site has 2 small areas still open there is a lot of drainage that is going to have to be removed but seems to be doing better. 05/22/2020 upon evaluation today patient appears to be doing excellent in regard to his wounds. He does have a lot of dressing buildup on the surface of the wound 7 have to remove this in order to see where things stand. Patient will there is no evidence of active infection. 05/29/2020 upon evaluation today patient actually appears to be doing extremely well currently with regard to his wound. Is been tolerating the dressing changes without complication which is great news. There does not appear to be any signs of infection. In fact I think he may be healed except for the fact that unfortunately on the  previously healed location he saw a piece of skin  sticking up he thought it was some of the dressing he pulled on this and unfortunately pulled off some of the skin in the area where this had previously healed. Nonetheless I think that we can definitely use collagen here that is the only one remaining and that is actually new 1 that occurred as a result of the skin tearing. Electronic Signature(s) Signed: 05/29/2020 11:31:38 AM By: Worthy Keeler PA-C Entered By: Worthy Keeler on 05/29/2020 11:31:38 Shawn Hayes ([id number]) -------------------------------------------------------------------------------- Physical Exam Details Patient Name: Shawn Hayes Date of Service: 05/29/2020 11:00 AM Medical Record Number: [id number] Patient Account Number: [id number] Date of Birth/Sex: 10/01/1955 (65 y.o. M) Treating RN: Carlene Coria Primary Care Provider: Frazier Richards Other Clinician: Jeanine Luz Referring Provider: Frazier Richards Treating Provider/Extender: Skipper Cliche in Treatment: 5 Constitutional Well-nourished and well-hydrated in no acute distress. Respiratory normal breathing without difficulty. Psychiatric this patient is able to make decisions and demonstrates good insight into disease process. Alert and Oriented x 3. pleasant and cooperative. Notes Upon inspection patient's wound bed actually showed signs of good granulation epithelization at this point. There does not appear to be any signs of active infection which is great news and overall very pleased with where things stand today. Electronic Signature(s) Signed: 05/29/2020 11:31:51 AM By: Worthy Keeler PA-C Entered By: Worthy Keeler on 05/29/2020 11:31:51 Shawn Hayes ([id number]) -------------------------------------------------------------------------------- Physician Orders Details Patient Name: Shawn Hayes Date of Service: 05/29/2020 11:00 AM Medical Record Number: [id number] Patient Account Number: [id number] Date of Birth/Sex:  22-Aug-1955 (65 y.o. M) Treating RN: Carlene Coria Primary Care Provider: Frazier Richards Other Clinician: Jeanine Luz Referring Provider: Frazier Richards Treating Provider/Extender: Skipper Cliche in Treatment: 5 Verbal / Phone Orders: No Diagnosis Coding ICD-10 Coding Code Description E11.621 Type 2 diabetes mellitus with foot ulcer Z89.421 Acquired absence of other right toe(s) L97.522 Non-pressure chronic ulcer of other part of left foot with fat layer exposed Z89.511 Acquired absence of right leg below knee L97.812 Non-pressure chronic ulcer of other part of right lower leg with fat layer exposed I10 Essential (primary) hypertension I50.42 Chronic combined systolic (congestive) and diastolic (congestive) heart failure J44.9 Chronic obstructive pulmonary disease, unspecified Follow-up Appointments o Return Appointment in 1 week. Bathing/ Shower/ Hygiene o Clean wound with Normal Saline or wound cleanser. Wound Treatment Wound #4 - Amputation Site - Transmetatarsal Wound Laterality: Left, Lateral Cleanser: Normal Saline (Generic) 3 x Per Week/30 Days Discharge Instructions: Wash your hands with soap and water. Remove old dressing, discard into plastic bag and place into trash. Cleanse the wound with Normal Saline prior to applying a clean dressing using gauze sponges, not tissues or cotton balls. Do not scrub or use excessive force. Pat dry using gauze sponges, not tissue or cotton balls. Primary Dressing: Prisma 4.34 (in) (Generic) 3 x Per Week/30 Days Discharge Instructions: Do not moisten, apply directly to wound bed Secondary Dressing: Mepilex Border Flex, 4x4 (in/in) (Generic) 3 x Per Week/30 Days Discharge Instructions: Apply to wound as directed. Do not cut. Electronic Signature(s) Signed: 05/29/2020 4:34:06 PM By: Worthy Keeler PA-C Signed: 06/05/2020 8:11:40 AM By: Carlene Coria RN Entered By: Carlene Coria on 05/29/2020 11:13:58 Shawn Hayes  ([id number]) -------------------------------------------------------------------------------- Problem List Details Patient Name: Shawn Hayes, Shawn Hayes. Date of Service: 05/29/2020 11:00 AM Medical Record Number: [id number] Patient Account Number: [id number] Date of Birth/Sex: 1955-05-25 (65 y.o. M) Treating RN: Carlene Coria Primary Care Provider: Frazier Richards  Other Clinician: Jeanine Luz Referring Provider: Frazier Richards Treating Provider/Extender: Skipper Cliche in Treatment: 5 Active Problems ICD-10 Encounter Code Description Active Date MDM Diagnosis E11.621 Type 2 diabetes mellitus with foot ulcer 04/24/2020 No Yes Z89.421 Acquired absence of other right toe(s) 04/24/2020 No Yes L97.522 Non-pressure chronic ulcer of other part of left foot with fat layer 04/24/2020 No Yes exposed Z89.511 Acquired absence of right leg below knee 04/24/2020 No Yes L97.812 Non-pressure chronic ulcer of other part of right lower leg with fat layer 04/24/2020 No Yes exposed I10 Essential (primary) hypertension 04/24/2020 No Yes I50.42 Chronic combined systolic (congestive) and diastolic (congestive) heart 04/24/2020 No Yes failure J44.9 Chronic obstructive pulmonary disease, unspecified 04/24/2020 No Yes Inactive Problems Resolved Problems Electronic Signature(s) Signed: 05/29/2020 10:53:04 AM By: Worthy Keeler PA-C Entered By: Worthy Keeler on 05/29/2020 10:53:03 Shawn Hayes ([id number]) -------------------------------------------------------------------------------- Progress Note Details Patient Name: Shawn Hayes. Date of Service: 05/29/2020 11:00 AM Medical Record Number: [id number] Patient Account Number: [id number] Date of Birth/Sex: 09-06-1955 (65 y.o. M) Treating RN: Carlene Coria Primary Care Provider: Frazier Richards Other Clinician: Jeanine Luz Referring Provider: Frazier Richards Treating Provider/Extender: Skipper Cliche in Treatment: 5 Subjective Chief  Complaint Information obtained from Patient Right BKA and left foot ulcers History of Present Illness (HPI) 04/24/2020 upon evaluation today patient presents for initial evaluation here in our clinic concerning issues that he has been having with wound dehiscence in regard to his below-knee amputation on the right as well as transmetatarsal amputation on the left. Fortunately this does not appear to be doing too badly which is good news. There is no signs of active infection at this time. I do think he is going require little bit of sharp debridement here but fortunately overall I think he is actually managing quite nicely in a bad situation. The initial below-knee amputation was in January on the right. His transmetatarsal amputation was in November 2021. The patient states that obviously his lifestyle has changed dramatically over the past several months. He seems to be in fairly good spirits all things considered. He does have a history of diabetes mellitus type 2. 05/01/2020 upon evaluation today patient appears to be doing excellent in regard to his wounds. There again will require some sharp debridement to clear away some of the slough today but overall I feel like he is making good progress. Fortunately there does not appear to be any evidence of active infection which is great news. No fevers, chills, nausea, vomiting, or diarrhea. 05/07/2020 upon evaluation today patient appears to be doing well with regard to his wounds in general. All are going require little bit of debridement on the left the right side not really as much although there was a suture pushing out along one of the incision lines. Nonetheless I think this is going to come right out. Fortunately there is no evidence of infection at any site which is great news. 05/14/2020 on evaluation today patient actually appears to be making great progress. In fact his wound on the right stump is actually completely healed. The left foot  amputation site has 2 small areas still open there is a lot of drainage that is going to have to be removed but seems to be doing better. 05/22/2020 upon evaluation today patient appears to be doing excellent in regard to his wounds. He does have a lot of dressing buildup on the surface of the wound 7 have to remove this in order to see where things stand.  Patient will there is no evidence of active infection. 05/29/2020 upon evaluation today patient actually appears to be doing extremely well currently with regard to his wound. Is been tolerating the dressing changes without complication which is great news. There does not appear to be any signs of infection. In fact I think he may be healed except for the fact that unfortunately on the previously healed location he saw a piece of skin sticking up he thought it was some of the dressing he pulled on this and unfortunately pulled off some of the skin in the area where this had previously healed. Nonetheless I think that we can definitely use collagen here that is the only one remaining and that is actually new 1 that occurred as a result of the skin tearing. Objective Constitutional Well-nourished and well-hydrated in no acute distress. Vitals Time Taken: 10:58 AM, Temperature: 98.0 F, Pulse: 67 bpm, Respiratory Rate: 18 breaths/min, Blood Pressure: 168/88 mmHg. Respiratory normal breathing without difficulty. Psychiatric this patient is able to make decisions and demonstrates good insight into disease process. Alert and Oriented x 3. pleasant and cooperative. General Notes: Upon inspection patient's wound bed actually showed signs of good granulation epithelization at this point. There does not appear to be any signs of active infection which is great news and overall very pleased with where things stand today. Integumentary (Hair, Skin) Wound #2 status is Healed - Epithelialized. Original cause of wound was Surgical Injury. The date acquired was:  01/28/2020. The wound has been in treatment 5 weeks. The wound is located on the Left,Medial Amputation Site - Transmetatarsal. The wound measures 0cm length x 0cm Brinkmeier, Lord L. ([id number]) width x 0cm depth; 0cm^2 area and 0cm^3 volume. There is no tunneling or undermining noted. There is a none present amount of drainage noted. There is no granulation within the wound bed. There is no necrotic tissue within the wound bed. Wound #4 status is Open. Original cause of wound was Skin Tear/Laceration. The date acquired was: 05/27/2020. The wound is located on the Left,Lateral Amputation Site - Transmetatarsal. The wound measures 0.9cm length x 1.4cm width x 0.1cm depth; 0.99cm^2 area and 0.099cm^3 volume. There is Fat Layer (Subcutaneous Tissue) exposed. There is no tunneling or undermining noted. There is a medium amount of serous drainage noted. There is large (67-100%) red granulation within the wound bed. There is no necrotic tissue within the wound bed. 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 Bathing/ Shower/ Hygiene: Clean wound with Normal Saline or wound cleanser. WOUND #4: - Amputation Site - Transmetatarsal Wound Laterality: Left, Lateral Cleanser: Normal Saline (Generic) 3 x Per Week/30 Days Discharge Instructions: Wash your hands with soap and water. Remove old dressing, discard into plastic bag and place into trash. Cleanse the wound with Normal Saline prior to applying a clean dressing  using gauze sponges, not tissues or cotton balls. Do not scrub or use excessive force. Pat dry using gauze sponges, not tissue or cotton balls. Primary Dressing: Prisma 4.34 (in) (Generic) 3 x Per Week/30 Days Discharge Instructions: Do not moisten, apply directly to wound bed Secondary  Dressing: Mepilex Border Flex, 4x4 (in/in) (Generic) 3 x Per Week/30 Days Discharge Instructions: Apply to wound as directed. Do not cut. 1. Would recommend currently that we going to continue with wound care measures as before and the patient is in agreement with the plan that includes the use of silver collagen to the new wound. Again this is a skin tear and fairly superficial I think this will heal quite nicely. 2. I am also can recommend patient continue to monitor for any signs of infection right now I see no evidence of anything infected hopefully that continues to do well. We will see patient back for reevaluation in 1 week here in the clinic. If anything worsens or changes patient will contact our office for additional recommendations. Electronic Signature(s) Signed: 05/29/2020 11:32:16 AM By: Worthy Keeler PA-C Entered By: Worthy Keeler on 05/29/2020 11:32:16 Shawn Hayes ([id number]) -------------------------------------------------------------------------------- SuperBill Details Patient Name: Shawn Hayes Date of Service: 05/29/2020 Medical Record Number: [id number] Patient Account Number: [id number] Date of Birth/Sex: 11-19-1955 (65 y.o. M) Treating RN: Carlene Coria Primary Care Provider: Frazier Richards Other Clinician: Jeanine Luz Referring Provider: Frazier Richards Treating Provider/Extender: Skipper Cliche in Treatment: 5 Diagnosis Coding ICD-10 Codes Code Description E11.621 Type 2 diabetes mellitus with foot ulcer Z89.421 Acquired absence of other right toe(s) L97.522 Non-pressure chronic ulcer of other part of left foot with fat layer exposed Z89.511 Acquired  absence of right leg below knee L97.812 Non-pressure chronic ulcer of other part of right lower leg with fat layer exposed I10 Essential (primary) hypertension I50.42 Chronic combined systolic (congestive) and diastolic (congestive) heart failure J44.9 Chronic obstructive pulmonary disease, unspecified Facility Procedures CPT4 Code: [id number] Description: 99213 - WOUND CARE VISIT-LEV 3 EST PT Modifier: Quantity: 1 Physician Procedures CPT4 Code: [id number] Description: 99213 - WC PHYS LEVEL 3 - EST PT Modifier: Quantity: 1 CPT4 Code: Description: ICD-10 Diagnosis Description E11.621 Type 2 diabetes mellitus with foot ulcer Z89.421 Acquired absence of other right toe(s) L97.522 Non-pressure chronic ulcer of other part of left foot with fat layer ex Z89.511 Acquired absence of right  leg below knee Modifier: posed Quantity: Electronic Signature(s) Signed: 05/29/2020 11:32:35 AM By: Worthy Keeler PA-C Entered By: Worthy Keeler on 05/29/2020 11:32:34

## 2020-06-01 NOTE — Progress Notes (Addendum)
Shawn Hayes, Shawn Hayes ([id number]) Visit Report for 05/29/2020 Arrival Information Details Patient Name: Shawn Hayes, Shawn Hayes. Date of Service: 05/29/2020 11:00 AM Medical Record Number: [id number] Patient Account Number: [id number] Date of Birth/Sex: 07-24-1955 (65 y.o. M) Treating RN: Dolan Amen Primary Care Laekyn Rayos: Frazier Richards Other Clinician: Jeanine Luz Referring Amantha Sklar: Frazier Richards Treating Joselyne Spake/Extender: Skipper Cliche in Treatment: 5 Visit Information History Since Last Visit Added or deleted any medications: No Patient Arrived: Wheel Chair Had a fall or experienced change in No Arrival Time: 10:57 activities of daily living that may affect Accompanied By: self risk of falls: Transfer Assistance: None Signs or symptoms of abuse/neglect since last visito No Patient Identification Verified: Yes Hospitalized since last visit: No Secondary Verification Process Completed: Yes Has Dressing in Place as Prescribed: Yes Patient Requires Transmission-Based Precautions: No Pain Present Now: No Patient Has Alerts: Yes Patient Alerts: Type II Diabetic Electronic Signature(s) Signed: 05/29/2020 3:53:39 PM By: Georges Mouse, Minus Breeding RN Entered By: Georges Mouse, Minus Breeding on 05/29/2020 10:58:20 Shawn Hayes ([id number]) -------------------------------------------------------------------------------- Clinic Level of Care Assessment Details Patient Name: Shawn Hayes. Date of Service: 05/29/2020 11:00 AM Medical Record Number: [id number] Patient Account Number: [id number] Date of Birth/Sex: 04-18-1955 (65 y.o. M) Treating RN: Carlene Coria Primary Care Jamion Carter: Frazier Richards Other Clinician: Jeanine Luz Referring Saryiah Bencosme: Frazier Richards Treating Kristle Wesch/Extender: Skipper Cliche in Treatment: 5 Clinic Level of Care Assessment Items TOOL 4 Quantity Score X - Use when only an EandM is performed on FOLLOW-UP visit 1 0 ASSESSMENTS - Nursing  Assessment / Reassessment X - Reassessment of Co-morbidities (includes updates in patient status) 1 10 X- 1 5 Reassessment of Adherence to Treatment Plan ASSESSMENTS - Wound and Skin Assessment / Reassessment X - Simple Wound Assessment / Reassessment - one wound 1 5 []  - 0 Complex Wound Assessment / Reassessment - multiple wounds []  - 0 Dermatologic / Skin Assessment (not related to wound area) ASSESSMENTS - Focused Assessment []  - Circumferential Edema Measurements - multi extremities 0 []  - 0 Nutritional Assessment / Counseling / Intervention []  - 0 Lower Extremity Assessment (monofilament, tuning fork, pulses) []  - 0 Peripheral Arterial Disease Assessment (using hand held doppler) ASSESSMENTS - Ostomy and/or Continence Assessment and Care []  - Incontinence Assessment and Management 0 []  - 0 Ostomy Care Assessment and Management (repouching, etc.) PROCESS - Coordination of Care X - Simple Patient / Family Education for ongoing care 1 15 []  - 0 Complex (extensive) Patient / Family Education for ongoing care X- 1 10 Staff obtains Programmer, systems, Records, Test Results / Process Orders []  - 0 Staff telephones HHA, Nursing Homes / Clarify orders / etc []  - 0 Routine Transfer to another Facility (non-emergent condition) []  - 0 Routine Hospital Admission (non-emergent condition) []  - 0 New Admissions / Biomedical engineer / Ordering NPWT, Apligraf, etc. []  - 0 Emergency Hospital Admission (emergent condition) X- 1 10 Simple Discharge Coordination []  - 0 Complex (extensive) Discharge Coordination PROCESS - Special Needs []  - Pediatric / Minor Patient Management 0 []  - 0 Isolation Patient Management []  - 0 Hearing / Language / Visual special needs []  - 0 Assessment of Community assistance (transportation, D/C planning, etc.) []  - 0 Additional assistance / Altered mentation []  - 0 Support Surface(s) Assessment (bed, cushion, seat, etc.) INTERVENTIONS - Wound Cleansing /  Measurement Imler, Jamair L. ([id number]) X- 1 5 Simple Wound Cleansing - one wound []  - 0 Complex Wound Cleansing - multiple wounds []  - 0 Wound Imaging (photographs - any number of wounds) []  -  0 Wound Tracing (instead of photographs) $RemoveBeforeD'[]'ifySNforfJhhoF$  - 0 Simple Wound Measurement - one wound $RemoveB'[]'CCGxHjju$  - 0 Complex Wound Measurement - multiple wounds INTERVENTIONS - Wound Dressings X - Small Wound Dressing one or multiple wounds 1 10 $Re'[]'IUB$  - 0 Medium Wound Dressing one or multiple wounds $RemoveBeforeD'[]'mOempAOoiTnljL$  - 0 Large Wound Dressing one or multiple wounds X- 1 5 Application of Medications - topical $RemoveB'[]'JgYdeCCc$  - 0 Application of Medications - injection INTERVENTIONS - Miscellaneous $RemoveBeforeD'[]'kmAKKJWNeVGRJa$  - External ear exam 0 $Remo'[]'Yuhzs$  - 0 Specimen Collection (cultures, biopsies, blood, body fluids, etc.) $RemoveBefor'[]'kdUecYsUEZbd$  - 0 Specimen(s) / Culture(s) sent or taken to Lab for analysis $RemoveBefo'[]'TbpcyVJXGfm$  - 0 Patient Transfer (multiple staff / Civil Service fast streamer / Similar devices) $RemoveBeforeDE'[]'lpxBrIwMpVNFzNS$  - 0 Simple Staple / Suture removal (25 or less) $Remove'[]'SLQyTkV$  - 0 Complex Staple / Suture removal (26 or more) $Remove'[]'dkCwwkY$  - 0 Hypo / Hyperglycemic Management (close monitor of Blood Glucose) $RemoveBefore'[]'aKUaRFFRFrFom$  - 0 Ankle / Brachial Index (ABI) - do not check if billed separately X- 1 5 Vital Signs Has the patient been seen at the hospital within the last three years: Yes Total Score: 80 Level Of Care: New/Established - Level 3 Electronic Signature(s) Signed: 06/05/2020 8:11:40 AM By: Carlene Coria RN Entered By: Carlene Coria on 05/29/2020 11:15:35 Shawn Hayes ([id number]) -------------------------------------------------------------------------------- Encounter Discharge Information Details Patient Name: Shawn Hayes. Date of Service: 05/29/2020 11:00 AM Medical Record Number: [id number] Patient Account Number: [id number] Date of Birth/Sex: December 05, 1955 (65 y.o. M) Treating RN: Dolan Amen Primary Care Gauri Galvao: Frazier Richards Other Clinician: Jeanine Luz Referring Jionni Helming: Frazier Richards Treating  Michaeline Eckersley/Extender: Skipper Cliche in Treatment: 5 Encounter Discharge Information Items Discharge Condition: Stable Ambulatory Status: Wheelchair Discharge Destination: Home Transportation: Private Auto Accompanied By: self Schedule Follow-up Appointment: Yes Clinical Summary of Care: Electronic Signature(s) Signed: 05/29/2020 12:36:38 PM By: Georges Mouse, Minus Breeding RN Entered By: Georges Mouse, Minus Breeding on 05/29/2020 12:36:38 Shawn Hayes ([id number]) -------------------------------------------------------------------------------- Lower Extremity Assessment Details Patient Name: Shawn Hayes, Shawn Hayes. Date of Service: 05/29/2020 11:00 AM Medical Record Number: [id number] Patient Account Number: [id number] Date of Birth/Sex: 1956-01-26 (65 y.o. M) Treating RN: Dolan Amen Primary Care Luverna Degenhart: Frazier Richards Other Clinician: Jeanine Luz Referring Kathelene Rumberger: Frazier Richards Treating Jerritt Cardoza/Extender: Jeri Cos Weeks in Treatment: 5 Edema Assessment Assessed: [Left: Yes] [Right: No] Edema: [Left: N] [Right: o] Vascular Assessment Pulses: Dorsalis Pedis Palpable: [Left:Yes] Electronic Signature(s) Signed: 05/29/2020 3:53:39 PM By: Georges Mouse, Minus Breeding RN Entered By: Georges Mouse, Minus Breeding on 05/29/2020 11:07:11 Shawn Hayes ([id number]) -------------------------------------------------------------------------------- Multi Wound Chart Details Patient Name: Shawn Hayes. Date of Service: 05/29/2020 11:00 AM Medical Record Number: [id number] Patient Account Number: [id number] Date of Birth/Sex: 09/12/1955 (65 y.o. M) Treating RN: Carlene Coria Primary Care Ronold Hardgrove: Frazier Richards Other Clinician: Jeanine Luz Referring Jim Philemon: Frazier Richards Treating Timoty Bourke/Extender: Skipper Cliche in Treatment: 5 Vital Signs Height(in): Pulse(bpm): 64 Weight(lbs): Blood Pressure(mmHg): 168/88 Body Mass Index(BMI): Temperature(F):  98.0 Respiratory Rate(breaths/min): 18 Photos: [N/A:N/A] Wound Location: Left, Medial Amputation Site - Left, Lateral Amputation Site - N/A Transmetatarsal Transmetatarsal Wounding Event: Surgical Injury Skin Tear/Laceration N/A Primary Etiology: Dehisced Wound Skin Tear N/A Comorbid History: Congestive Heart Failure, Type II Congestive Heart Failure, Type II N/A Diabetes Diabetes Date Acquired: 01/28/2020 05/27/2020 N/A Weeks of Treatment: 5 0 N/A Wound Status: Open Open N/A Measurements L x W x D (cm) 0.1x0.1x0.1 0.9x1.4x0.1 N/A Area (cm) : 0.008 0.99 N/A Volume (cm) : 0.001 0.099 N/A % Reduction in Area: 99.20% N/A N/A % Reduction in Volume: 99.80% N/A N/A Classification: Full Thickness  Without Exposed Full Thickness Without Exposed N/A Support Structures Support Structures Exudate Amount: None Present Medium N/A Exudate Type: N/A Serous N/A Exudate Color: N/A amber N/A Granulation Amount: None Present (0%) Large (67-100%) N/A Granulation Quality: N/A Red N/A Necrotic Amount: None Present (0%) None Present (0%) N/A Exposed Structures: Fascia: No Fat Layer (Subcutaneous Tissue): N/A Fat Layer (Subcutaneous Tissue): Yes No Fascia: No Tendon: No Tendon: No Muscle: No Muscle: No Joint: No Joint: No Bone: No Bone: No Epithelialization: Large (67-100%) None N/A Treatment Notes Electronic Signature(s) Signed: 06/05/2020 8:11:40 AM By: Carlene Coria RN Entered By: Carlene Coria on 05/29/2020 11:11:57 Shawn Hayes ([id number]) -------------------------------------------------------------------------------- [place] Details Patient Name: Shawn Hayes. Date of Service: 05/29/2020 11:00 AM Medical Record Number: [id number] Patient Account Number: [id number] Date of Birth/Sex: 1955-03-02 (65 y.o. M) Treating RN: Carlene Coria Primary Care Keigan Tafoya: Frazier Richards Other Clinician: Jeanine Luz Referring Nickola Lenig: Frazier Richards Treating  Hideo Googe/Extender: Skipper Cliche in Treatment: 5 Active Inactive Wound/Skin Impairment Nursing Diagnoses: Impaired tissue integrity Goals: Patient/caregiver will verbalize understanding of skin care regimen Date Initiated: 04/24/2020 Date Inactivated: 05/07/2020 Target Resolution Date: 05/22/2020 Goal Status: Met Ulcer/skin breakdown will have a volume reduction of 30% by week 4 Date Initiated: 04/24/2020 Date Inactivated: 05/22/2020 Target Resolution Date: 05/22/2020 Goal Status: Met Ulcer/skin breakdown will have a volume reduction of 50% by week 8 Date Initiated: 04/24/2020 Target Resolution Date: 06/22/2020 Goal Status: Active Ulcer/skin breakdown will have a volume reduction of 80% by week 12 Date Initiated: 04/24/2020 Target Resolution Date: 07/22/2020 Goal Status: Active Interventions: Assess patient/caregiver ability to obtain necessary supplies Assess patient/caregiver ability to perform ulcer/skin care regimen upon admission and as needed Assess ulceration(s) every visit Provide education on ulcer and skin care Treatment Activities: Skin care regimen initiated : 04/24/2020 Notes: Electronic Signature(s) Signed: 06/05/2020 8:11:40 AM By: Carlene Coria RN Entered By: Carlene Coria on 05/29/2020 11:11:48 Shawn Hayes ([id number]) -------------------------------------------------------------------------------- Pain Assessment Details Patient Name: Shawn Hayes. Date of Service: 05/29/2020 11:00 AM Medical Record Number: [id number] Patient Account Number: [id number] Date of Birth/Sex: 01-12-1956 (65 y.o. M) Treating RN: Dolan Amen Primary Care Suhas Estis: Frazier Richards Other Clinician: Jeanine Luz Referring Bertha Lokken: Frazier Richards Treating Roshanda Balazs/Extender: Skipper Cliche in Treatment: 5 Active Problems Location of Pain Severity and Description of Pain Patient Has Paino No Site Locations Rate the pain. Current Pain Level: 0 Pain Management  and Medication Current Pain Management: Electronic Signature(s) Signed: 05/29/2020 3:53:39 PM By: Georges Mouse, Minus Breeding RN Entered By: Georges Mouse, Kenia on 05/29/2020 10:59:27 Shawn Hayes ([id number]) -------------------------------------------------------------------------------- Patient/Caregiver Education Details Patient Name: Shawn Hayes Date of Service: 05/29/2020 11:00 AM Medical Record Number: [id number] Patient Account Number: [id number] Date of Birth/Gender: [birth year] (65 y.o. M) Treating RN: Carlene Coria Primary Care Physician: Frazier Richards Other Clinician: Jeanine Luz Referring Physician: Frazier Richards Treating Physician/Extender: Skipper Cliche in Treatment: 5 Education Assessment Education Provided To: Patient Education Topics Provided Wound/Skin Impairment: Methods: Explain/Verbal Responses: State content correctly Electronic Signature(s) Signed: 06/05/2020 8:11:40 AM By: Carlene Coria RN Entered By: Carlene Coria on 05/29/2020 11:15:53 Shawn Hayes ([id number]) -------------------------------------------------------------------------------- Wound Assessment Details Patient Name: Shawn Hayes. Date of Service: 05/29/2020 11:00 AM Medical Record Number: [id number] Patient Account Number: [id number] Date of Birth/Sex: 1956-02-23 (65 y.o. M) Treating RN: Carlene Coria Primary Care Djon Tith: Frazier Richards Other Clinician: Jeanine Luz Referring Quinci Gavidia: Frazier Richards Treating Raisa Ditto/Extender: Jeri Cos Weeks in Treatment: 5 Wound Status Wound Number: 2 Primary Etiology: Dehisced Wound Wound Location: Left, Medial Amputation  Site - Transmetatarsal Wound Status: Healed - Epithelialized Wounding Event: Surgical Injury Comorbid History: Congestive Heart Failure, Type II Diabetes Date Acquired: 01/28/2020 Weeks Of Treatment: 5 Clustered Wound: No Photos Wound Measurements Length: (cm) 0 Width: (cm) 0 Depth:  (cm) 0 Area: (cm) Volume: (cm) % Reduction in Area: 100% % Reduction in Volume: 100% Epithelialization: Large (67-100%) 0 Tunneling: No 0 Undermining: No Wound Description Classification: Full Thickness Without Exposed Support Structures Exudate Amount: None Present Foul Odor After Cleansing: No Slough/Fibrino No Wound Bed Granulation Amount: None Present (0%) Exposed Structure Necrotic Amount: None Present (0%) Fascia Exposed: No Fat Layer (Subcutaneous Tissue) Exposed: No Tendon Exposed: No Muscle Exposed: No Joint Exposed: No Bone Exposed: No Treatment Notes Wound #2 (Amputation Site - Transmetatarsal) Wound Laterality: Left, Medial Cleanser Peri-Wound Care Topical Primary Dressing Shawn Hayes, Shawn Hayes ([id number]) Secondary Dressing Secured With Compression Wrap Compression Stockings Add-Ons Electronic Signature(s) Signed: 06/05/2020 8:11:40 AM By: Carlene Coria RN Entered By: Carlene Coria on 05/29/2020 11:15:04 Shawn Hayes ([id number]) -------------------------------------------------------------------------------- Wound Assessment Details Patient Name: Shawn Hayes. Date of Service: 05/29/2020 11:00 AM Medical Record Number: [id number] Patient Account Number: [id number] Date of Birth/Sex: January 29, 1956 (65 y.o. M) Treating RN: Dolan Amen Primary Care Morissa Obeirne: Frazier Richards Other Clinician: Jeanine Luz Referring Elysha Daw: Frazier Richards Treating Tremell Reimers/Extender: Jeri Cos Weeks in Treatment: 5 Wound Status Wound Number: 4 Primary Etiology: Skin Tear Wound Location: Left, Lateral Amputation Site - Transmetatarsal Wound Status: Open Wounding Event: Skin Tear/Laceration Comorbid History: Congestive Heart Failure, Type II Diabetes Date Acquired: 05/27/2020 Weeks Of Treatment: 0 Clustered Wound: No Photos Wound Measurements Length: (cm) 0.9 Width: (cm) 1.4 Depth: (cm) 0.1 Area: (cm) 0.99 Volume: (cm) 0.099 % Reduction in  Area: % Reduction in Volume: Epithelialization: None Tunneling: No Undermining: No Wound Description Classification: Full Thickness Without Exposed Support Structu Exudate Amount: Medium Exudate Type: Serous Exudate Color: amber res Wound Bed Granulation Amount: Large (67-100%) Exposed Structure Granulation Quality: Red Fascia Exposed: No Necrotic Amount: None Present (0%) Fat Layer (Subcutaneous Tissue) Exposed: Yes Tendon Exposed: No Muscle Exposed: No Joint Exposed: No Bone Exposed: No Treatment Notes Wound #4 (Amputation Site - Transmetatarsal) Wound Laterality: Left, Lateral Cleanser Normal Saline Discharge Instruction: Wash your hands with soap and water. Remove old dressing, discard into plastic bag and place into trash. Cleanse the wound with Normal Saline prior to applying a clean dressing using gauze sponges, not tissues or cotton balls. Do not scrub or use excessive force. Pat dry using gauze sponges, not tissue or cotton balls. Shawn Hayes, Shawn Hayes ([id number]) Peri-Wound Care Topical Primary Dressing Prisma 4.34 (in) Discharge Instruction: Do not moisten, apply directly to wound bed Secondary Dressing Mepilex Border Flex, 4x4 (in/in) Discharge Instruction: Apply to wound as directed. Do not cut. Secured With Compression Wrap Compression Stockings Add-Ons Electronic Signature(s) Signed: 05/29/2020 3:53:39 PM By: Georges Mouse, Minus Breeding RN Entered By: Georges Mouse, Minus Breeding on 05/29/2020 11:05:42 Shawn Hayes ([id number]) -------------------------------------------------------------------------------- Vitals Details Patient Name: Shawn Hayes Date of Service: 05/29/2020 11:00 AM Medical Record Number: [id number] Patient Account Number: [id number] Date of Birth/Sex: [birth year] (65 y.o. M) Treating RN: Dolan Amen Primary Care Brad Lieurance: Frazier Richards Other Clinician: Jeanine Luz Referring Sidhant Helderman: Frazier Richards Treating  Penelopi Mikrut/Extender: Skipper Cliche in Treatment: 5 Vital Signs Time Taken: 10:58 Temperature (F): 98.0 Pulse (bpm): 67 Respiratory Rate (breaths/min): 18 Blood Pressure (mmHg): 168/88 Reference Range: 80 - 120 mg / dl Electronic Signature(s) Signed: 05/29/2020 3:53:39 PM By: Georges Mouse, Minus Breeding RN Entered By: Georges Mouse, Minus Breeding on 05/29/2020  10:59:11 

## 2020-06-05 ENCOUNTER — Encounter: Payer: Medicare HMO | Admitting: Physician Assistant

## 2020-06-05 ENCOUNTER — Other Ambulatory Visit: Payer: Self-pay

## 2020-06-05 DIAGNOSIS — S91312A Laceration without foreign body, left foot, initial encounter: Secondary | ICD-10-CM | POA: Diagnosis not present

## 2020-06-05 DIAGNOSIS — E1151 Type 2 diabetes mellitus with diabetic peripheral angiopathy without gangrene: Secondary | ICD-10-CM | POA: Diagnosis not present

## 2020-06-05 DIAGNOSIS — I110 Hypertensive heart disease with heart failure: Secondary | ICD-10-CM | POA: Diagnosis not present

## 2020-06-05 DIAGNOSIS — L97522 Non-pressure chronic ulcer of other part of left foot with fat layer exposed: Secondary | ICD-10-CM | POA: Diagnosis not present

## 2020-06-05 DIAGNOSIS — L97812 Non-pressure chronic ulcer of other part of right lower leg with fat layer exposed: Secondary | ICD-10-CM | POA: Diagnosis not present

## 2020-06-05 DIAGNOSIS — Z89511 Acquired absence of right leg below knee: Secondary | ICD-10-CM | POA: Diagnosis not present

## 2020-06-05 DIAGNOSIS — I5042 Chronic combined systolic (congestive) and diastolic (congestive) heart failure: Secondary | ICD-10-CM | POA: Diagnosis not present

## 2020-06-05 DIAGNOSIS — E11621 Type 2 diabetes mellitus with foot ulcer: Secondary | ICD-10-CM | POA: Diagnosis not present

## 2020-06-05 NOTE — Progress Notes (Addendum)
Shawn Hayes ([id number]) Visit Report for 06/05/2020 Chief Complaint Document Details Patient Name: Shawn Hayes. Date of Service: 06/05/2020 11:15 AM Medical Record Number: [id number] Patient Account Number: [id number] Date of Birth/Sex: 21-Jun-1955 (65 y.o. M) Treating RN: Carlene Coria Primary Care Provider: Frazier Richards Other Clinician: Jeanine Luz Referring Provider: Frazier Richards Treating Provider/Extender: Skipper Cliche in Treatment: 6 Information Obtained from: Patient Chief Complaint Right BKA and left foot ulcers Electronic Signature(s) Signed: 06/05/2020 11:16:59 AM By: Worthy Keeler PA-C Entered By: Worthy Keeler on 06/05/2020 11:16:59 Shawn Hayes ([id number]) -------------------------------------------------------------------------------- HPI Details Patient Name: Shawn Hayes Date of Service: 06/05/2020 11:15 AM Medical Record Number: [id number] Patient Account Number: [id number] Date of Birth/Sex: 1955-07-09 (65 y.o. M) Treating RN: Carlene Coria Primary Care Provider: Frazier Richards Other Clinician: Jeanine Luz Referring Provider: Frazier Richards Treating Provider/Extender: Skipper Cliche in Treatment: 6 History of Present Illness HPI Description: 04/24/2020 upon evaluation today patient presents for initial evaluation here in our clinic concerning issues that he has been having with wound dehiscence in regard to his below-knee amputation on the right as well as transmetatarsal amputation on the left. Fortunately this does not appear to be doing too badly which is good news. There is no signs of active infection at this time. I do think he is going require little bit of sharp debridement here but fortunately overall I think he is actually managing quite nicely in a bad situation. The initial below-knee amputation was in January on the right. His transmetatarsal amputation was in November 2021. The patient states that  obviously his lifestyle has changed dramatically over the past several months. He seems to be in fairly good spirits all things considered. He does have a history of diabetes mellitus type 2. 05/01/2020 upon evaluation today patient appears to be doing excellent in regard to his wounds. There again will require some sharp debridement to clear away some of the slough today but overall I feel like he is making good progress. Fortunately there does not appear to be any evidence of active infection which is great news. No fevers, chills, nausea, vomiting, or diarrhea. 05/07/2020 upon evaluation today patient appears to be doing well with regard to his wounds in general. All are going require little bit of debridement on the left the right side not really as much although there was a suture pushing out along one of the incision lines. Nonetheless I think this is going to come right out. Fortunately there is no evidence of infection at any site which is great news. 05/14/2020 on evaluation today patient actually appears to be making great progress. In fact his wound on the right stump is actually completely healed. The left foot amputation site has 2 small areas still open there is a lot of drainage that is going to have to be removed but seems to be doing better. 05/22/2020 upon evaluation today patient appears to be doing excellent in regard to his wounds. He does have a lot of dressing buildup on the surface of the wound 7 have to remove this in order to see where things stand. Patient will there is no evidence of active infection. 05/29/2020 upon evaluation today patient actually appears to be doing extremely well currently with regard to his wound. Is been tolerating the dressing changes without complication which is great news. There does not appear to be any signs of infection. In fact I think he may be healed except for the fact that unfortunately on the  previously healed location he saw a piece of skin  sticking up he thought it was some of the dressing he pulled on this and unfortunately pulled off some of the skin in the area where this had previously healed. Nonetheless I think that we can definitely use collagen here that is the only one remaining and that is actually new 1 that occurred as a result of the skin tearing. 06/05/2020 on evaluation today patient appears to be doing well with regard to his wound. He has been tolerating the dressing changes without complication. Fortunately there is no signs of active infection at this time. No fever chills noted. I think the wound is very close to complete resolution likely this will be healed come next week. Electronic Signature(s) Signed: 06/05/2020 11:35:56 AM By: Worthy Keeler PA-C Entered By: Worthy Keeler on 06/05/2020 11:35:56 Shawn Hayes ([id number]) -------------------------------------------------------------------------------- Physical Exam Details Patient Name: Shawn Hayes Date of Service: 06/05/2020 11:15 AM Medical Record Number: [id number] Patient Account Number: [id number] Date of Birth/Sex: 12-21-1955 (65 y.o. M) Treating RN: Carlene Coria Primary Care Provider: Frazier Richards Other Clinician: Jeanine Luz Referring Provider: Frazier Richards Treating Provider/Extender: Skipper Cliche in Treatment: 6 Constitutional Well-nourished and well-hydrated in no acute distress. Respiratory normal breathing without difficulty. Psychiatric this patient is able to make decisions and demonstrates good insight into disease process. Alert and Oriented x 3. pleasant and cooperative. Notes Patient's wound bed did not require any sharp debridement at this point. There does not appear to be any signs of active infection which is great news and overall very pleased with where things stand today. No fevers, chills, nausea, vomiting, or diarrhea. Electronic Signature(s) Signed: 06/05/2020 11:36:13 AM By: Worthy Keeler  PA-C Entered By: Worthy Keeler on 06/05/2020 11:36:13 Shawn Hayes ([id number]) -------------------------------------------------------------------------------- Physician Orders Details Patient Name: Shawn Hayes Date of Service: 06/05/2020 11:15 AM Medical Record Number: [id number] Patient Account Number: [id number] Date of Birth/Sex: 1955-04-16 (65 y.o. M) Treating RN: Cornell Barman Primary Care Provider: Frazier Richards Other Clinician: Jeanine Luz Referring Provider: Frazier Richards Treating Provider/Extender: Skipper Cliche in Treatment: 6 Verbal / Phone Orders: No Diagnosis Coding ICD-10 Coding Code Description E11.621 Type 2 diabetes mellitus with foot ulcer Z89.421 Acquired absence of other right toe(s) L97.522 Non-pressure chronic ulcer of other part of left foot with fat layer exposed Z89.511 Acquired absence of right leg below knee L97.812 Non-pressure chronic ulcer of other part of right lower leg with fat layer exposed I10 Essential (primary) hypertension I50.42 Chronic combined systolic (congestive) and diastolic (congestive) heart failure J44.9 Chronic obstructive pulmonary disease, unspecified Follow-up Appointments o Return Appointment in 1 week. Bathing/ Shower/ Hygiene o Clean wound with Normal Saline or wound cleanser. Wound Treatment Wound #4 - Amputation Site - Transmetatarsal Wound Laterality: Left, Lateral Cleanser: Normal Saline (Generic) 3 x Per Week/30 Days Discharge Instructions: Wash your hands with soap and water. Remove old dressing, discard into plastic bag and place into trash. Cleanse the wound with Normal Saline prior to applying a clean dressing using gauze sponges, not tissues or cotton balls. Do not scrub or use excessive force. Pat dry using gauze sponges, not tissue or cotton balls. Peri-Wound Care: Moisturizing Lotion 3 x Per Week/30 Days Discharge Instructions: Suggestions: Theraderm, Eucerin, Cetaphil, or patient  preference. Primary Dressing: Prisma 4.34 (in) (Generic) 3 x Per Week/30 Days Discharge Instructions: Do not moisten, apply directly to wound bed Secondary Dressing: Mepilex Border Flex, 4x4 (in/in) (Generic) 3 x Per Week/30 Days  Discharge Instructions: Apply to wound as directed. Do not cut. Electronic Signature(s) Signed: 06/05/2020 5:04:35 PM By: Gretta Cool, BSN, RN, CWS, Kim RN, BSN Signed: 06/05/2020 5:24:00 PM By: Worthy Keeler PA-C Entered By: Gretta Cool, BSN, RN, CWS, Kim on 06/05/2020 11:34:34 Shawn Hayes ([id number]) -------------------------------------------------------------------------------- Problem List Details Patient Name: DELANCE, VANDERZWAAG. Date of Service: 06/05/2020 11:15 AM Medical Record Number: [id number] Patient Account Number: [id number] Date of Birth/Sex: [birth year] (65 y.o. M) Treating RN: Carlene Coria Primary Care Provider: Frazier Richards Other Clinician: Jeanine Luz Referring Provider: Frazier Richards Treating Provider/Extender: Skipper Cliche in Treatment: 6 Active Problems ICD-10 Encounter Code Description Active Date MDM Diagnosis E11.621 Type 2 diabetes mellitus with foot ulcer 04/24/2020 No Yes Z89.421 Acquired absence of other right toe(s) 04/24/2020 No Yes L97.522 Non-pressure chronic ulcer of other part of left foot with fat layer 04/24/2020 No Yes exposed Z89.511 Acquired absence of right leg below knee 04/24/2020 No Yes L97.812 Non-pressure chronic ulcer of other part of right lower leg with fat layer 04/24/2020 No Yes exposed [place] (primary) hypertension 04/24/2020 No Yes I50.42 Chronic combined systolic (congestive) and diastolic (congestive) heart 04/24/2020 No Yes failure J44.9 Chronic obstructive pulmonary disease, unspecified 04/24/2020 No Yes Inactive Problems Resolved Problems Electronic Signature(s) Signed: 06/05/2020 11:16:53 AM By: Worthy Keeler PA-C Entered By: Worthy Keeler on 06/05/2020 11:16:53 Shawn Hayes  ([id number]) -------------------------------------------------------------------------------- Progress Note Details Patient Name: Shawn Hayes. Date of Service: 06/05/2020 11:15 AM Medical Record Number: [id number] Patient Account Number: [id number] Date of Birth/Sex: February 05, 1956 (65 y.o. M) Treating RN: Carlene Coria Primary Care Provider: Frazier Richards Other Clinician: Jeanine Luz Referring Provider: Frazier Richards Treating Provider/Extender: Skipper Cliche in Treatment: 6 Subjective Chief Complaint Information obtained from Patient Right BKA and left foot ulcers History of Present Illness (HPI) 04/24/2020 upon evaluation today patient presents for initial evaluation here in our clinic concerning issues that he has been having with wound dehiscence in regard to his below-knee amputation on the right as well as transmetatarsal amputation on the left. Fortunately this does not appear to be doing too badly which is good news. There is no signs of active infection at this time. I do think he is going require little bit of sharp debridement here but fortunately overall I think he is actually managing quite nicely in a bad situation. The initial below-knee amputation was in January on the right. His transmetatarsal amputation was in November 2021. The patient states that obviously his lifestyle has changed dramatically over the past several months. He seems to be in fairly good spirits all things considered. He does have a history of diabetes mellitus type 2. 05/01/2020 upon evaluation today patient appears to be doing excellent in regard to his wounds. There again will require some sharp debridement to clear away some of the slough today but overall I feel like he is making good progress. Fortunately there does not appear to be any evidence of active infection which is great news. No fevers, chills, nausea, vomiting, or diarrhea. 05/07/2020 upon evaluation today patient appears to  be doing well with regard to his wounds in general. All are going require little bit of debridement on the left the right side not really as much although there was a suture pushing out along one of the incision lines. Nonetheless I think this is going to come right out. Fortunately there is no evidence of infection at any site which is great news. 05/14/2020 on evaluation today patient actually appears to be making  great progress. In fact his wound on the right stump is actually completely healed. The left foot amputation site has 2 small areas still open there is a lot of drainage that is going to have to be removed but seems to be doing better. 05/22/2020 upon evaluation today patient appears to be doing excellent in regard to his wounds. He does have a lot of dressing buildup on the surface of the wound 7 have to remove this in order to see where things stand. Patient will there is no evidence of active infection. 05/29/2020 upon evaluation today patient actually appears to be doing extremely well currently with regard to his wound. Is been tolerating the dressing changes without complication which is great news. There does not appear to be any signs of infection. In fact I think he may be healed except for the fact that unfortunately on the previously healed location he saw a piece of skin sticking up he thought it was some of the dressing he pulled on this and unfortunately pulled off some of the skin in the area where this had previously healed. Nonetheless I think that we can definitely use collagen here that is the only one remaining and that is actually new 1 that occurred as a result of the skin tearing. 06/05/2020 on evaluation today patient appears to be doing well with regard to his wound. He has been tolerating the dressing changes without complication. Fortunately there is no signs of active infection at this time. No fever chills noted. I think the wound is very close to complete resolution  likely this will be healed come next week. Objective Constitutional Well-nourished and well-hydrated in no acute distress. Vitals Time Taken: 11:12 AM, Temperature: 97.8 F, Pulse: 64 bpm, Respiratory Rate: 16 breaths/min, Blood Pressure: 158/91 mmHg. Respiratory normal breathing without difficulty. Psychiatric this patient is able to make decisions and demonstrates good insight into disease process. Alert and Oriented x 3. pleasant and cooperative. General Notes: Patient's wound bed did not require any sharp debridement at this point. There does not appear to be any signs of active infection which is great news and overall very pleased with where things stand today. No fevers, chills, nausea, vomiting, or diarrhea. Shawn Hayes, Shawn Hayes ([id number]) Integumentary (Hair, Skin) Wound #4 status is Open. Original cause of wound was Skin Tear/Laceration. The date acquired was: 05/27/2020. The wound has been in treatment 1 weeks. The wound is located on the Left,Lateral Amputation Site - Transmetatarsal. The wound measures 0.4cm length x 0.5cm width x 0.1cm depth; 0.157cm^2 area and 0.016cm^3 volume. There is Fat Layer (Subcutaneous Tissue) exposed. There is no tunneling or undermining noted. There is a small amount of serous drainage noted. There is large (67-100%) red granulation within the wound bed. There is a small (1-33%) amount of necrotic tissue within the wound bed including Adherent Slough. Assessment Active Problems ICD-10 Type 2 diabetes mellitus with foot ulcer Acquired absence of other right toe(s) Non-pressure chronic ulcer of other part of left foot with fat layer exposed Acquired absence of right leg below knee Non-pressure chronic ulcer of other part of right lower leg with fat layer exposed Essential (primary) hypertension Chronic combined systolic (congestive) and diastolic (congestive) heart failure Chronic obstructive pulmonary disease, unspecified Plan Follow-up  Appointments: Return Appointment in 1 week. Bathing/ Shower/ Hygiene: Clean wound with Normal Saline or wound cleanser. WOUND #4: - Amputation Site - Transmetatarsal Wound Laterality: Left, Lateral Cleanser: Normal Saline (Generic) 3 x Per Week/30 Days Discharge Instructions: Wash your  hands with soap and water. Remove old dressing, discard into plastic bag and place into trash. Cleanse the wound with Normal Saline prior to applying a clean dressing using gauze sponges, not tissues or cotton balls. Do not scrub or use excessive force. Pat dry using gauze sponges, not tissue or cotton balls. Peri-Wound Care: Moisturizing Lotion 3 x Per Week/30 Days Discharge Instructions: Suggestions: Theraderm, Eucerin, Cetaphil, or patient preference. Primary Dressing: Prisma 4.34 (in) (Generic) 3 x Per Week/30 Days Discharge Instructions: Do not moisten, apply directly to wound bed Secondary Dressing: Mepilex Border Flex, 4x4 (in/in) (Generic) 3 x Per Week/30 Days Discharge Instructions: Apply to wound as directed. Do not cut. 1. Would recommend that we go ahead and continue with wound care measures as before and the patient is in agreement with the plan that includes the use of silver collagen to the wound bed which I think is doing also. 2. I am also can recommend that we have the patient go ahead and continue to monitor for any signs of worsening if anything changes he will let me know. We will see patient back for reevaluation in 1 week here in the clinic. If anything worsens or changes patient will contact our office for additional recommendations. Electronic Signature(s) Signed: 06/05/2020 11:36:32 AM By: Worthy Keeler PA-C Entered By: Worthy Keeler on 06/05/2020 11:36:32 Shawn Hayes ([id number]) -------------------------------------------------------------------------------- SuperBill Details Patient Name: Shawn Hayes Date of Service: 06/05/2020 Medical Record Number:  [id number] Patient Account Number: [id number] Date of Birth/Sex: 09/02/1955 (65 y.o. M) Treating RN: Carlene Coria Primary Care Provider: Frazier Richards Other Clinician: Jeanine Luz Referring Provider: Frazier Richards Treating Provider/Extender: Skipper Cliche in Treatment: 6 Diagnosis Coding ICD-10 Codes Code Description E11.621 Type 2 diabetes mellitus with foot ulcer Z89.421 Acquired absence of other right toe(s) L97.522 Non-pressure chronic ulcer of other part of left foot with fat layer exposed Z89.511 Acquired absence of right leg below knee L97.812 Non-pressure chronic ulcer of other part of right lower leg with fat layer exposed I10 Essential (primary) hypertension I50.42 Chronic combined systolic (congestive) and diastolic (congestive) heart failure J44.9 Chronic obstructive pulmonary disease, unspecified Facility Procedures CPT4 Code: [id number] Description: 99213 - WOUND CARE VISIT-LEV 3 EST PT Modifier: Quantity: 1 Physician Procedures CPT4 Code: [id number] Description: 99213 - WC PHYS LEVEL 3 - EST PT Modifier: Quantity: 1 CPT4 Code: Description: ICD-10 Diagnosis Description E11.621 Type 2 diabetes mellitus with foot ulcer Z89.421 Acquired absence of other right toe(s) L97.522 Non-pressure chronic ulcer of other part of left foot with fat layer ex Z89.511 Acquired absence of right  leg below knee Modifier: posed Quantity: Electronic Signature(s) Signed: 06/05/2020 11:37:11 AM By: Worthy Keeler PA-C Entered By: Worthy Keeler on 06/05/2020 11:37:11

## 2020-06-09 NOTE — Progress Notes (Signed)
Shawn Hayes, Shawn Hayes ([id number]) Visit Report for 06/05/2020 Arrival Information Details Patient Name: Shawn Hayes, Shawn Hayes. Date of Service: 06/05/2020 11:15 AM Medical Record Number: [id number] Patient Account Number: [id number] Date of Birth/Sex: 1955/06/11 (65 y.o. M) Treating RN: Carlene Coria Primary Care Skip Litke: Frazier Richards Other Clinician: Jeanine Luz Referring Shauntell Iglesia: Frazier Richards Treating Marcquis Ridlon/Extender: Skipper Cliche in Treatment: 6 Visit Information History Since Last Visit Added or deleted any medications: No Patient Arrived: Wheel Chair Had a fall or experienced change in No Arrival Time: 11:12 activities of daily living that may affect Accompanied By: daughter in lobby risk of falls: Transfer Assistance: None Hospitalized since last visit: No Patient Identification Verified: Yes Pain Present Now: No Secondary Verification Process Completed: Yes Patient Requires Transmission-Based Precautions: No Patient Has Alerts: Yes Patient Alerts: Type II Diabetic Electronic Signature(s) Signed: 06/09/2020 8:32:14 AM By: Jeanine Luz Entered By: Jeanine Luz on 06/05/2020 11:12:42 Shawn Hayes ([id number]) -------------------------------------------------------------------------------- Clinic Level of Care Assessment Details Patient Name: Shawn Hayes. Date of Service: 06/05/2020 11:15 AM Medical Record Number: [id number] Patient Account Number: [id number] Date of Birth/Sex: [birth year] (65 y.o. M) Treating RN: Cornell Barman Primary Care Gauri Galvao: Frazier Richards Other Clinician: Jeanine Luz Referring Harlis Champoux: Frazier Richards Treating Keyonni Percival/Extender: Skipper Cliche in Treatment: 6 Clinic Level of Care Assessment Items TOOL 4 Quantity Score []  - Use when only an EandM is performed on FOLLOW-UP visit 0 ASSESSMENTS - Nursing Assessment / Reassessment X - Reassessment of Co-morbidities (includes updates in patient status) 1 10 X-  1 5 Reassessment of Adherence to Treatment Plan ASSESSMENTS - Wound and Skin Assessment / Reassessment X - Simple Wound Assessment / Reassessment - one wound 1 5 []  - 0 Complex Wound Assessment / Reassessment - multiple wounds []  - 0 Dermatologic / Skin Assessment (not related to wound area) ASSESSMENTS - Focused Assessment []  - Circumferential Edema Measurements - multi extremities 0 []  - 0 Nutritional Assessment / Counseling / Intervention []  - 0 Lower Extremity Assessment (monofilament, tuning fork, pulses) []  - 0 Peripheral Arterial Disease Assessment (using hand held doppler) ASSESSMENTS - Ostomy and/or Continence Assessment and Care []  - Incontinence Assessment and Management 0 []  - 0 Ostomy Care Assessment and Management (repouching, etc.) PROCESS - Coordination of Care X - Simple Patient / Family Education for ongoing care 1 15 []  - 0 Complex (extensive) Patient / Family Education for ongoing care X- 1 10 Staff obtains Programmer, systems, Records, Test Results / Process Orders []  - 0 Staff telephones HHA, Nursing Homes / Clarify orders / etc []  - 0 Routine Transfer to another Facility (non-emergent condition) []  - 0 Routine Hospital Admission (non-emergent condition) []  - 0 New Admissions / Biomedical engineer / Ordering NPWT, Apligraf, etc. []  - 0 Emergency Hospital Admission (emergent condition) X- 1 10 Simple Discharge Coordination []  - 0 Complex (extensive) Discharge Coordination PROCESS - Special Needs []  - Pediatric / Minor Patient Management 0 []  - 0 Isolation Patient Management []  - 0 Hearing / Language / Visual special needs []  - 0 Assessment of Community assistance (transportation, D/C planning, etc.) []  - 0 Additional assistance / Altered mentation []  - 0 Support Surface(s) Assessment (bed, cushion, seat, etc.) INTERVENTIONS - Wound Cleansing / Measurement Shawn Hayes, Shawn L. ([id number]) X- 1 5 Simple Wound Cleansing - one wound []  - 0 Complex  Wound Cleansing - multiple wounds X- 1 5 Wound Imaging (photographs - any number of wounds) []  - 0 Wound Tracing (instead of photographs) []  - 0 Simple Wound Measurement - one wound []  -  0 Complex Wound Measurement - multiple wounds INTERVENTIONS - Wound Dressings []  - Small Wound Dressing one or multiple wounds 0 X- 1 15 Medium Wound Dressing one or multiple wounds []  - 0 Large Wound Dressing one or multiple wounds []  - 0 Application of Medications - topical []  - 0 Application of Medications - injection INTERVENTIONS - Miscellaneous []  - External ear exam 0 []  - 0 Specimen Collection (cultures, biopsies, blood, body fluids, etc.) []  - 0 Specimen(s) / Culture(s) sent or taken to Lab for analysis []  - 0 Patient Transfer (multiple staff / Civil Service fast streamer / Similar devices) []  - 0 Simple Staple / Suture removal (25 or less) []  - 0 Complex Staple / Suture removal (26 or more) []  - 0 Hypo / Hyperglycemic Management (close monitor of Blood Glucose) []  - 0 Ankle / Brachial Index (ABI) - do not check if billed separately X- 1 5 Vital Signs Has the patient been seen at the hospital within the last three years: Yes Total Score: 85 Level Of Care: New/Established - Level 3 Electronic Signature(s) Signed: 06/05/2020 5:04:35 PM By: Gretta Cool, BSN, RN, CWS, Kim RN, BSN Entered By: Gretta Cool, BSN, RN, CWS, Kim on 06/05/2020 11:33:22 Shawn Hayes ([id number]) -------------------------------------------------------------------------------- Encounter Discharge Information Details Patient Name: Shawn Hayes. Date of Service: 06/05/2020 11:15 AM Medical Record Number: [id number] Patient Account Number: [id number] Date of Birth/Sex: 1955/05/29 (65 y.o. M) Treating RN: Donnamarie Poag Primary Care Jolayne Branson: Frazier Richards Other Clinician: Jeanine Luz Referring Ciella Obi: Frazier Richards Treating Leeona Mccardle/Extender: Skipper Cliche in Treatment: 6 Encounter Discharge Information  Items Discharge Condition: Stable Ambulatory Status: Wheelchair Discharge Destination: Home Transportation: Other Accompanied By: self Schedule Follow-up Appointment: Yes Clinical Summary of Care: Electronic Signature(s) Signed: 06/05/2020 4:43:09 PM By: Donnamarie Poag Entered By: Donnamarie Poag on 06/05/2020 11:42:07 Shawn Hayes ([id number]) -------------------------------------------------------------------------------- Lower Extremity Assessment Details Patient Name: Shawn Hayes. Date of Service: 06/05/2020 11:15 AM Medical Record Number: [id number] Patient Account Number: [id number] Date of Birth/Sex: 10-23-1955 (65 y.o. M) Treating RN: Carlene Coria Primary Care Dedra Matsuo: Frazier Richards Other Clinician: Jeanine Luz Referring Kaarin Pardy: Frazier Richards Treating Gunda Maqueda/Extender: Jeri Cos Weeks in Treatment: 6 Electronic Signature(s) Signed: 06/08/2020 7:55:29 AM By: Carlene Coria RN Signed: 06/09/2020 8:32:14 AM By: Jeanine Luz Entered By: Jeanine Luz on 06/05/2020 Shawn Hayes, Shawn Hayes ([id number]) -------------------------------------------------------------------------------- Multi Wound Chart Details Patient Name: Shawn Hayes, Shawn Hayes. Date of Service: 06/05/2020 11:15 AM Medical Record Number: [id number] Patient Account Number: [id number] Date of Birth/Sex: May 28, 1955 (65 y.o. M) Treating RN: Cornell Barman Primary Care Santanna Whitford: Frazier Richards Other Clinician: Jeanine Luz Referring Emmajane Altamura: Frazier Richards Treating Canesha Tesfaye/Extender: Skipper Cliche in Treatment: 6 Vital Signs Height(in): Pulse(bpm): 92 Weight(lbs): Blood Pressure(mmHg): 158/91 Body Mass Index(BMI): Temperature(F): 97.8 Respiratory Rate(breaths/min): 16 Photos: [N/A:N/A] Wound Location: Left, Lateral Amputation Site - N/A N/A Transmetatarsal Wounding Event: Skin Tear/Laceration N/A N/A Primary Etiology: Skin Tear N/A N/A Comorbid History: Congestive Heart  Failure, Type II N/A N/A Diabetes Date Acquired: 05/27/2020 N/A N/A Weeks of Treatment: 1 N/A N/A Wound Status: Open N/A N/A Measurements L x W x D (cm) 0.4x0.5x0.1 N/A N/A Area (cm) : 0.157 N/A N/A Volume (cm) : 0.016 N/A N/A % Reduction in Area: 84.10% N/A N/A % Reduction in Volume: 83.80% N/A N/A Classification: Full Thickness Without Exposed N/A N/A Support Structures Exudate Amount: Small N/A N/A Exudate Type: Serous N/A N/A Exudate Color: amber N/A N/A Granulation Amount: Large (67-100%) N/A N/A Granulation Quality: Red N/A N/A Necrotic Amount: Small (1-33%) N/A N/A Exposed Structures: Fat  Layer (Subcutaneous Tissue): N/A N/A Yes Fascia: No Tendon: No Muscle: No Joint: No Bone: No Epithelialization: None N/A N/A Treatment Notes Electronic Signature(s) Signed: 06/05/2020 5:04:35 PM By: Gretta Cool, BSN, RN, CWS, Kim RN, BSN Entered By: Gretta Cool, BSN, RN, CWS, Kim on 06/05/2020 11:32:08 Shawn Hayes ([id number]) -------------------------------------------------------------------------------- Drake Details Patient Name: Shawn Hayes, Shawn Hayes. Date of Service: 06/05/2020 11:15 AM Medical Record Number: [id number] Patient Account Number: [id number] Date of Birth/Sex: 1955/06/07 (65 y.o. M) Treating RN: Cornell Barman Primary Care Tajia Szeliga: Frazier Richards Other Clinician: Jeanine Luz Referring Liliah Dorian: Frazier Richards Treating Vyom Brass/Extender: Skipper Cliche in Treatment: 6 Active Inactive Wound/Skin Impairment Nursing Diagnoses: Impaired tissue integrity Goals: Patient/caregiver will verbalize understanding of skin care regimen Date Initiated: 04/24/2020 Date Inactivated: 05/07/2020 Target Resolution Date: 05/22/2020 Goal Status: Met Ulcer/skin breakdown will have a volume reduction of 30% by week 4 Date Initiated: 04/24/2020 Date Inactivated: 05/22/2020 Target Resolution Date: 05/22/2020 Goal Status: Met Ulcer/skin breakdown will have a  volume reduction of 50% by week 8 Date Initiated: 04/24/2020 Target Resolution Date: 06/22/2020 Goal Status: Active Ulcer/skin breakdown will have a volume reduction of 80% by week 12 Date Initiated: 04/24/2020 Target Resolution Date: 07/22/2020 Goal Status: Active Interventions: Assess patient/caregiver ability to obtain necessary supplies Assess patient/caregiver ability to perform ulcer/skin care regimen upon admission and as needed Assess ulceration(s) every visit Provide education on ulcer and skin care Treatment Activities: Skin care regimen initiated : 04/24/2020 Notes: Electronic Signature(s) Signed: 06/05/2020 5:04:35 PM By: Gretta Cool, BSN, RN, CWS, Kim RN, BSN Entered By: Gretta Cool, BSN, RN, CWS, Kim on 06/05/2020 11:31:40 Shawn Hayes ([id number]) -------------------------------------------------------------------------------- Pain Assessment Details Patient Name: Shawn Hayes. Date of Service: 06/05/2020 11:15 AM Medical Record Number: [id number] Patient Account Number: [id number] Date of Birth/Sex: [birth year] (65 y.o. M) Treating RN: Carlene Coria Primary Care Marua Qin: Frazier Richards Other Clinician: Jeanine Luz Referring Micaela Stith: Frazier Richards Treating Naiya Corral/Extender: Skipper Cliche in Treatment: 6 Active Problems Location of Pain Severity and Description of Pain Patient Has Paino No Site Locations Rate the pain. Current Pain Level: 0 Pain Management and Medication Current Pain Management: Electronic Signature(s) Signed: 06/08/2020 7:55:29 AM By: Carlene Coria RN Signed: 06/09/2020 8:32:14 AM By: Jeanine Luz Entered By: Jeanine Luz on 06/05/2020 11:16:34 Shawn Hayes ([id number]) -------------------------------------------------------------------------------- Patient/Caregiver Education Details Patient Name: Shawn Hayes Date of Service: 06/05/2020 11:15 AM Medical Record Number: [id number] Patient Account Number: [id number] Date  of Birth/Gender: Apr 14, 1955 (65 y.o. M) Treating RN: Cornell Barman Primary Care Physician: Frazier Richards Other Clinician: Jeanine Luz Referring Physician: Frazier Richards Treating Physician/Extender: Skipper Cliche in Treatment: 6 Education Assessment Education Provided To: Patient Education Topics Provided Pressure: Handouts: Pressure Ulcers: Care and Offloading Methods: Demonstration, Explain/Verbal Responses: State content correctly Wound/Skin Impairment: Handouts: Caring for Your Ulcer, Other: continue wound care as prescribed Methods: Demonstration, Explain/Verbal Responses: State content correctly Electronic Signature(s) Signed: 06/05/2020 5:04:35 PM By: Gretta Cool, BSN, RN, CWS, Kim RN, BSN Entered By: Gretta Cool, BSN, RN, CWS, Kim on 06/05/2020 11:35:14 Shawn Hayes ([id number]) -------------------------------------------------------------------------------- Wound Assessment Details Patient Name: Shawn Hayes. Date of Service: 06/05/2020 11:15 AM Medical Record Number: [id number] Patient Account Number: [id number] Date of Birth/Sex: 1955/04/10 (65 y.o. M) Treating RN: Carlene Coria Primary Care Madaleine Simmon: Frazier Richards Other Clinician: Jeanine Luz Referring Saed Hudlow: Frazier Richards Treating Cherryl Babin/Extender: Skipper Cliche in Treatment: 6 Wound Status Wound Number: 4 Primary Etiology: Skin Tear Wound Location: Left, Lateral Amputation Site - Transmetatarsal Wound Status: Open Wounding Event: Skin Tear/Laceration Comorbid History: Congestive Heart Failure,  Type II Diabetes Date Acquired: 05/27/2020 Weeks Of Treatment: 1 Clustered Wound: No Photos Wound Measurements Length: (cm) 0.4 Width: (cm) 0.5 Depth: (cm) 0.1 Area: (cm) 0.157 Volume: (cm) 0.016 % Reduction in Area: 84.1% % Reduction in Volume: 83.8% Epithelialization: None Tunneling: No Undermining: No Wound Description Classification: Full Thickness Without Exposed Support  Structures Exudate Amount: Small Exudate Type: Serous Exudate Color: amber Foul Odor After Cleansing: No Slough/Fibrino Yes Wound Bed Granulation Amount: Large (67-100%) Exposed Structure Granulation Quality: Red Fascia Exposed: No Necrotic Amount: Small (1-33%) Fat Layer (Subcutaneous Tissue) Exposed: Yes Necrotic Quality: Adherent Slough Tendon Exposed: No Muscle Exposed: No Joint Exposed: No Bone Exposed: No Treatment Notes Wound #4 (Amputation Site - Transmetatarsal) Wound Laterality: Left, Lateral Cleanser Normal Saline Discharge Instruction: Wash your hands with soap and water. Remove old dressing, discard into plastic bag and place into trash. Cleanse the wound with Normal Saline prior to applying a clean dressing using gauze sponges, not tissues or cotton balls. Do not scrub or use excessive force. Pat dry using gauze sponges, not tissue or cotton balls. Shawn Hayes, Shawn Hayes ([id number]) [place] Lotion Discharge Instruction: Suggestions: Theraderm, Eucerin, Cetaphil, or patient preference. Topical Primary Dressing Prisma 4.34 (in) Discharge Instruction: Do not moisten, apply directly to wound bed Secondary Dressing Mepilex Border Flex, 4x4 (in/in) Discharge Instruction: Apply to wound as directed. Do not cut. Secured With Compression Wrap Compression Stockings Add-Ons Electronic Signature(s) Signed: 06/08/2020 7:55:29 AM By: Carlene Coria RN Signed: 06/09/2020 8:32:14 AM By: Jeanine Luz Entered By: Jeanine Luz on 06/05/2020 11:22:02 Shawn Hayes ([id number]) -------------------------------------------------------------------------------- Vitals Details Patient Name: Shawn Hayes. Date of Service: 06/05/2020 11:15 AM Medical Record Number: [id number] Patient Account Number: [id number] Date of Birth/Sex: 04-29-1955 (65 y.o. M) Treating RN: Carlene Coria Primary Care Jakerria Kingbird: Frazier Richards Other Clinician: Jeanine Luz Referring Nilaya Bouie: Frazier Richards Treating Lanora Reveron/Extender: Skipper Cliche in Treatment: 6 Vital Signs Time Taken: 11:12 Temperature (F): 97.8 Pulse (bpm): 64 Respiratory Rate (breaths/min): 16 Blood Pressure (mmHg): 158/91 Reference Range: 80 - 120 mg / dl Electronic Signature(s) Signed: 06/09/2020 8:32:14 AM By: Jeanine Luz Entered By: Jeanine Luz on 06/05/2020 11:16:25

## 2020-06-12 ENCOUNTER — Other Ambulatory Visit: Payer: Self-pay

## 2020-06-12 ENCOUNTER — Ambulatory Visit: Payer: Medicare HMO | Admitting: Physician Assistant

## 2020-06-12 ENCOUNTER — Encounter: Payer: Medicare HMO | Admitting: Physician Assistant

## 2020-06-12 DIAGNOSIS — Z89511 Acquired absence of right leg below knee: Secondary | ICD-10-CM | POA: Diagnosis not present

## 2020-06-12 DIAGNOSIS — E1151 Type 2 diabetes mellitus with diabetic peripheral angiopathy without gangrene: Secondary | ICD-10-CM | POA: Diagnosis not present

## 2020-06-12 DIAGNOSIS — I110 Hypertensive heart disease with heart failure: Secondary | ICD-10-CM | POA: Diagnosis not present

## 2020-06-12 DIAGNOSIS — E11621 Type 2 diabetes mellitus with foot ulcer: Secondary | ICD-10-CM | POA: Diagnosis not present

## 2020-06-12 DIAGNOSIS — L97522 Non-pressure chronic ulcer of other part of left foot with fat layer exposed: Secondary | ICD-10-CM | POA: Diagnosis not present

## 2020-06-12 DIAGNOSIS — I5042 Chronic combined systolic (congestive) and diastolic (congestive) heart failure: Secondary | ICD-10-CM | POA: Diagnosis not present

## 2020-06-12 DIAGNOSIS — L97812 Non-pressure chronic ulcer of other part of right lower leg with fat layer exposed: Secondary | ICD-10-CM | POA: Diagnosis not present

## 2020-06-12 NOTE — Progress Notes (Addendum)
Shawn, Hayes ([id number]) Visit Report for 06/12/2020 Arrival Information Details Patient Name: Shawn, Hayes. Date of Service: 06/12/2020 2:45 PM Medical Record Number: [id number] Patient Account Number: [id number] Date of Birth/Sex: 08-10-1955 (65 y.o. M) Treating RN: Donnamarie Poag Primary Care Jonaven Hilgers: Frazier Richards Other Clinician: Jeanine Luz Referring Alexxus Sobh: Frazier Richards Treating Amaiah Cristiano/Extender: Skipper Cliche in Treatment: 7 Visit Information History Since Last Visit Added or deleted any medications: No Patient Arrived: Wheel Chair Had a fall or experienced change in No Arrival Time: 14:53 activities of daily living that may affect Accompanied By: niece risk of falls: Transfer Assistance: None Hospitalized since last visit: No Patient Identification Verified: Yes Has Dressing in Place as Prescribed: Yes Secondary Verification Process Completed: Yes Pain Present Now: No Patient Requires Transmission-Based Precautions: No Patient Has Alerts: Yes Patient Alerts: Type II Diabetic Electronic Signature(s) Signed: 06/12/2020 3:53:44 PM By: Donnamarie Poag Entered By: Donnamarie Poag on 06/12/2020 14:54:17 Shawn Hayes ([id number]) -------------------------------------------------------------------------------- Clinic Level of Care Assessment Details Patient Name: Shawn Hayes. Date of Service: 06/12/2020 2:45 PM Medical Record Number: [id number] Patient Account Number: [id number] Date of Birth/Sex: [birth year] (65 y.o. M) Treating RN: Carlene Coria Primary Care Obie Kallenbach: Frazier Richards Other Clinician: Jeanine Luz Referring Zarrah Loveland: Frazier Richards Treating Rhian Asebedo/Extender: Skipper Cliche in Treatment: 7 Clinic Level of Care Assessment Items TOOL 4 Quantity Score X - Use when only an EandM is performed on FOLLOW-UP visit 1 0 ASSESSMENTS - Nursing Assessment / Reassessment X - Reassessment of Co-morbidities (includes updates in  patient status) 1 10 X- 1 5 Reassessment of Adherence to Treatment Plan ASSESSMENTS - Wound and Skin Assessment / Reassessment X - Simple Wound Assessment / Reassessment - one wound 1 5 '[]'$  - 0 Complex Wound Assessment / Reassessment - multiple wounds '[]'$  - 0 Dermatologic / Skin Assessment (not related to wound area) ASSESSMENTS - Focused Assessment '[]'$  - Circumferential Edema Measurements - multi extremities 0 '[]'$  - 0 Nutritional Assessment / Counseling / Intervention '[]'$  - 0 Lower Extremity Assessment (monofilament, tuning fork, pulses) '[]'$  - 0 Peripheral Arterial Disease Assessment (using hand held doppler) ASSESSMENTS - Ostomy and/or Continence Assessment and Care '[]'$  - Incontinence Assessment and Management 0 '[]'$  - 0 Ostomy Care Assessment and Management (repouching, etc.) PROCESS - Coordination of Care X - Simple Patient / Family Education for ongoing care 1 15 '[]'$  - 0 Complex (extensive) Patient / Family Education for ongoing care '[]'$  - 0 Staff obtains Programmer, systems, Records, Test Results / Process Orders '[]'$  - 0 Staff telephones HHA, Nursing Homes / Clarify orders / etc '[]'$  - 0 Routine Transfer to another Facility (non-emergent condition) '[]'$  - 0 Routine Hospital Admission (non-emergent condition) '[]'$  - 0 New Admissions / Biomedical engineer / Ordering NPWT, Apligraf, etc. '[]'$  - 0 Emergency Hospital Admission (emergent condition) X- 1 10 Simple Discharge Coordination '[]'$  - 0 Complex (extensive) Discharge Coordination PROCESS - Special Needs '[]'$  - Pediatric / Minor Patient Management 0 '[]'$  - 0 Isolation Patient Management '[]'$  - 0 Hearing / Language / Visual special needs '[]'$  - 0 Assessment of Community assistance (transportation, D/C planning, etc.) '[]'$  - 0 Additional assistance / Altered mentation '[]'$  - 0 Support Surface(s) Assessment (bed, cushion, seat, etc.) INTERVENTIONS - Wound Cleansing / Measurement Derryberry, Caleb L. ([id number]) X- 1 5 Simple Wound Cleansing - one  wound '[]'$  - 0 Complex Wound Cleansing - multiple wounds X- 1 5 Wound Imaging (photographs - any number of wounds) '[]'$  - 0 Wound Tracing (instead of photographs) X- 1 5 Simple  Wound Measurement - one wound '[]'$  - 0 Complex Wound Measurement - multiple wounds INTERVENTIONS - Wound Dressings '[]'$  - Small Wound Dressing one or multiple wounds 0 '[]'$  - 0 Medium Wound Dressing one or multiple wounds '[]'$  - 0 Large Wound Dressing one or multiple wounds '[]'$  - 0 Application of Medications - topical '[]'$  - 0 Application of Medications - injection INTERVENTIONS - Miscellaneous '[]'$  - External ear exam 0 '[]'$  - 0 Specimen Collection (cultures, biopsies, blood, body fluids, etc.) '[]'$  - 0 Specimen(s) / Culture(s) sent or taken to Lab for analysis '[]'$  - 0 Patient Transfer (multiple staff / Civil Service fast streamer / Similar devices) '[]'$  - 0 Simple Staple / Suture removal (25 or less) '[]'$  - 0 Complex Staple / Suture removal (26 or more) '[]'$  - 0 Hypo / Hyperglycemic Management (close monitor of Blood Glucose) '[]'$  - 0 Ankle / Brachial Index (ABI) - do not check if billed separately X- 1 5 Vital Signs Has the patient been seen at the hospital within the last three years: Yes Total Score: 65 Level Of Care: New/Established - Level 2 Electronic Signature(s) Signed: 06/15/2020 7:55:11 AM By: Carlene Coria RN Entered By: Carlene Coria on 06/12/2020 15:40:36 Shawn Hayes ([id number]) -------------------------------------------------------------------------------- Encounter Discharge Information Details Patient Name: Shawn Hayes. Date of Service: 06/12/2020 2:45 PM Medical Record Number: [id number] Patient Account Number: [id number] Date of Birth/Sex: 25-Sep-1955 (65 y.o. M) Treating RN: Donnamarie Poag Primary Care Elianny Buxbaum: Frazier Richards Other Clinician: Jeanine Luz Referring Destan Franchini: Frazier Richards Treating Sota Hetz/Extender: Skipper Cliche in Treatment: 7 Encounter Discharge Information  Items Discharge Condition: Stable Ambulatory Status: Wheelchair Discharge Destination: Home Transportation: Private Auto Accompanied By: niece Schedule Follow-up Appointment: Yes Clinical Summary of Care: Electronic Signature(s) Signed: 06/12/2020 3:53:44 PM By: Donnamarie Poag Entered By: Donnamarie Poag on 06/12/2020 15:46:51 Shawn Hayes ([id number]) -------------------------------------------------------------------------------- Lower Extremity Assessment Details Patient Name: Shawn Hayes. Date of Service: 06/12/2020 2:45 PM Medical Record Number: [id number] Patient Account Number: [id number] Date of Birth/Sex: [birth year] (65 y.o. M) Treating RN: Donnamarie Poag Primary Care Adam Sanjuan: Frazier Richards Other Clinician: Jeanine Luz Referring Sierra Spargo: Frazier Richards Treating Satrina Magallanes/Extender: Jeri Cos Weeks in Treatment: 7 Edema Assessment Assessed: [Left: Yes] [Right: No] [Left: Edema] [Right: :] Vascular Assessment Pulses: Dorsalis Pedis Palpable: [Left:Yes] Electronic Signature(s) Signed: 06/12/2020 3:53:44 PM By: Donnamarie Poag Entered By: Donnamarie Poag on 06/12/2020 15:02:56 Shawn Hayes ([id number]) -------------------------------------------------------------------------------- Multi Wound Chart Details Patient Name: Shawn Hayes. Date of Service: 06/12/2020 2:45 PM Medical Record Number: [id number] Patient Account Number: [id number] Date of Birth/Sex: October 26, 1955 (65 y.o. M) Treating RN: Carlene Coria Primary Care Sandee Bernath: Frazier Richards Other Clinician: Jeanine Luz Referring Janellie Tennison: Frazier Richards Treating Madilynn Montante/Extender: Skipper Cliche in Treatment: 7 Vital Signs Height(in): Pulse(bpm): 84 Weight(lbs): Blood Pressure(mmHg): 131/67 Body Mass Index(BMI): Temperature(F): 98.0 Respiratory Rate(breaths/min): 18 Photos: [N/A:N/A] Wound Location: Left, Lateral Amputation Site - N/A N/A Transmetatarsal Wounding Event: Skin  Tear/Laceration N/A N/A Primary Etiology: Skin Tear N/A N/A Comorbid History: Congestive Heart Failure, Type II N/A N/A Diabetes Date Acquired: 05/27/2020 N/A N/A Weeks of Treatment: 2 N/A N/A Wound Status: Open N/A N/A Measurements L x W x D (cm) 0.1x0.1x0.1 N/A N/A Area (cm) : 0.008 N/A N/A Volume (cm) : 0.001 N/A N/A % Reduction in Area: 99.20% N/A N/A % Reduction in Volume: 99.00% N/A N/A Classification: Full Thickness Without Exposed N/A N/A Support Structures Exudate Amount: Small N/A N/A Exudate Type: Serous N/A N/A Exudate Color: amber N/A N/A Granulation Amount: None Present (0%) N/A N/A Necrotic Amount:  Small (1-33%) N/A N/A Necrotic Tissue: Eschar N/A N/A Exposed Structures: Fat Layer (Subcutaneous Tissue): N/A N/A Yes Fascia: No Tendon: No Muscle: No Joint: No Bone: No Epithelialization: None N/A N/A Treatment Notes Electronic Signature(s) Signed: 06/15/2020 7:55:11 AM By: Carlene Coria RN Entered By: Carlene Coria on 06/12/2020 15:39:50 Shawn Hayes ([id number]) -------------------------------------------------------------------------------- [place] Details Patient Name: Shawn Hayes. Date of Service: 06/12/2020 2:45 PM Medical Record Number: [id number] Patient Account Number: [id number] Date of Birth/Sex: 1955/10/24 (65 y.o. M) Treating RN: Carlene Coria Primary Care Isam Unrein: Frazier Richards Other Clinician: Jeanine Luz Referring Skyley Grandmaison: Frazier Richards Treating Rashaunda Rahl/Extender: Jeri Cos Weeks in Treatment: 7 Active Inactive Electronic Signature(s) Signed: 06/15/2020 7:55:11 AM By: Carlene Coria RN Entered By: Carlene Coria on 06/12/2020 15:39:40 Shawn Hayes ([id number]) -------------------------------------------------------------------------------- Pain Assessment Details Patient Name: Shawn Hayes. Date of Service: 06/12/2020 2:45 PM Medical Record Number: [id number] Patient Account Number:  [id number] Date of Birth/Sex: 1955-05-19 (65 y.o. M) Treating RN: Donnamarie Poag Primary Care Rayshell Goecke: Frazier Richards Other Clinician: Jeanine Luz Referring Careem Yasui: Frazier Richards Treating Mignonne Afonso/Extender: Skipper Cliche in Treatment: 7 Active Problems Location of Pain Severity and Description of Pain Patient Has Paino No Site Locations Rate the pain. Current Pain Level: 0 Pain Management and Medication Current Pain Management: Electronic Signature(s) Signed: 06/12/2020 3:53:44 PM By: Donnamarie Poag Entered By: Donnamarie Poag on 06/12/2020 14:57:08 Shawn Hayes ([id number]) -------------------------------------------------------------------------------- Patient/Caregiver Education Details Patient Name: Shawn Hayes Date of Service: 06/12/2020 2:45 PM Medical Record Number: [id number] Patient Account Number: [id number] Date of Birth/Gender: 08-03-1955 (65 y.o. M) Treating RN: Carlene Coria Primary Care Physician: Frazier Richards Other Clinician: Jeanine Luz Referring Physician: Frazier Richards Treating Physician/Extender: Skipper Cliche in Treatment: 7 Education Assessment Education Provided To: Patient Education Topics Provided Wound/Skin Impairment: Methods: Explain/Verbal Responses: State content correctly Electronic Signature(s) Signed: 06/15/2020 7:55:11 AM By: Carlene Coria RN Entered By: Carlene Coria on 06/12/2020 15:40:50 Shawn Hayes ([id number]) -------------------------------------------------------------------------------- Wound Assessment Details Patient Name: Shawn Hayes. Date of Service: 06/12/2020 2:45 PM Medical Record Number: [id number] Patient Account Number: [id number] Date of Birth/Sex: 1956/02/02 (65 y.o. M) Treating RN: Carlene Coria Primary Care Anamaria Dusenbury: Frazier Richards Other Clinician: Jeanine Luz Referring Ayomide Purdy: Frazier Richards Treating Geoffrey Mankin/Extender: Skipper Cliche in Treatment:  7 Wound Status Wound Number: 4 Primary Etiology: Skin Tear Wound Location: Left, Lateral Amputation Site - Transmetatarsal Wound Status: Open Wounding Event: Skin Tear/Laceration Comorbid History: Congestive Heart Failure, Type II Diabetes Date Acquired: 05/27/2020 Weeks Of Treatment: 2 Clustered Wound: No Photos Wound Measurements Length: (cm) 0 Width: (cm) 0 Depth: (cm) 0 Area: (cm) Volume: (cm) % Reduction in Area: 100% % Reduction in Volume: 100% Epithelialization: Large (67-100%) 0 Tunneling: No 0 Undermining: No Wound Description Classification: Full Thickness Without Exposed Support Structures Exudate Amount: None Present Foul Odor After Cleansing: No Slough/Fibrino No Wound Bed Granulation Amount: None Present (0%) Exposed Structure Necrotic Amount: None Present (0%) Fascia Exposed: No Fat Layer (Subcutaneous Tissue) Exposed: No Tendon Exposed: No Muscle Exposed: No Joint Exposed: No Bone Exposed: No Electronic Signature(s) Signed: 06/15/2020 7:55:11 AM By: Carlene Coria RN Entered By: Carlene Coria on 06/12/2020 15:41:59 Shawn Hayes ([id number]) -------------------------------------------------------------------------------- [place] Details Patient Name: Shawn Hayes. Date of Service: 06/12/2020 2:45 PM Medical Record Number: [id number] Patient Account Number: [id number] Date of Birth/Sex: 01/19/1956 (65 y.o. M) Treating RN: Donnamarie Poag Primary Care Marquies Wanat: Frazier Richards Other Clinician: Jeanine Luz Referring Donaven Criswell: Frazier Richards Treating Faelyn Sigler/Extender: Skipper Cliche in Treatment: 7 Vital Signs Time Taken:  14:55 Temperature (F): 98.0 Pulse (bpm): 77 Respiratory Rate (breaths/min): 18 Blood Pressure (mmHg): 131/67 Reference Range: 80 - 120 mg / dl Electronic Signature(s) Signed: 06/12/2020 3:53:44 PM By: Donnamarie Poag Entered ByDonnamarie Poag on 06/12/2020 14:57:00

## 2020-06-12 NOTE — Progress Notes (Addendum)
Shawn, Hayes ([id number]) Visit Report for 06/12/2020 Chief Complaint Document Details Patient Name: Shawn Hayes, Shawn Hayes. Date of Service: 06/12/2020 2:45 PM Medical Record Number: [id number] Patient Account Number: [id number] Date of Birth/Sex: 20-Aug-1955 (65 y.o. M) Treating RN: Carlene Coria Primary Care Provider: Frazier Richards Other Clinician: Jeanine Luz Referring Provider: Frazier Richards Treating Provider/Extender: Skipper Cliche in Treatment: 7 Information Obtained from: Patient Chief Complaint Right BKA and left foot ulcers Electronic Signature(s) Signed: 06/12/2020 2:43:37 PM By: Worthy Keeler PA-C Entered By: Worthy Keeler on 06/12/2020 14:43:37 Shawn Hayes ([id number]) -------------------------------------------------------------------------------- HPI Details Patient Name: Shawn Hayes Date of Service: 06/12/2020 2:45 PM Medical Record Number: [id number] Patient Account Number: [id number] Date of Birth/Sex: 07-22-1955 (65 y.o. M) Treating RN: Carlene Coria Primary Care Provider: Frazier Richards Other Clinician: Jeanine Luz Referring Provider: Frazier Richards Treating Provider/Extender: Skipper Cliche in Treatment: 7 History of Present Illness HPI Description: 04/24/2020 upon evaluation today patient presents for initial evaluation here in our clinic concerning issues that he has been having with wound dehiscence in regard to his below-knee amputation on the right as well as transmetatarsal amputation on the left. Fortunately this does not appear to be doing too badly which is good news. There is no signs of active infection at this time. I do think he is going require little bit of sharp debridement here but fortunately overall I think he is actually managing quite nicely in a bad situation. The initial below-knee amputation was in January on the right. His transmetatarsal amputation was in November 2021. The patient states that  obviously his lifestyle has changed dramatically over the past several months. He seems to be in fairly good spirits all things considered. He does have a history of diabetes mellitus type 2. 05/01/2020 upon evaluation today patient appears to be doing excellent in regard to his wounds. There again will require some sharp debridement to clear away some of the slough today but overall I feel like he is making good progress. Fortunately there does not appear to be any evidence of active infection which is great news. No fevers, chills, nausea, vomiting, or diarrhea. 05/07/2020 upon evaluation today patient appears to be doing well with regard to his wounds in general. All are going require little bit of debridement on the left the right side not really as much although there was a suture pushing out along one of the incision lines. Nonetheless I think this is going to come right out. Fortunately there is no evidence of infection at any site which is great news. 05/14/2020 on evaluation today patient actually appears to be making great progress. In fact his wound on the right stump is actually completely healed. The left foot amputation site has 2 small areas still open there is a lot of drainage that is going to have to be removed but seems to be doing better. 05/22/2020 upon evaluation today patient appears to be doing excellent in regard to his wounds. He does have a lot of dressing buildup on the surface of the wound 7 have to remove this in order to see where things stand. Patient will there is no evidence of active infection. 05/29/2020 upon evaluation today patient actually appears to be doing extremely well currently with regard to his wound. Is been tolerating the dressing changes without complication which is great news. There does not appear to be any signs of infection. In fact I think he may be healed except for the fact that unfortunately on the  previously healed location he saw a piece of skin  sticking up he thought it was some of the dressing he pulled on this and unfortunately pulled off some of the skin in the area where this had previously healed. Nonetheless I think that we can definitely use collagen here that is the only one remaining and that is actually new 1 that occurred as a result of the skin tearing. 06/05/2020 on evaluation today patient appears to be doing well with regard to his wound. He has been tolerating the dressing changes without complication. Fortunately there is no signs of active infection at this time. No fever chills noted. I think the wound is very close to complete resolution likely this will be healed come next week. 06/12/20 on evaluation today patient appears to be completely healed which is great news and overall extremely pleased with where things stand today. There is no sign of active infection at this time. No fevers, chills, nausea, vomiting, or diarrhea. Electronic Signature(s) Signed: 06/12/2020 3:44:12 PM By: Worthy Keeler PA-C Entered By: Worthy Keeler on 06/12/2020 15:44:12 Shawn Hayes ([id number]) -------------------------------------------------------------------------------- Physical Exam Details Patient Name: Shawn Hayes Date of Service: 06/12/2020 2:45 PM Medical Record Number: [id number] Patient Account Number: [id number] Date of Birth/Sex: 04-21-1955 (65 y.o. M) Treating RN: Carlene Coria Primary Care Provider: Frazier Richards Other Clinician: Jeanine Luz Referring Provider: Frazier Richards Treating Provider/Extender: Skipper Cliche in Treatment: 7 Constitutional Well-nourished and well-hydrated in no acute distress. Respiratory normal breathing without difficulty. Psychiatric this patient is able to make decisions and demonstrates good insight into disease process. Alert and Oriented x 3. pleasant and cooperative. Notes Patient's wound bed showed signs of complete epithelization at this time.  Fortunately there does not appear to be any signs of infection which is great news and overall very pleased with where things stand at this point. Electronic Signature(s) Signed: 06/12/2020 3:44:25 PM By: Worthy Keeler PA-C Entered By: Worthy Keeler on 06/12/2020 15:44:25 Shawn Hayes ([id number]) -------------------------------------------------------------------------------- Physician Orders Details Patient Name: Shawn Hayes Date of Service: 06/12/2020 2:45 PM Medical Record Number: [id number] Patient Account Number: [id number] Date of Birth/Sex: 11-09-1955 (65 y.o. M) Treating RN: Carlene Coria Primary Care Provider: Frazier Richards Other Clinician: Jeanine Luz Referring Provider: Frazier Richards Treating Provider/Extender: Skipper Cliche in Treatment: 7 Verbal / Phone Orders: No Diagnosis Coding ICD-10 Coding Code Description E11.621 Type 2 diabetes mellitus with foot ulcer Z89.421 Acquired absence of other right toe(s) L97.522 Non-pressure chronic ulcer of other part of left foot with fat layer exposed Z89.511 Acquired absence of right leg below knee L97.812 Non-pressure chronic ulcer of other part of right lower leg with fat layer exposed I10 Essential (primary) hypertension I50.42 Chronic combined systolic (congestive) and diastolic (congestive) heart failure J44.9 Chronic obstructive pulmonary disease, unspecified Discharge From [facility] Services o Discharge from [facility] Complete - apply lotion daily Wound Treatment Electronic Signature(s) Signed: 06/12/2020 5:20:58 PM By: Worthy Keeler PA-C Signed: 06/15/2020 7:55:11 AM By: Carlene Coria RN Entered By: Carlene Coria on 06/12/2020 15:41:13 Shawn Hayes ([id number]) -------------------------------------------------------------------------------- Problem List Details Patient Name: Shawn Hayes. Date of Service: 06/12/2020 2:45 PM Medical Record Number: [id number] Patient  Account Number: [id number] Date of Birth/Sex: 1955/08/18 (65 y.o. M) Treating RN: Carlene Coria Primary Care Provider: Frazier Richards Other Clinician: Jeanine Luz Referring Provider: Frazier Richards Treating Provider/Extender: Skipper Cliche in Treatment: 7 Active Problems ICD-10 Encounter Code Description Active Date MDM Diagnosis E11.621 Type 2 diabetes  mellitus with foot ulcer 04/24/2020 No Yes Z89.421 Acquired absence of other right toe(s) 04/24/2020 No Yes L97.522 Non-pressure chronic ulcer of other part of left foot with fat layer 04/24/2020 No Yes exposed Z89.511 Acquired absence of right leg below knee 04/24/2020 No Yes L97.812 Non-pressure chronic ulcer of other part of right lower leg with fat layer 04/24/2020 No Yes exposed I10 Essential (primary) hypertension 04/24/2020 No Yes I50.42 Chronic combined systolic (congestive) and diastolic (congestive) heart 04/24/2020 No Yes failure J44.9 Chronic obstructive pulmonary disease, unspecified 04/24/2020 No Yes Inactive Problems Resolved Problems Electronic Signature(s) Signed: 06/12/2020 2:43:30 PM By: Worthy Keeler PA-C Entered By: Worthy Keeler on 06/12/2020 14:43:30 Shawn Hayes ([id number]) -------------------------------------------------------------------------------- Progress Note Details Patient Name: Shawn Hayes. Date of Service: 06/12/2020 2:45 PM Medical Record Number: [id number] Patient Account Number: [id number] Date of Birth/Sex: Oct 29, 1955 (65 y.o. M) Treating RN: Carlene Coria Primary Care Provider: Frazier Richards Other Clinician: Jeanine Luz Referring Provider: Frazier Richards Treating Provider/Extender: Skipper Cliche in Treatment: 7 Subjective Chief Complaint Information obtained from Patient Right BKA and left foot ulcers History of Present Illness (HPI) 04/24/2020 upon evaluation today patient presents for initial evaluation here in our clinic concerning issues that he  has been having with wound dehiscence in regard to his below-knee amputation on the right as well as transmetatarsal amputation on the left. Fortunately this does not appear to be doing too badly which is good news. There is no signs of active infection at this time. I do think he is going require little bit of sharp debridement here but fortunately overall I think he is actually managing quite nicely in a bad situation. The initial below-knee amputation was in January on the right. His transmetatarsal amputation was in November 2021. The patient states that obviously his lifestyle has changed dramatically over the past several months. He seems to be in fairly good spirits all things considered. He does have a history of diabetes mellitus type 2. 05/01/2020 upon evaluation today patient appears to be doing excellent in regard to his wounds. There again will require some sharp debridement to clear away some of the slough today but overall I feel like he is making good progress. Fortunately there does not appear to be any evidence of active infection which is great news. No fevers, chills, nausea, vomiting, or diarrhea. 05/07/2020 upon evaluation today patient appears to be doing well with regard to his wounds in general. All are going require little bit of debridement on the left the right side not really as much although there was a suture pushing out along one of the incision lines. Nonetheless I think this is going to come right out. Fortunately there is no evidence of infection at any site which is great news. 05/14/2020 on evaluation today patient actually appears to be making great progress. In fact his wound on the right stump is actually completely healed. The left foot amputation site has 2 small areas still open there is a lot of drainage that is going to have to be removed but seems to be doing better. 05/22/2020 upon evaluation today patient appears to be doing excellent in regard to his  wounds. He does have a lot of dressing buildup on the surface of the wound 7 have to remove this in order to see where things stand. Patient will there is no evidence of active infection. 05/29/2020 upon evaluation today patient actually appears to be doing extremely well currently with regard to his wound. Is been tolerating  the dressing changes without complication which is great news. There does not appear to be any signs of infection. In fact I think he may be healed except for the fact that unfortunately on the previously healed location he saw a piece of skin sticking up he thought it was some of the dressing he pulled on this and unfortunately pulled off some of the skin in the area where this had previously healed. Nonetheless I think that we can definitely use collagen here that is the only one remaining and that is actually new 1 that occurred as a result of the skin tearing. 06/05/2020 on evaluation today patient appears to be doing well with regard to his wound. He has been tolerating the dressing changes without complication. Fortunately there is no signs of active infection at this time. No fever chills noted. I think the wound is very close to complete resolution likely this will be healed come next week. 06/12/20 on evaluation today patient appears to be completely healed which is great news and overall extremely pleased with where things stand today. There is no sign of active infection at this time. No fevers, chills, nausea, vomiting, or diarrhea. Objective Constitutional Well-nourished and well-hydrated in no acute distress. Vitals Time Taken: 2:55 PM, Temperature: 98.0 F, Pulse: 77 bpm, Respiratory Rate: 18 breaths/min, Blood Pressure: 131/67 mmHg. Respiratory normal breathing without difficulty. Psychiatric this patient is able to make decisions and demonstrates good insight into disease process. Alert and Oriented x 3. pleasant and cooperative. Shawn Hayes, Shawn Hayes  ([id number]) General Notes: Patient's wound bed showed signs of complete epithelization at this time. Fortunately there does not appear to be any signs of infection which is great news and overall very pleased with where things stand at this point. Integumentary (Hair, Skin) Wound #4 status is Open. Original cause of wound was Skin Tear/Laceration. The date acquired was: 05/27/2020. The wound has been in treatment 2 weeks. The wound is located on the Left,Lateral Amputation Site - Transmetatarsal. The wound measures 0cm length x 0cm width x 0cm depth; 0cm^2 area and 0cm^3 volume. There is no tunneling or undermining noted. There is a none present amount of drainage noted. There is no granulation within the wound bed. There is no necrotic tissue within the wound bed. Assessment Active Problems ICD-10 Type 2 diabetes mellitus with foot ulcer Acquired absence of other right toe(s) Non-pressure chronic ulcer of other part of left foot with fat layer exposed Acquired absence of right leg below knee Non-pressure chronic ulcer of other part of right lower leg with fat layer exposed Essential (primary) hypertension Chronic combined systolic (congestive) and diastolic (congestive) heart failure Chronic obstructive pulmonary disease, unspecified Plan Discharge From [facility] Services: Discharge from [facility] Complete - apply lotion daily 1. Would recommend that we going to discontinue wound care services as he is completely healed no dressings are needed I would recommend lotion just keep things moist as far as the newly healed areas are concerned. 2. I am also can recommend a monitor for any signs of worsening or reopening if anything occurs he should let me know sooner rather than later he voiced understanding. We will see the patient back just for follow-up visit as needed at this point. Electronic Signature(s) Signed: 06/12/2020 3:44:59 PM By: Worthy Keeler PA-C Previous Signature:  06/12/2020 3:44:47 PM Version By: Worthy Keeler PA-C Entered By: Worthy Keeler on 06/12/2020 15:44:59 Shawn Hayes ([id number]) -------------------------------------------------------------------------------- SuperBill Details Patient Name: Shawn Hayes Date  of Service: 06/12/2020 Medical Record Number: [id number] Patient Account Number: [id number] Date of Birth/Sex: [birth year] (65 y.o. M) Treating RN: Carlene Coria Primary Care Provider: Frazier Richards Other Clinician: Jeanine Luz Referring Provider: Frazier Richards Treating Provider/Extender: Skipper Cliche in Treatment: 7 Diagnosis Coding ICD-10 Codes Code Description E11.621 Type 2 diabetes mellitus with foot ulcer Z89.421 Acquired absence of other right toe(s) L97.522 Non-pressure chronic ulcer of other part of left foot with fat layer exposed Z89.511 Acquired absence of right leg below knee L97.812 Non-pressure chronic ulcer of other part of right lower leg with fat layer exposed I10 Essential (primary) hypertension I50.42 Chronic combined systolic (congestive) and diastolic (congestive) heart failure J44.9 Chronic obstructive pulmonary disease, unspecified Facility Procedures CPT4 Code: [id number] Description: [phone] - WOUND CARE VISIT-LEV 2 EST PT Modifier: Quantity: 1 Physician Procedures CPT4 Code: [id number] Description: [id number] - WC PHYS LEVEL 3 - EST PT Modifier: Quantity: 1 CPT4 Code: Description: ICD-10 Diagnosis Description E11.621 Type 2 diabetes mellitus with foot ulcer Z89.421 Acquired absence of other right toe(s) L97.522 Non-pressure chronic ulcer of other part of left foot with fat layer ex Z89.511 Acquired absence of right  leg below knee Modifier: posed Quantity: Electronic Signature(s) Signed: 06/12/2020 3:45:09 PM By: Worthy Keeler PA-C Entered By: Worthy Keeler on 06/12/2020 15:45:09

## 2020-06-15 DIAGNOSIS — Z4781 Encounter for orthopedic aftercare following surgical amputation: Secondary | ICD-10-CM | POA: Diagnosis not present

## 2020-06-15 DIAGNOSIS — Z89511 Acquired absence of right leg below knee: Secondary | ICD-10-CM | POA: Diagnosis not present

## 2020-07-13 DIAGNOSIS — Z89511 Acquired absence of right leg below knee: Secondary | ICD-10-CM | POA: Diagnosis not present

## 2020-07-15 DIAGNOSIS — Z4781 Encounter for orthopedic aftercare following surgical amputation: Secondary | ICD-10-CM | POA: Diagnosis not present

## 2020-07-15 DIAGNOSIS — Z89511 Acquired absence of right leg below knee: Secondary | ICD-10-CM | POA: Diagnosis not present

## 2020-07-17 DIAGNOSIS — Z89511 Acquired absence of right leg below knee: Secondary | ICD-10-CM | POA: Diagnosis not present

## 2020-07-17 DIAGNOSIS — Z7409 Other reduced mobility: Secondary | ICD-10-CM | POA: Diagnosis not present

## 2020-08-15 DIAGNOSIS — Z4781 Encounter for orthopedic aftercare following surgical amputation: Secondary | ICD-10-CM | POA: Diagnosis not present

## 2020-08-15 DIAGNOSIS — Z89511 Acquired absence of right leg below knee: Secondary | ICD-10-CM | POA: Diagnosis not present

## 2020-12-20 IMAGING — CR DG CHEST 2V
2 series · 2 of 2 positions shown · non-contrast
Comparison: 04/17/2019

CLINICAL DATA: Elevated BNP and renal function labs. Failed
diuresis. Shortness of breath.

EXAM:
CHEST - 2 VIEW

[chest pa]
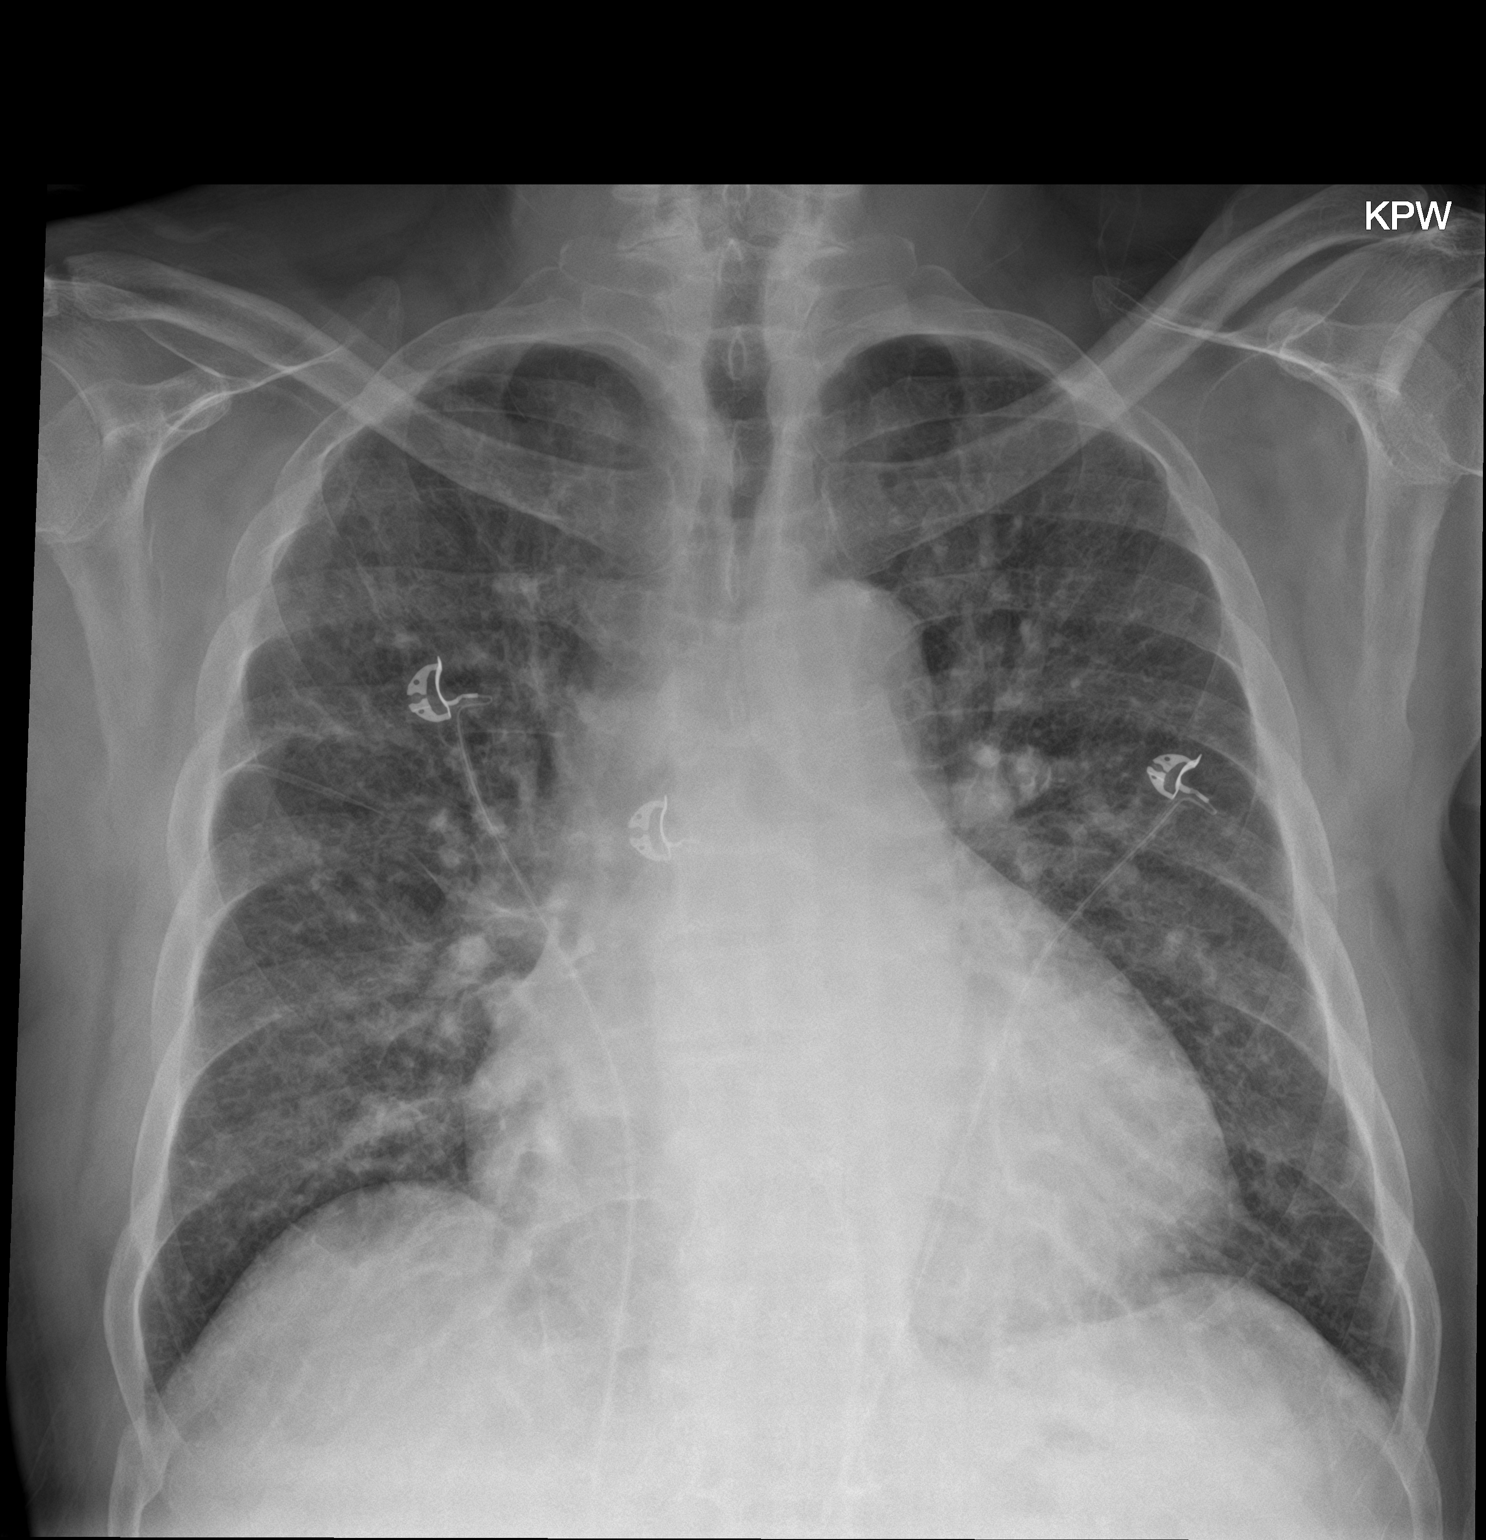

[chest lat]
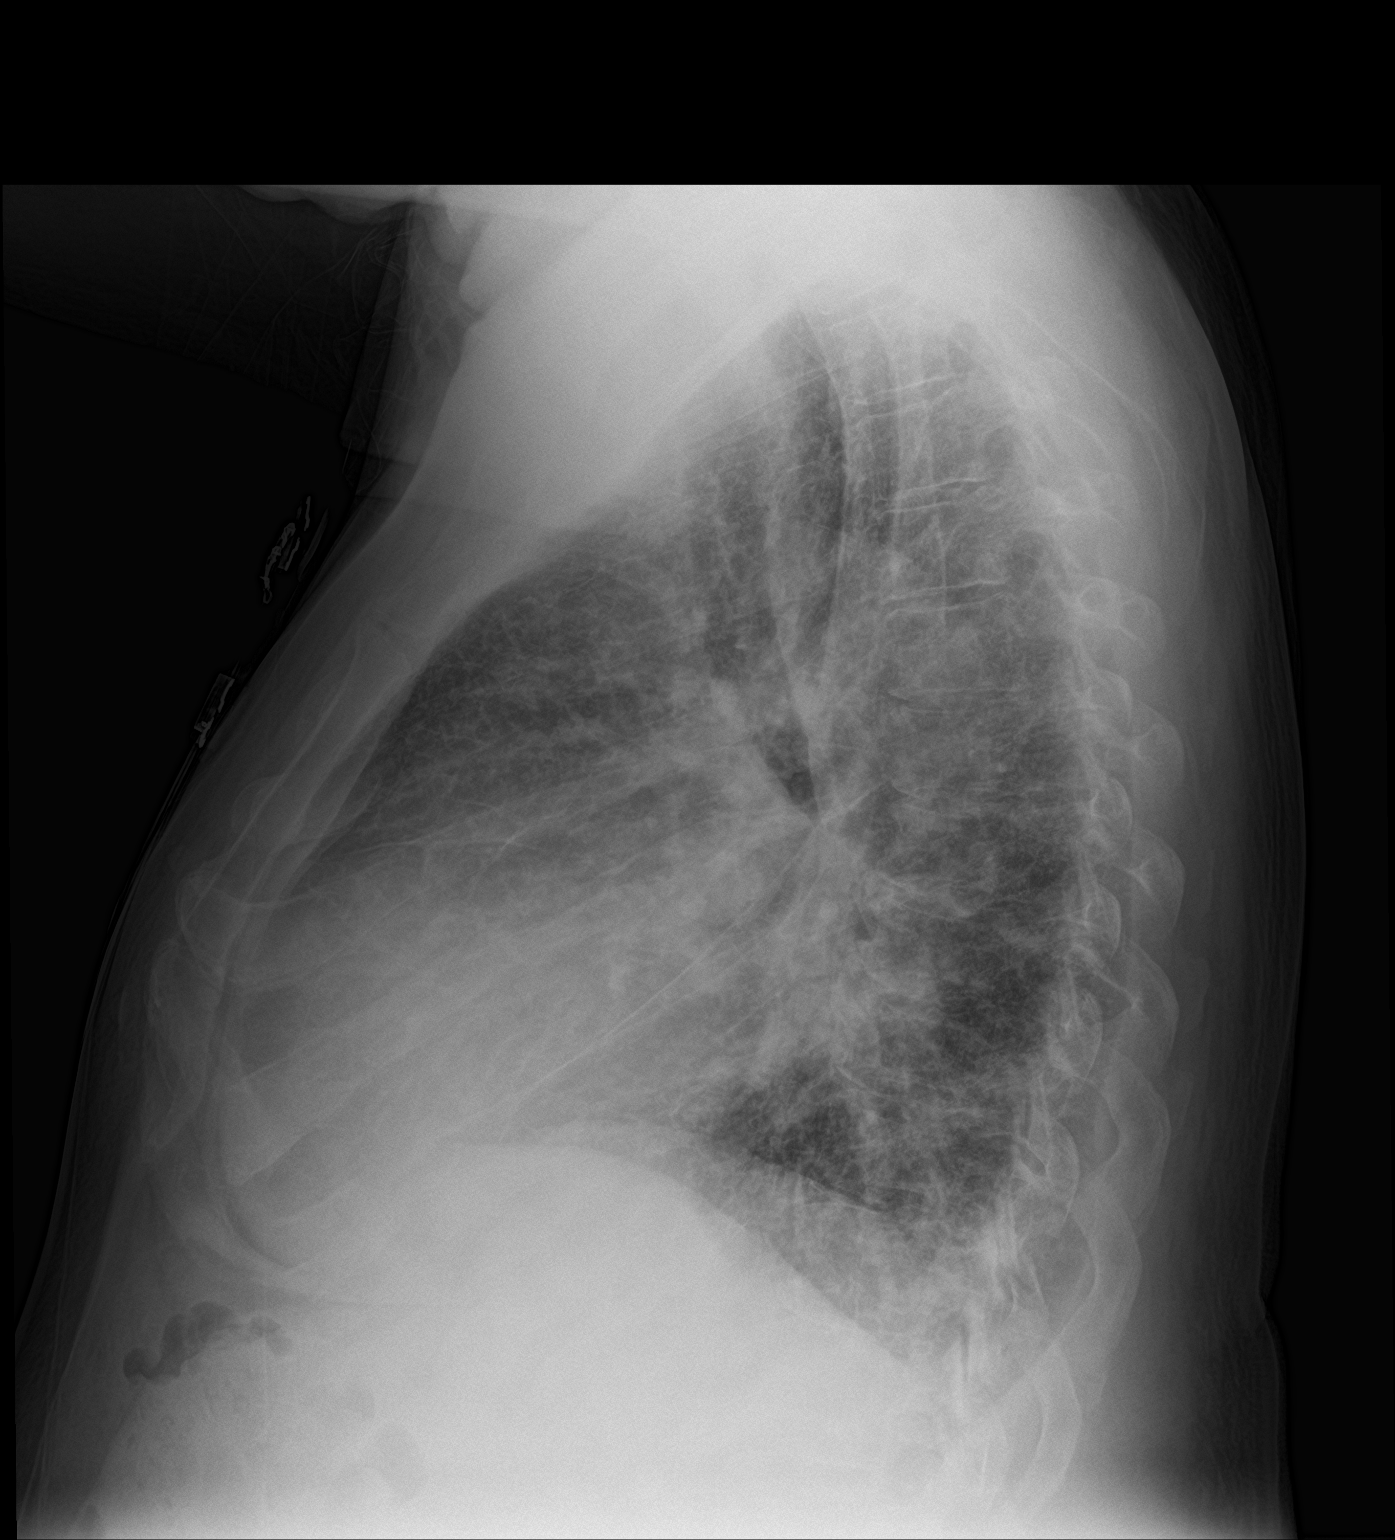

[2 of 2 positions shown; findings below may reference images not displayed]

FINDINGS: Heart is enlarged. There is pulmonary vascular congestion. Mild
interstitial edema is also present. No overt alveolar edema. No
consolidations or pleural effusions.
IMPRESSION: Cardiomegaly and mild interstitial edema.

## 2021-07-23 IMAGING — DX DG CHEST 1V PORT
1 series · 1 of 1 positions shown · non-contrast
Comparison: August 13, 2019.

CLINICAL DATA: Cough.

EXAM:
PORTABLE CHEST 1 VIEW

[chest ap]
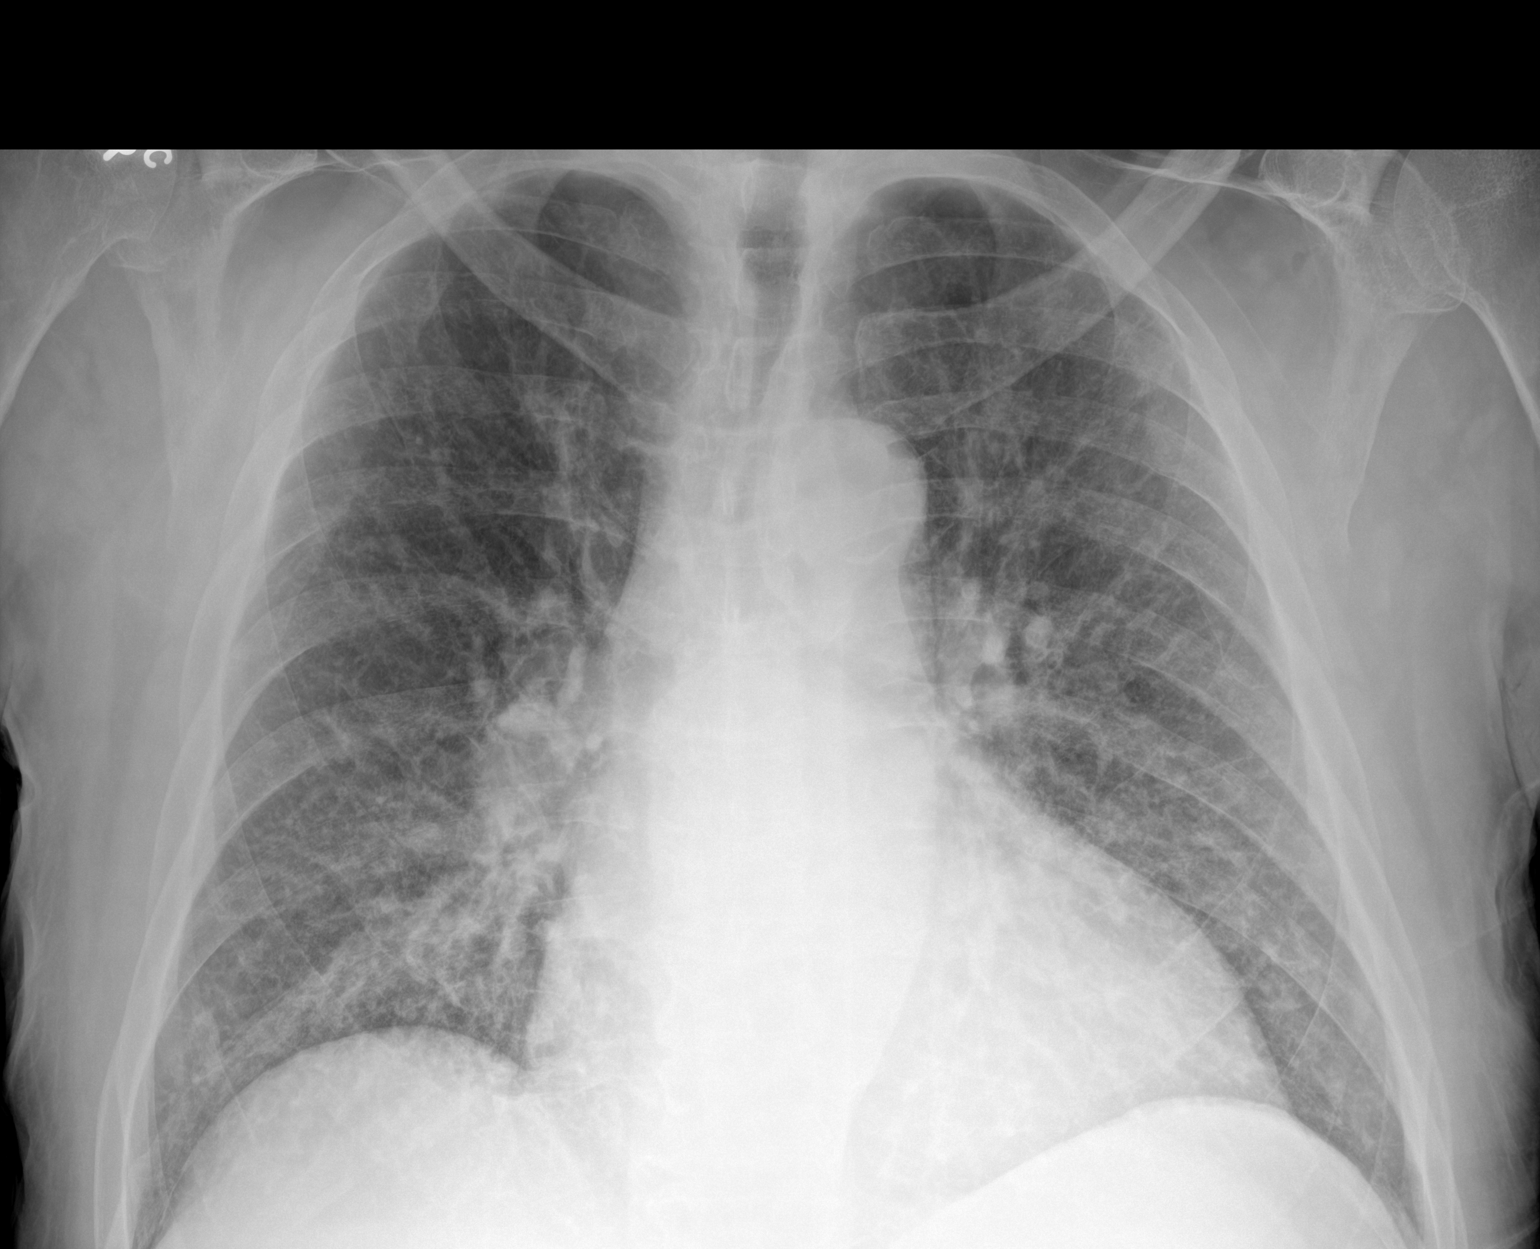

[1 of 1 positions shown; findings below may reference images not displayed]

FINDINGS: Stable cardiomegaly is noted with central pulmonary vascular
congestion. Mildly increased interstitial densities are noted in the
lungs concerning for possible pulmonary edema. No pneumothorax or
pleural effusion is noted. Bony thorax is unremarkable.
IMPRESSION: Stable cardiomegaly with central pulmonary vascular congestion.
Mildly increased interstitial densities are noted in the lungs
concerning for possible pulmonary edema.

## 2021-07-23 IMAGING — DX DG FOOT COMPLETE 3+V*R*
3 series · 3 of 3 positions shown · non-contrast
Comparison: None

CLINICAL DATA: BILATERAL foot pain since yesterday, swelling,
diabetes mellitus, ulcers LEFT foot with discharge, redness and
bleeding

EXAM:
RIGHT FOOT COMPLETE - 3+ VIEW

[foot ap]
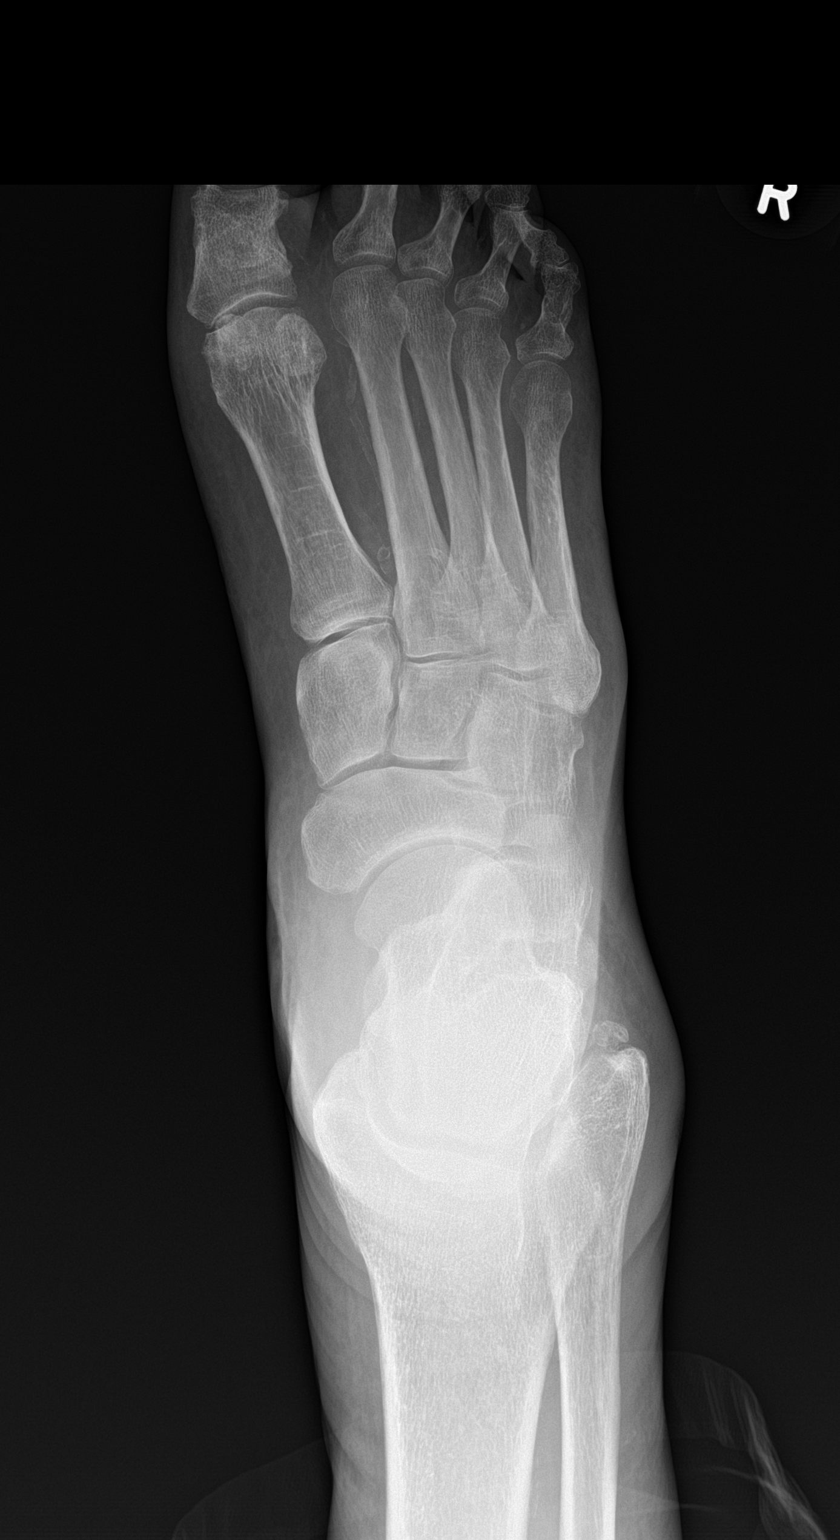

[foot obl]
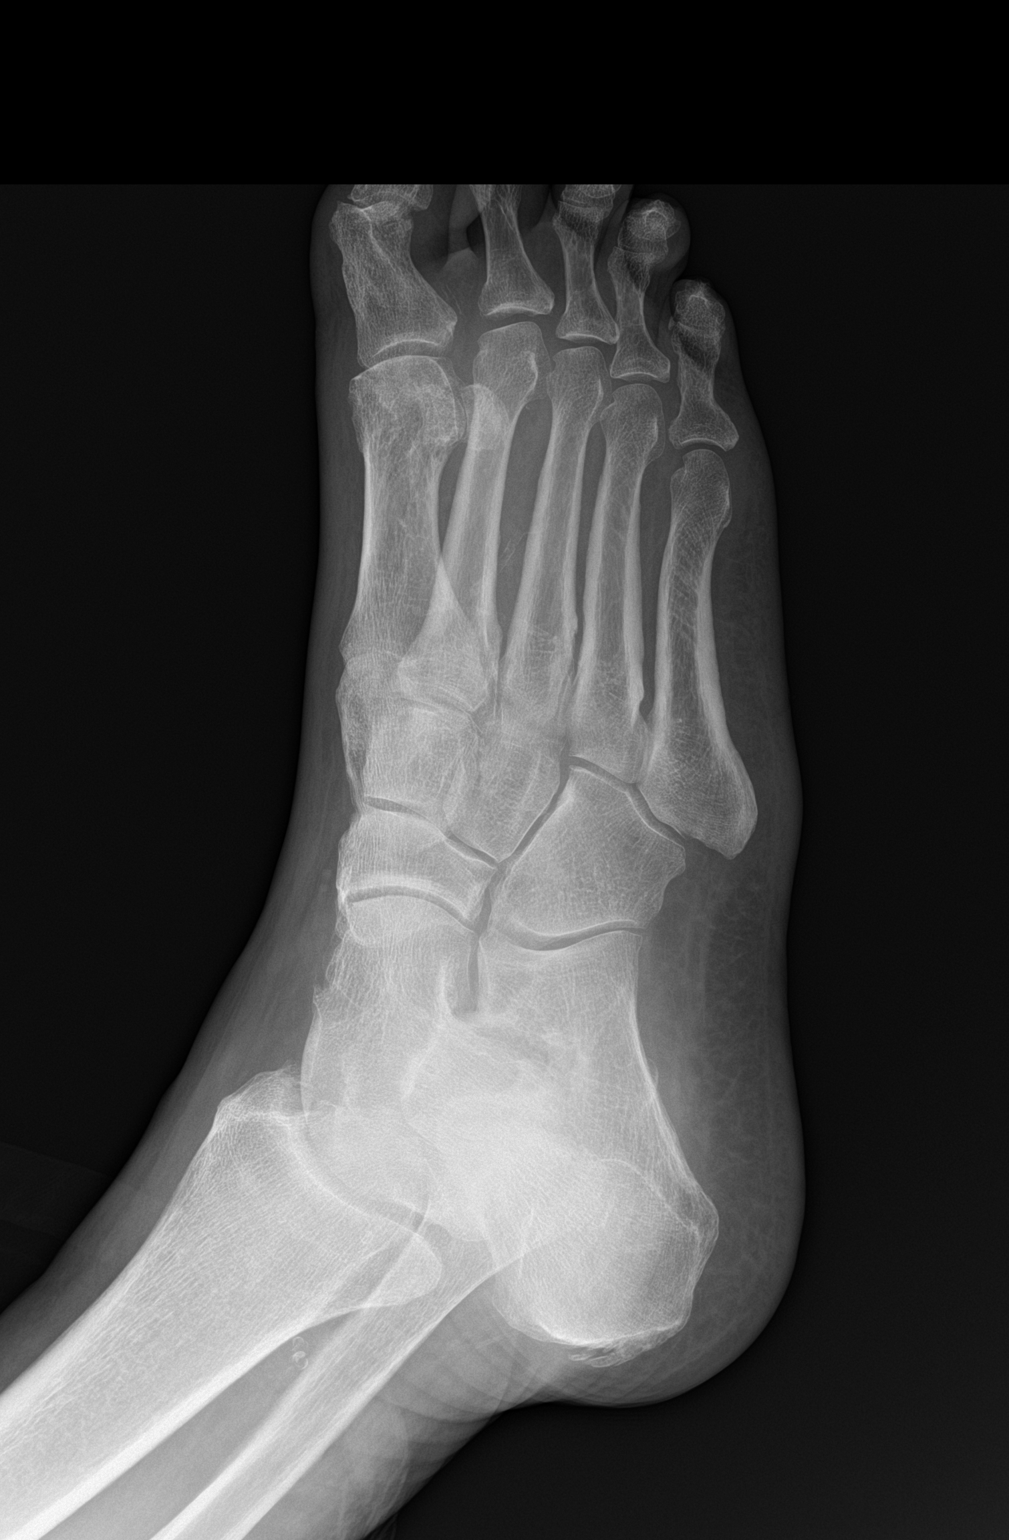

[foot lat]
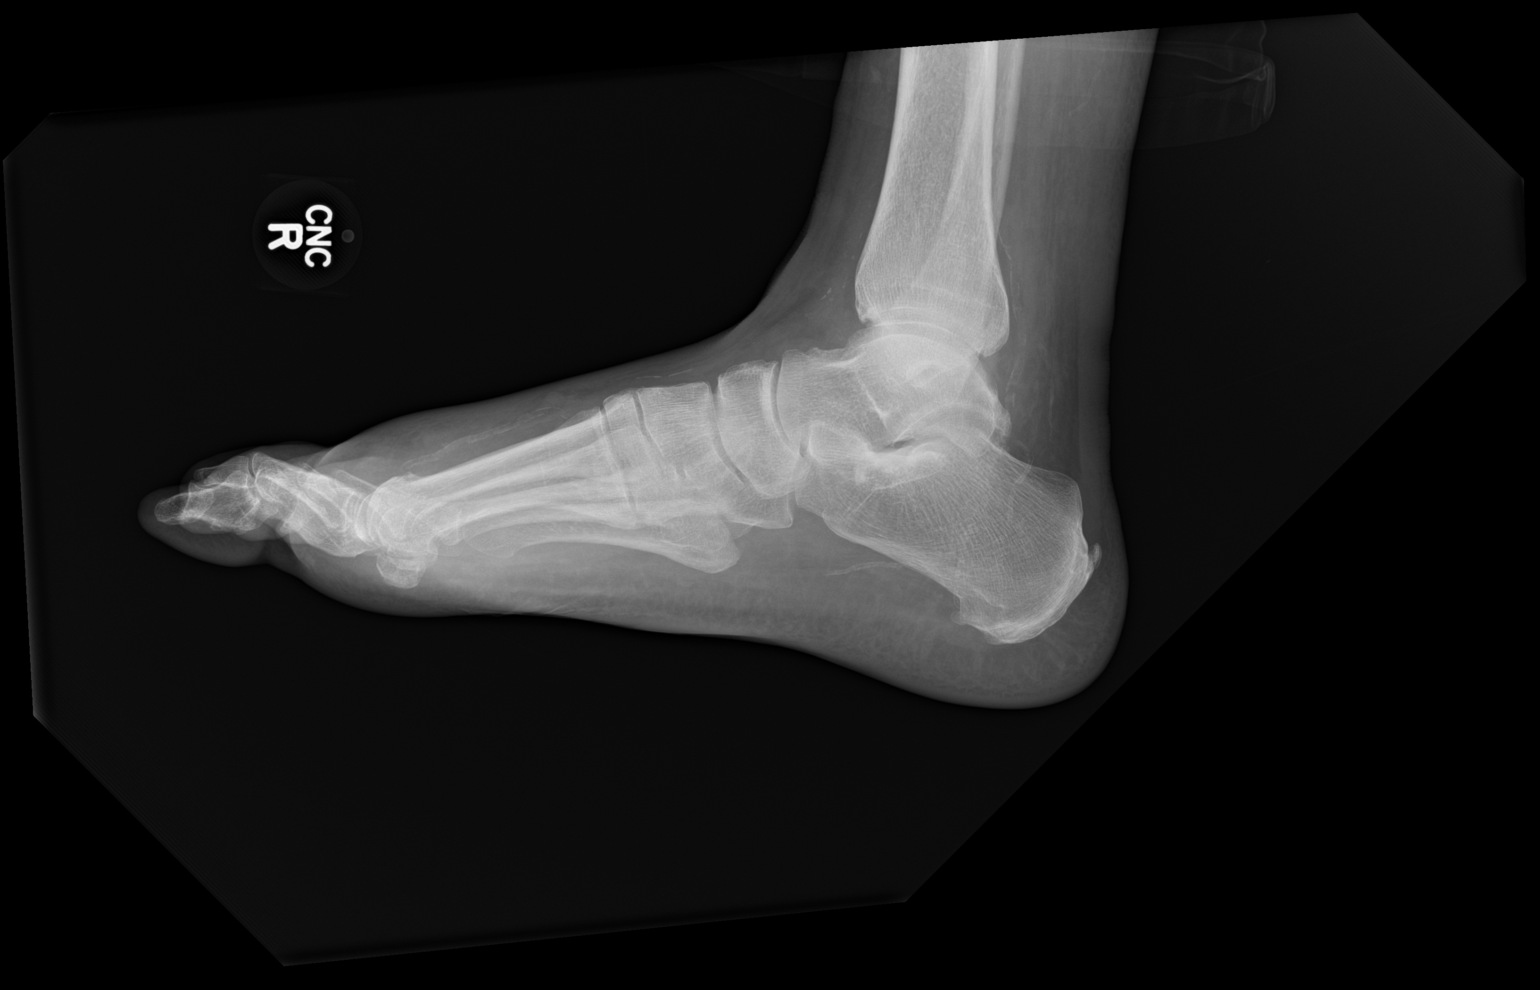

[3 of 3 positions shown; findings below may reference images not displayed]

FINDINGS: Osseous demineralization.

Small erosions at IP joint great toe.

Joint space narrowing first MCP joint.

Corticated non fused ossicle at tip of lateral malleolus.

No acute fracture, dislocation, or bone destruction.

Diffuse soft tissue swelling with scattered small vessel vascular
calcifications.

Minimal calcaneal spurring.
IMPRESSION: Degenerative changes at first MTP joint.

Small erosions at the IP joint great toe, could represent an
inflammatory arthropathy or gout.

No acute osseous abnormalities.

## 2021-11-19 ENCOUNTER — Other Ambulatory Visit: Payer: Self-pay | Admitting: Nephrology

## 2021-11-19 DIAGNOSIS — E1122 Type 2 diabetes mellitus with diabetic chronic kidney disease: Secondary | ICD-10-CM

## 2021-11-19 DIAGNOSIS — N184 Chronic kidney disease, stage 4 (severe): Secondary | ICD-10-CM

## 2022-10-03 ENCOUNTER — Other Ambulatory Visit: Payer: Self-pay | Admitting: Otolaryngology

## 2022-10-03 DIAGNOSIS — K118 Other diseases of salivary glands: Secondary | ICD-10-CM

## 2022-10-05 ENCOUNTER — Other Ambulatory Visit: Payer: Self-pay | Admitting: Otolaryngology

## 2022-10-05 ENCOUNTER — Ambulatory Visit
Admission: RE | Admit: 2022-10-05 | Discharge: 2022-10-05 | Disposition: A | Discharge status: Home / Self Care | Payer: 59 | Source: Ambulatory Visit | Attending: Otolaryngology | Admitting: Otolaryngology

## 2022-10-05 DIAGNOSIS — K118 Other diseases of salivary glands: Secondary | ICD-10-CM

## 2022-10-24 ENCOUNTER — Other Ambulatory Visit: Payer: Self-pay | Admitting: Otolaryngology

## 2022-10-24 DIAGNOSIS — K118 Other diseases of salivary glands: Secondary | ICD-10-CM

## 2022-10-26 NOTE — Progress Notes (Signed)
Berdine Dance, MD sent to [facility] for [place] core bx rt parotid mass  TS

## 2022-10-27 ENCOUNTER — Ambulatory Visit: Payer: 59 | Admitting: Physician Assistant

## 2022-10-28 ENCOUNTER — Ambulatory Visit: Payer: 59 | Admitting: Physician Assistant

## 2022-10-31 ENCOUNTER — Other Ambulatory Visit: Payer: Self-pay

## 2022-10-31 ENCOUNTER — Encounter: Payer: Self-pay | Admitting: Emergency Medicine

## 2022-10-31 ENCOUNTER — Emergency Department
Admission: EM | Admit: 2022-10-31 | Discharge: 2022-10-31 | Disposition: A | Discharge status: Home / Self Care | Payer: 59 | Source: Home / Self Care | Attending: Emergency Medicine | Admitting: Emergency Medicine

## 2022-10-31 DIAGNOSIS — I509 Heart failure, unspecified: Secondary | ICD-10-CM | POA: Diagnosis not present

## 2022-10-31 DIAGNOSIS — H5789 Other specified disorders of eye and adnexa: Secondary | ICD-10-CM | POA: Insufficient documentation

## 2022-10-31 DIAGNOSIS — J449 Chronic obstructive pulmonary disease, unspecified: Secondary | ICD-10-CM | POA: Insufficient documentation

## 2022-10-31 DIAGNOSIS — N189 Chronic kidney disease, unspecified: Secondary | ICD-10-CM | POA: Insufficient documentation

## 2022-10-31 DIAGNOSIS — I130 Hypertensive heart and chronic kidney disease with heart failure and stage 1 through stage 4 chronic kidney disease, or unspecified chronic kidney disease: Secondary | ICD-10-CM | POA: Diagnosis not present

## 2022-10-31 DIAGNOSIS — E1122 Type 2 diabetes mellitus with diabetic chronic kidney disease: Secondary | ICD-10-CM | POA: Diagnosis not present

## 2022-10-31 MED ORDER — FLUORESCEIN SODIUM 1 MG OP STRP
1.0000 | ORAL_STRIP | Freq: Once | OPHTHALMIC | Status: AC
  Administered 2022-10-31: 1 via OPHTHALMIC
  Filled 2022-10-31: qty 1

## 2022-10-31 MED ORDER — TETRACAINE HCL 0.5 % OP SOLN
1.0000 [drp] | Freq: Once | OPHTHALMIC | Status: AC
  Administered 2022-10-31: 2 [drp] via OPHTHALMIC
  Filled 2022-10-31: qty 8

## 2022-10-31 NOTE — Discharge Instructions (Addendum)
Were evaluated in the ED for eye irritation of both eyes.  Your eye exam is normal.  There is no corneal abrasion or ulceration.  The fluid inside of your eye is normal.  You will need to follow-up with your ophthalmologist who performed your surgery tomorrow.  Schedule appointment.  Continue eyedrops that were provided.

## 2022-10-31 NOTE — ED Triage Notes (Signed)
Patient c/o difficulty and pain in both eyes. Patient had cataract surgery in right eye August 9th and has been taking eye drops. States the other eye started bothering him so began using eye drops in both eyes. Reports having a burning in eye since the procedure and has not followed up with eye doctor.

## 2022-10-31 NOTE — ED Provider Notes (Signed)
[facility] Emergency Department Provider Note     Event Date/Time   First MD Initiated Contact with Patient 10/31/22 1258     (approximate)   History   Eye Problem   HPI  Shawn Hayes is a 67 y.o. male with a history of CHF, CKD, HTN, COPD and diabetes presents to the ED with complaint of bilateral eye irritation x 2 days.  Patient reports recent cataract surgery of the right eye on 10/19/2022 at the surgical eye center in [place].  Patient provided with eyedrops that is providing minimal relief.  Denies visual changes or diplopia.  Patient endorses light sensitivity.     Physical Exam   Triage Vital Signs: ED Triage Vitals  Encounter Vitals Group     BP 10/31/22 1245 (!) 127/100     Systolic BP Percentile --      Diastolic BP Percentile --      Pulse Rate 10/31/22 1245 77     Resp 10/31/22 1245 18     Temp 10/31/22 1245 98.1 F (36.7 C)     Temp Source 10/31/22 1245 Oral     SpO2 10/31/22 1245 99 %     Weight 10/31/22 1246 211 lb (95.7 kg)     Height 10/31/22 1246 6\' 1"  (1.854 m)     Head Circumference --      Peak Flow --      Pain Score --      Pain Loc --      Pain Education --      Exclude from Growth Chart --     Most recent vital signs: Vitals:   10/31/22 1245 10/31/22 1311  BP: (!) 127/100 (!) 120/90  Pulse: 77 70  Resp: 18 18  Temp: 98.1 F (36.7 C)   SpO2: 99% 99%    General Awake, no distress.  HEENT NCAT. PERRL. EOMI without pain. No rhinorrhea. Mucous membranes are moist.  CV:  Good peripheral perfusion.  RESP:  Normal effort.  ABD:  No distention.  Other:  Eye exam performed with fluorescein strip.  No fluorescein uptake bilaterally.   Tono-Pen: Right eye - Left eye - 9 mmhg  ED Results / Procedures / Treatments   Labs (all labs ordered are listed, but only abnormal results are displayed) Labs Reviewed - No data to display  No results found.  PROCEDURES:  Critical Care performed:  No  Procedures  MEDICATIONS ORDERED IN ED: Medications  tetracaine (PONTOCAINE) 0.5 % ophthalmic solution 1-2 drop (2 drops Right Eye Given by Other 10/31/22 1344)  fluorescein ophthalmic strip 1 strip (1 strip Both Eyes Given 10/31/22 1344)   IMPRESSION / MDM / ASSESSMENT AND PLAN / ED COURSE  I reviewed the triage vital signs and the nursing notes.                               67 y.o. male presents to the emergency department for evaluation and treatment of eye irritation. See HPI for further details.   Differential diagnosis includes, but is not limited to corneal abrasion, corneal ulcer, conjunctivitis, foreign body, ocular hypertension.  Patient's presentation is most consistent with acute complicated illness / injury requiring diagnostic workup.  Physical exam findings are stated as above.  Eye exam is normal.  Reassuring with tetracaine providing complete resolution.  Eyelids inverted. no foreign body noted.  intraocular pressure is normal.  Patient will be discharged  home.  He is encouraged to continue his eyedrops from his procedure and to closely follow-up with ophthalmologist this following week. Patient is given ED precautions to return to the ED for any worsening or new symptoms. Patient verbalizes understanding. All questions and concerns were addressed during ED visit.     FINAL CLINICAL IMPRESSION(S) / ED DIAGNOSES   Final diagnoses:  Eye irritation    Rx / DC Orders   ED Discharge Orders     None        Note:  This document was prepared using Dragon voice recognition software and may include unintentional dictation errors.    Romeo Apple, [place] A, PA-C 10/31/22 1950    Jene Every, MD 11/01/22 [phone]

## 2022-11-01 ENCOUNTER — Ambulatory Visit
Admission: RE | Admit: 2022-11-01 | Discharge: 2022-11-01 | Disposition: A | Discharge status: Home / Self Care | Payer: 59 | Source: Ambulatory Visit | Attending: Otolaryngology | Admitting: Otolaryngology

## 2022-11-01 DIAGNOSIS — K118 Other diseases of salivary glands: Secondary | ICD-10-CM | POA: Diagnosis present

## 2022-11-01 MED ORDER — LIDOCAINE HCL (PF) 1 % IJ SOLN
10.0000 mL | Freq: Once | INTRAMUSCULAR | Status: AC
  Administered 2022-11-01: 10 mL via SUBCUTANEOUS
  Filled 2022-11-01: qty 10

## 2022-11-01 NOTE — Procedures (Signed)
Interventional Radiology Procedure Note  Procedure: US guided biopsy of right parotid lesion.  Mx 18g core Complications: None EBL: None Specimen: To path Recommendations: - Ice prn - Routine wound care - Follow up pathology    Signed,  Gilmer Mor, DO

## 2022-11-03 ENCOUNTER — Ambulatory Visit (INDEPENDENT_AMBULATORY_CARE_PROVIDER_SITE_OTHER): Payer: 59 | Admitting: Physician Assistant

## 2022-11-03 VITALS — Ht 73.5 in | Wt 199.1 lb

## 2022-11-03 DIAGNOSIS — R339 Retention of urine, unspecified: Secondary | ICD-10-CM | POA: Diagnosis not present

## 2022-11-03 DIAGNOSIS — R3129 Other microscopic hematuria: Secondary | ICD-10-CM | POA: Diagnosis not present

## 2022-11-03 DIAGNOSIS — R972 Elevated prostate specific antigen [PSA]: Secondary | ICD-10-CM | POA: Diagnosis not present

## 2022-11-03 DIAGNOSIS — N401 Enlarged prostate with lower urinary tract symptoms: Secondary | ICD-10-CM

## 2022-11-03 LAB — BLADDER SCAN AMB NON-IMAGING: Scan Result: 211

## 2022-11-03 MED ORDER — TAMSULOSIN HCL 0.4 MG PO CAPS: 0.4000 mg | ORAL_CAPSULE | Freq: Every day | ORAL | 11 refills | Status: DC

## 2022-11-03 NOTE — Progress Notes (Signed)
11/03/2022 2:46 PM   Shawn Hayes 02/16/1956 [id number]  CC: Chief Complaint  Patient presents with   Elevated PSA   HPI: Shawn Hayes is a 67 y.o. male with PMH CHF, COPD, CKD, DM2, BPH with approximate 89g prostate on cysto with Dr. Apolinar Junes on 10/15/2019, and high risk hematuria with benign cystoscopy who presents today for evaluation of elevated PSA.   Today he reports he is no longer taking Flomax but is on torsemide and he thinks this is helping his urinary symptoms.  He continues to experience weak stream and feels that he urinates "too much."  He describes daytime frequency x 3-4, nocturia x 2, rare urge incontinence, but denies urgency.  He denies gross hematuria or dysuria.  He declines DRE today, stating Dr. Dareen Piano recently did 1 and he has already been checked for 3 other types of cancer this year so he does not think it is necessary.  PSA history as below: 09/26/2019: 2.4 10/13/2021: 3.48 10/18/2022: 4.20  In-office UA today positive for 1+ glucose, 1+ blood, and 3+ protein; urine microscopy with 3-10 RBCs/HPF. PVR .  PMH: Past Medical History:  Diagnosis Date   Anemia    BPH (benign prostatic hyperplasia)    CHF (congestive heart failure) (HCC)    Chronic kidney disease    Controlled type 2 diabetes mellitus without complication (HCC)    COPD (chronic obstructive pulmonary disease) (HCC)    Hypertension    Idiopathic cardiomyopathy (HCC)     Surgical History: Past Surgical History:  Procedure Laterality Date   APPENDECTOMY     LOWER EXTREMITY ANGIOGRAPHY Right 01/29/2020   Procedure: Lower Extremity Angiography;  Surgeon: Annice Needy, MD;  Location: [facility] INVASIVE CV [facility];  Service: Cardiovascular;  Laterality: Right;   TONSILLECTOMY      Home Medications:  Allergies as of 11/03/2022   No Known Allergies      Medication List        Accurate as of November 03, 2022  2:46 PM. If you have any questions, ask your nurse or doctor.           albuterol 108 (90 Base) MCG/ACT inhaler Commonly known as: VENTOLIN HFA Inhale 2 puffs into the lungs every 6 (six) hours as needed for wheezing or shortness of breath.   carvedilol 12.5 MG tablet Commonly known as: COREG Take 1 tablet (12.5 mg total) by mouth 2 (two) times daily with a meal. What changed: how much to take   Entresto 24-26 MG Generic drug: sacubitril-valsartan Take 1 tablet by mouth 2 (two) times daily.   Fish Oil 1000 MG Caps Take 1 g by mouth daily.   multivitamin with minerals Tabs tablet Take 1 tablet by mouth daily.   piperacillin-tazobactam 3.375 GM/50ML IVPB Commonly known as: ZOSYN Inject 50 mLs (3.375 g total) into the vein every 8 (eight) hours.   silver sulfADIAZINE 1 % cream Commonly known as: SILVADENE Apply topically daily.   torsemide 20 MG tablet Commonly known as: Demadex Take 2 tablets (40 mg total) by mouth daily. What changed: how much to take   vancomycin 750 MG/150ML Soln Commonly known as: VANCOREADY Inject 150 mLs (750 mg total) into the vein daily.        Allergies:  No Known Allergies  Family History: Family History  Problem Relation Age of Onset   Cancer Brother    Kidney failure Brother    Kidney cancer Neg Hx    Bladder Cancer Neg Hx  Prostate cancer Neg Hx     Social History:   reports that he has quit smoking. His smoking use included cigarettes. He has never used smokeless tobacco. He reports that he does not currently use alcohol. He reports that he does not currently use drugs.  Physical Exam: Ht 6' 1.5" (1.867 m)   Wt 199 lb 2 oz (90.3 kg)   BMI 25.92 kg/m   Constitutional:  Alert and oriented, no acute distress, nontoxic appearing HEENT: [place], AT Cardiovascular: No clubbing, cyanosis, or edema Respiratory: Normal respiratory effort, no increased work of breathing Skin: No rashes, bruises or suspicious lesions Neurologic: Grossly intact, no focal deficits, moving all 4 extremities Psychiatric:  Normal mood and affect  Laboratory Data: Results for orders placed or performed in visit on 11/03/22  Microscopic Examination   Urine  Result Value Ref Range   WBC, UA 0-5 0 - 5 /hpf   RBC, Urine 3-10 (A) 0 - 2 /hpf   Epithelial Cells (non renal) 0-10 0 - 10 /hpf   Bacteria, UA Few None seen/Few  Urinalysis, Complete  Result Value Ref Range   Specific Gravity, UA 1.025 1.005 - 1.030   pH, UA 6.5 5.0 - 7.5   Color, UA Yellow Yellow   Appearance Ur Clear Clear   Leukocytes,UA Negative Negative   Protein,UA 3+ (A) Negative/Trace   Glucose, UA 1+ (A) Negative   Ketones, UA Negative Negative   RBC, UA 1+ (A) Negative   Bilirubin, UA Negative Negative   Urobilinogen, Ur 0.2 0.2 - 1.0 mg/dL   Nitrite, UA Negative Negative   Microscopic Examination See below:   Bladder Scan (Post Void Residual) in office  Result Value Ref Range   Scan Result 211 ml    Assessment & Plan:   1. Elevated PSA PSA rising, though PSA density is rather reassuring.  He declined DRE today, even after I explained to notify palpated any abnormalities, this would prompt biopsy.  I think in light of his multiple medical comorbidities, this is reasonable.  Will plan for repeat PSA and we will reattempt a DRE in 6 months.  He is in agreement with this plan. - Urinalysis, Complete - Bladder Scan (Post Void Residual) in office  2. Microscopic hematuria Stable on UA today.  He denies gross hematuria.  Will continue to monitor.  Would recommend repeat hematuria workup in the next 1 to 2 years.  3. Benign prostatic hyperplasia with incomplete bladder emptying Off pharmacotherapy.  I attempted to explain that torsemide does not act on the prostate, but he was rather sure that it is helping his symptoms.  He did ultimately agree to resume Flomax.  I am hoping that by maximizing his emptying, his voiding symptoms might improve. - tamsulosin (FLOMAX) 0.4 MG CAPS capsule; Take 1 capsule (0.4 mg total) by mouth daily.   Dispense: 30 capsule; Refill: 11   Return in about 6 months (around 05/03/2023) for IPSS/PVR/DRE with Dr. Apolinar Junes.  Carman Ching, PA-C  [facility] [address] [place], [place] [address] [phone]

## 2022-11-04 LAB — MICROSCOPIC EXAMINATION

## 2022-11-04 LAB — URINALYSIS, COMPLETE
Bilirubin, UA: NEGATIVE
Ketones, UA: NEGATIVE
Leukocytes,UA: NEGATIVE
Nitrite, UA: NEGATIVE
Specific Gravity, UA: 1.025 (ref 1.005–1.030)
Urobilinogen, Ur: 0.2 mg/dL (ref 0.2–1.0)
pH, UA: 6.5 (ref 5.0–7.5)

## 2023-05-02 ENCOUNTER — Ambulatory Visit: Payer: Self-pay | Admitting: Urology

## 2023-06-14 ENCOUNTER — Ambulatory Visit: Admitting: Urology

## 2023-11-02 NOTE — Progress Notes (Signed)
 MEDICARE WELLNESS VISIT   PROVIDERS RENDERING CARE Dr. Lenon, Heart failure clinic  FUNCTIONAL ASSESSMENT  (1) Hearing: Demonstrates normal hearing in conversation.  (2) Risk of Falls: No reports of falls or abnormal balance. Gait is observed to be good upon observation.  (3) Home Safety; Home is safe and secure (4) Activities of Daily Living; Household chores and grooming are managed without problems. Personal finances are managed without problems.   DEPRESSION SCREENING There does not seem to be loss of interest in activities nor excess crying or changes in sleep or appetite.   COGNITIVE SCREENING Orientation is appropriate as are responses to questions and general conversation. No reports of forgetfulness or losing things.    PREVENTION PLAN Cardiovascular: cholesterol followed and treated  Diabetes: Has disease  Colon Cancer: Hemoccults 2021 and 2022, given health currently aggressive screening isn't warranted  Glaucoma: Yearly eye exam Pneumonia; prevnar 20 in 3-22 Shingles: discussed shingrix 8-23 Influenza: Yearly flu vaccine Smoking Cessation: 2-21 quit and discussed ct screening but with other severe issues risk benefit likely isn't positive    OTHER PERSONALIZED HEALTH ADVISE Goals     . Quit smoking / using tobacco       Continued tobacco non use and activity as tolerated with a diabetic diet  END OF LIFE CARE WANTS Full code        Layman Lenon MD     [facility] 2/9 last 3 flowsheet values     10/13/2021 10/18/2022 11/02/2023  PHQ-9 Depression Screening   Little interest or pleasure in doing things  0 0  Feeling down, depressed, or hopeless  0 0  (OBSOLETE) Little interest or pleasure in doing things 0    (OBSOLETE) Feeling down, depressed, or hopeless (or irritable for Teens only)? 0    (OBSOLETE) Total Score = 0        Depression Severity and Treatment Recommendations:  0-4= None  5-9= Mild / Treatment: Support, educate to call if worse; return  in one month  10-14= Moderate / Treatment: Support, watchful waiting; Antidepressant or Psychotherapy  15-19= Moderately severe / Treatment: Antidepressant OR Psychotherapy  >= 20 = Major depression, severe / Antidepressant AND Psychotherapy  Please note approximately 15 minutes was spent and depression screening by me and nursing staff.     We discussed an exercise program and its benefits as well as a healthy diet including healthy fats and vegetables. These are very important parts of healthy living and cardiovascular health. 15 minutes was devoted to this.      Shawn Hayes is a 68 y.o. male here for his annual exam with health maintenance   Chief Complaint  Annual exam   HISTORY OF PRESENT ILLNESS  Chronic bronchitis (CMS-HCC) Breathing has been essentially at baseline without a recent flair being noted.   DM type 2 causing CKD stage 4 (CMS/HHS-HCC) Following a diabetic diet and medications are tolerated as appropriate.  Ckd too severe for jardiance  Essential hypertension Taking medications without noted side effects or dizziness.    HFrEF (heart failure with reduced ejection fraction) (CMS-HCC) Edema is controlled at baseline and sob is not worsening.     Review of Systems; Constitutional; No weight loss, fever, chills, weakness  HEENT: No visual loss, blurred vision, hearing loss, ear pain, runny nose or sore throat SKIN; No rash or itching CARDIOVASCULAR; No chest pain, pressure. No palpitations or edema RESPIRATORY; No shortness of breath, cough or sputum GASTROINTESTINAL; No nausea, vomiting, diarrhea or dysphagia GENITOURINARY; No dysuria, new  incontinence, suprapubic pain NEUROLOGICAL; No headache, dizziness, syncopy, tingling MUSCULOSKELETAL; No muscle or joint pain or injuries HEMATOLOGICAL; No anemia, bleeding or abnormal bruising noted PSYCHIATRIC; No manic symptoms or severely blue mood ENDOCRINE; No sweating, temperature intolerance or polyuria,  polydipsia  Patient Active Problem List  Diagnosis  . Tobacco use  . Normocytic anemia  . Cardiomyopathy (CMS/HHS-HCC)  . Nonrheumatic mitral valve regurgitation  . Chronic bronchitis (CMS/HHS-HCC)  . Benign prostatic hyperplasia with urinary hesitancy  . DM type 2 causing CKD stage 4 (CMS/HHS-HCC)  . Essential hypertension  . RLS (restless legs syndrome)  . Aortic atherosclerosis ()  . HFrEF (heart failure with reduced ejection fraction) (CMS/HHS-HCC)  . Healthcare maintenance  . Hx of right BKA (CMS/HHS-HCC)    Past Medical History:  Diagnosis Date  . CHF (congestive heart failure) (CMS/HHS-HCC)   . Controlled type 2 diabetes mellitus without complication (CMS/HHS-HCC) 10/22/2014  . Tobacco use 10/22/2014    Past Surgical History:  Procedure Laterality Date  . APPENDECTOMY    . CATARACT EXTRACTION    . TONSILLECTOMY      Family History  Problem Relation Name Age of Onset  . Cancer Brother      Allergies  Allergen Reactions  . Tylenol  [Acetaminophen ] Other (See Comments)    Broke out in [place] (has since tolerated it well)      Social History   Socioeconomic History  . Marital status: Single  . Number of children: 0  Occupational History  . Occupation: retired  Tobacco Use  . Smoking status: Former    Current packs/day: 0.00    Types: Cigarettes    Quit date: 04/15/2019    Years since quitting: 4.5  . Smokeless tobacco: Never  Vaping Use  . Vaping status: Never Used  Substance and Sexual Activity  . Alcohol use: No   Social Drivers of Corporate Investment Banker Strain: Low Risk  (11/02/2023)   Overall Financial Resource Strain (CARDIA)   . Difficulty of Paying Living Expenses: Not hard at all  Food Insecurity: No Food Insecurity (11/02/2023)   Hunger Vital Sign   . Worried About Programme Researcher, Broadcasting/film/video in the Last Year: Never true   . Ran Out of Food in the Last Year: Never true  Transportation Needs: No Transportation Needs (11/02/2023)   PRAPARE -  Transportation   . Lack of Transportation (Medical): No   . Lack of Transportation (Non-Medical): No  Housing Stability: Low Risk  (11/02/2023)   Housing Stability Vital Sign   . Unable to Pay for Housing in the Last Year: No   . Number of Times Moved in the Last Year: 0   . Homeless in the Last Year: No      Current Outpatient Medications:  .  calcitRIOL (ROCALTROL) 0.25 MCG capsule, Take 0.25 mcg by mouth once daily, Disp: , Rfl:  .  carvediloL  (COREG ) 12.5 MG tablet, Take 1 tablet (12.5 mg total) by mouth 2 (two) times daily with meals, Disp: 180 tablet, Rfl: 3 .  clobetasoL (TEMOVATE) 0.05 % ointment, Apply topically 2 (two) times daily as needed, Disp: , Rfl:  .  DUPIXENT PEN 300 mg/2 mL pen injector, Inject 300 mg subcutaneously every 14 (fourteen) days, Disp: , Rfl:  .  rosuvastatin (CRESTOR) 20 MG tablet, Take 1 tablet (20 mg total) by mouth once daily, Disp: 90 tablet, Rfl: 3 .  sacubitriL -valsartan  (ENTRESTO ) 49-51 mg tablet, Take 1 tablet by mouth every 12 (twelve) hours, Disp: 180 tablet, Rfl: 3 .  semaglutide (RYBELSUS) 7 mg tablet, Take 1 tablet (7 mg total) by mouth once daily Do not cut, crush, or chew, Disp: 90 tablet, Rfl: 3 .  tamsulosin  (FLOMAX ) 0.4 mg capsule, Take 0.4 mg by mouth once daily, Disp: , Rfl:  .  TORsemide  (DEMADEX ) 100 MG tablet, Take 1 tablet (100 mg total) by mouth once daily, Disp: 90 tablet, Rfl: 3 .  traZODone (DESYREL) 50 MG tablet, Take 1 tablet (50 mg total) by mouth at bedtime, Disp: 90 tablet, Rfl: 3 .  triamcinolone 0.1 % ointment, APPLY A THIN LAYER TO RASH TWICE DAILY AS NEEDED, Disp: , Rfl:  .  amoxicillin-clavulanate (AUGMENTIN) 875-125 mg tablet, Take 1 tablet (875 mg total) by mouth every 12 (twelve) hours for 7 days, Disp: 14 tablet, Rfl: 0  Vitals:   11/02/23 1132  BP: 135/78  Pulse: 78   Body mass index is 27.14 kg/m. No acute distress, pleasant  HEENT: Normocephalic and Atraumatic, Oropharynx is clear, Tympanic membranes clear,  conjunctiva have normal color NECK: No bruits, thyromegalia or adenopathy is noted CHEST; No distress, normal to inspection, clear to auscultation CARDIOVASCULAR; Regular rate and rhythm, no murmurs rubs or gallops appreciated. Peripheral pulses were palpated and present.  ABDOMEN; Soft and flat and nontender with bowel sounds appreciated in the normal range EXTREMITIES; No clubbing cyanosis or edema, right bka noted  NEUROLOGICAL; Alert and responsive with good insight. Motor function and sensation are intact. Reflexes are present SKIN; No suspicious lesions are noted.    Office Visit on 06/30/2023  Component Date Value Ref Range Status  . Glucose 06/30/2023 191 (H)  70 - 110 mg/dL Final  . Sodium 94/97/7974 137  136 - 145 mmol/L Final  . Potassium 06/30/2023 4.7  3.6 - 5.1 mmol/L Final  . Chloride 06/30/2023 105  97 - 109 mmol/L Final  . Carbon Dioxide (CO2) 06/30/2023 22.8  22.0 - 32.0 mmol/L Final  . Calcium  06/30/2023 8.0 (L)  8.7 - 10.3 mg/dL Final  . Urea Nitrogen (BUN) 06/30/2023 35 (H)  7 - 25 mg/dL Final  . Creatinine 94/97/7974 3.2 (H)  0.7 - 1.3 mg/dL Final  . Glomerular Filtration Rate (eGFR) 06/30/2023 20 (L)  >60 mL/min/1.73sq m Final  . BUN/Crea Ratio 06/30/2023 10.9  6.0 - 20.0 Final  . Anion Gap w/K 06/30/2023 13.9  6.0 - 16.0 Final  . Hemoglobin A1C 06/30/2023 7.3 (H)  4.2 - 5.6 % Final  . Average Blood Glucose (Calc) 06/30/2023 163  mg/dL Final  . Protein, Total 06/30/2023 6.7  6.1 - 7.9 g/dL Final  . Albumin 94/97/7974 3.6  3.5 - 4.8 g/dL Final  . Bilirubin, Total 06/30/2023 0.3  0.3 - 1.2 mg/dL Final  . Bilirubin, Conjugated 06/30/2023 <0.05  0.00 - 0.20 mg/dL Final  . Alk Phos (alkaline Phosphatase) 06/30/2023 88  34 - 104 U/L Final  . AST  06/30/2023 11  8 - 39 U/L Final  . ALT  06/30/2023 11  6 - 57 U/L Final  . Cholesterol, Total 06/30/2023 147  100 - 200 mg/dL Final  . Triglyceride 94/97/7974 130  35 - 199 mg/dL Final  . HDL (High Density Lipoprotein) Cho*  06/30/2023 31.7  29.0 - 71.0 mg/dL Final  . LDL Calculated 06/30/2023 89  0 - 130 mg/dL Final  . VLDL Cholesterol 06/30/2023 26  mg/dL Final  . Cholesterol/HDL Ratio 06/30/2023 4.6   Final  . Creatinine, Random Urine 06/30/2023 61.0  40.0 - 300.0 mg/dL Final  . Urine Albumin, Random 06/30/2023  1,807    mg/L Final  . Urine Albumin/Creatinine Ratio 06/30/2023 2,962.3 (H)  <30.0 ug/mg Final  Office Visit on 02/17/2023  Component Date Value Ref Range Status  . Glucose 02/17/2023 158 (H)  70 - 110 mg/dL Final  . Sodium 87/79/7975 137  136 - 145 mmol/L Final  . Potassium 02/17/2023 5.1  3.6 - 5.1 mmol/L Final  . Chloride 02/17/2023 105  97 - 109 mmol/L Final  . Carbon Dioxide (CO2) 02/17/2023 23.0  22.0 - 32.0 mmol/L Final  . Urea Nitrogen (BUN) 02/17/2023 48 (H)  7 - 25 mg/dL Final  . Creatinine 87/79/7975 3.3 (H)  0.7 - 1.3 mg/dL Final  . Glomerular Filtration Rate (eGFR) 02/17/2023 20 (L)  >60 mL/min/1.73sq m Final  . Calcium  02/17/2023 8.3 (L)  8.7 - 10.3 mg/dL Final  . AST  87/79/7975 12  8 - 39 U/L Final  . ALT  02/17/2023 12  6 - 57 U/L Final  . Alk Phos (alkaline Phosphatase) 02/17/2023 89  34 - 104 U/L Final  . Albumin 02/17/2023 3.8  3.5 - 4.8 g/dL Final  . Bilirubin, Total 02/17/2023 0.4  0.3 - 1.2 mg/dL Final  . Protein, Total 02/17/2023 6.5  6.1 - 7.9 g/dL Final  . A/G Ratio 87/79/7975 1.4  1.0 - 5.0 gm/dL Final  . Hemoglobin J8R 02/17/2023 7.1 (H)  4.2 - 5.6 % Final  . Average Blood Glucose (Calc) 02/17/2023 157  mg/dL Final  . Cholesterol, Total 02/17/2023 163  100 - 200 mg/dL Final  . Triglyceride 87/79/7975 109  35 - 199 mg/dL Final  . HDL (High Density Lipoprotein) Cho* 02/17/2023 35.4  29.0 - 71.0 mg/dL Final  . LDL Calculated 02/17/2023 893  0 - 130 mg/dL Final  . VLDL Cholesterol 02/17/2023 22  mg/dL Final  . Cholesterol/HDL Ratio 02/17/2023 4.6   Final  . Creatinine, Random Urine 02/17/2023 48.5  40.0 - 300.0 mg/dL Final  . Urine Albumin, Random 02/17/2023 1,202     mg/L Final  . Urine Albumin/Creatinine Ratio 02/17/2023 2,478.4 (H)  <30.0 ug/mg Final    ASSESSMENT  AND PLAN:  Diagnoses and all orders for this visit:  Routine general medical examination at a health care facility  Healthcare maintenance  HFrEF (heart failure with reduced ejection fraction) (CMS/HHS-HCC) Assessment & Plan: Edema is controlled at baseline and sob is not worsening.    Orders: -     Comprehensive Metabolic Panel (CMP) -     Hemoglobin A1C -     Lipid Panel w/calc LDL -     Microalbumin/Creatinine Ratio, Random Urine -     CBC w/auto Differential (5 Part)  Hx of right BKA (CMS/HHS-HCC)  Chronic bronchitis, unspecified chronic bronchitis type (CMS/HHS-HCC) Assessment & Plan: Breathing has been essentially at baseline without a recent flair being noted.   Orders: -     Comprehensive Metabolic Panel (CMP) -     Hemoglobin A1C -     Lipid Panel w/calc LDL -     Microalbumin/Creatinine Ratio, Random Urine -     CBC w/auto Differential (5 Part)  Type 2 diabetes mellitus with stage 4 chronic kidney disease, unspecified whether long term insulin  use (CMS/HHS-HCC) Assessment & Plan: Following a diabetic diet and medications are tolerated as appropriate.  Ckd too severe for jardiance  Orders: -     Comprehensive Metabolic Panel (CMP) -     Hemoglobin A1C -     Lipid Panel w/calc LDL -     Microalbumin/Creatinine Ratio,  Random Urine -     CBC w/auto Differential (5 Part)  Depression screening (Z13.31) -     Depression Screen -(PHQ- 2/9, BDI)  Essential hypertension Assessment & Plan: Taking medications without noted side effects or dizziness.    Orders: -     Comprehensive Metabolic Panel (CMP) -     Hemoglobin A1C -     Lipid Panel w/calc LDL -     Microalbumin/Creatinine Ratio, Random Urine -     CBC w/auto Differential (5 Part)  Other orders -     amoxicillin-clavulanate (AUGMENTIN) 875-125 mg tablet; Take 1 tablet (875 mg total) by mouth every 12  (twelve) hours for 7 days      Goals     . Maintain health/healthy lifestyle        Small back abscess noted, 2-3 cm, above and let me know if not improving in a few days for ? Surgery consult

## 2024-01-29 DEATH — deceased
